# Patient Record
Sex: Male | Born: 1945
Health system: Southern US, Community
[De-identification: ages and names within clinical notes are randomized; demographics above are authoritative.]

## PROBLEM LIST (undated history)

## (undated) DIAGNOSIS — T8859XA Other complications of anesthesia, initial encounter: Secondary | ICD-10-CM

## (undated) DIAGNOSIS — C8590 Non-Hodgkin lymphoma, unspecified, unspecified site: Secondary | ICD-10-CM

## (undated) DIAGNOSIS — W3400XA Accidental discharge from unspecified firearms or gun, initial encounter: Secondary | ICD-10-CM

## (undated) DIAGNOSIS — I10 Essential (primary) hypertension: Secondary | ICD-10-CM

## (undated) DIAGNOSIS — T4145XA Adverse effect of unspecified anesthetic, initial encounter: Secondary | ICD-10-CM

## (undated) HISTORY — PX: OTHER SURGICAL HISTORY: SHX169

## (undated) HISTORY — PX: THORACENTESIS: SHX235

## (undated) HISTORY — DX: Essential (primary) hypertension: I10

---

## 2004-12-04 ENCOUNTER — Encounter: Admission: RE | Admit: 2004-12-04 | Discharge: 2004-12-04 | Payer: Self-pay | Admitting: Emergency Medicine

## 2005-11-23 ENCOUNTER — Encounter: Admission: RE | Admit: 2005-11-23 | Discharge: 2005-11-23 | Payer: Self-pay | Admitting: Family Medicine

## 2005-11-24 ENCOUNTER — Encounter (INDEPENDENT_AMBULATORY_CARE_PROVIDER_SITE_OTHER): Payer: Self-pay | Admitting: Specialist

## 2005-11-24 ENCOUNTER — Ambulatory Visit (HOSPITAL_COMMUNITY): Admission: RE | Admit: 2005-11-24 | Discharge: 2005-11-24 | Payer: Self-pay | Admitting: Obstetrics and Gynecology

## 2005-11-26 ENCOUNTER — Encounter: Admission: RE | Admit: 2005-11-26 | Discharge: 2005-11-26 | Payer: Self-pay | Admitting: Family Medicine

## 2005-12-04 ENCOUNTER — Ambulatory Visit: Payer: Self-pay | Admitting: Internal Medicine

## 2005-12-11 ENCOUNTER — Encounter: Payer: Self-pay | Admitting: Internal Medicine

## 2005-12-11 ENCOUNTER — Other Ambulatory Visit: Admission: RE | Admit: 2005-12-11 | Discharge: 2005-12-11 | Payer: Self-pay | Admitting: Internal Medicine

## 2005-12-11 ENCOUNTER — Ambulatory Visit: Payer: Self-pay | Admitting: Internal Medicine

## 2005-12-11 ENCOUNTER — Encounter (INDEPENDENT_AMBULATORY_CARE_PROVIDER_SITE_OTHER): Payer: Self-pay | Admitting: Specialist

## 2005-12-31 ENCOUNTER — Inpatient Hospital Stay (HOSPITAL_COMMUNITY)
Admission: RE | Admit: 2005-12-31 | Discharge: 2006-01-04 | Payer: Self-pay | Admitting: Thoracic Surgery (Cardiothoracic Vascular Surgery)

## 2005-12-31 ENCOUNTER — Encounter (INDEPENDENT_AMBULATORY_CARE_PROVIDER_SITE_OTHER): Payer: Self-pay | Admitting: *Deleted

## 2006-01-15 ENCOUNTER — Ambulatory Visit: Payer: Self-pay | Admitting: Oncology

## 2006-01-15 LAB — CBC WITH DIFFERENTIAL/PLATELET
BASO%: 0.5 % (ref 0.0–2.0)
Basophils Absolute: 0 10*3/uL (ref 0.0–0.1)
EOS%: 2.6 % (ref 0.0–7.0)
Eosinophils Absolute: 0.2 10*3/uL (ref 0.0–0.5)
HCT: 43 % (ref 38.7–49.9)
HGB: 14.9 g/dL (ref 13.0–17.1)
LYMPH%: 13.7 % — ABNORMAL LOW (ref 14.0–48.0)
MCH: 30.4 pg (ref 28.0–33.4)
MCHC: 34.6 g/dL (ref 32.0–35.9)
MCV: 87.9 fL (ref 81.6–98.0)
MONO#: 0.3 10*3/uL (ref 0.1–0.9)
MONO%: 5.3 % (ref 0.0–13.0)
NEUT#: 5 10*3/uL (ref 1.5–6.5)
NEUT%: 77.9 % — ABNORMAL HIGH (ref 40.0–75.0)
Platelets: 372 10*3/uL (ref 145–400)
RBC: 4.9 10*6/uL (ref 4.20–5.71)
RDW: 13.1 % (ref 11.2–14.6)
WBC: 6.5 10*3/uL (ref 4.0–10.0)
lymph#: 0.9 10*3/uL (ref 0.9–3.3)

## 2006-01-15 LAB — COMPREHENSIVE METABOLIC PANEL
ALT: 24 U/L (ref 0–40)
AST: 16 U/L (ref 0–37)
Albumin: 3.8 g/dL (ref 3.5–5.2)
Alkaline Phosphatase: 68 U/L (ref 39–117)
BUN: 13 mg/dL (ref 6–23)
CO2: 26 mEq/L (ref 19–32)
Calcium: 9.5 mg/dL (ref 8.4–10.5)
Chloride: 102 mEq/L (ref 96–112)
Creatinine, Ser: 0.92 mg/dL (ref 0.40–1.50)
Glucose, Bld: 91 mg/dL (ref 70–99)
Potassium: 4.6 mEq/L (ref 3.5–5.3)
Sodium: 142 mEq/L (ref 135–145)
Total Bilirubin: 0.5 mg/dL (ref 0.3–1.2)
Total Protein: 6.2 g/dL (ref 6.0–8.3)

## 2006-01-18 ENCOUNTER — Encounter
Admission: RE | Admit: 2006-01-18 | Discharge: 2006-01-18 | Payer: Self-pay | Admitting: Thoracic Surgery (Cardiothoracic Vascular Surgery)

## 2006-01-29 ENCOUNTER — Ambulatory Visit (HOSPITAL_COMMUNITY): Admission: RE | Admit: 2006-01-29 | Discharge: 2006-01-29 | Payer: Self-pay | Admitting: Oncology

## 2006-02-03 ENCOUNTER — Encounter: Payer: Self-pay | Admitting: Oncology

## 2006-02-03 ENCOUNTER — Ambulatory Visit: Payer: Self-pay | Admitting: Oncology

## 2006-02-03 ENCOUNTER — Encounter (INDEPENDENT_AMBULATORY_CARE_PROVIDER_SITE_OTHER): Payer: Self-pay | Admitting: Specialist

## 2006-02-03 ENCOUNTER — Ambulatory Visit (HOSPITAL_COMMUNITY): Admission: RE | Admit: 2006-02-03 | Discharge: 2006-02-03 | Payer: Self-pay | Admitting: Oncology

## 2006-02-09 LAB — CBC WITH DIFFERENTIAL/PLATELET
BASO%: 0.6 % (ref 0.0–2.0)
Basophils Absolute: 0 10*3/uL (ref 0.0–0.1)
EOS%: 2.4 % (ref 0.0–7.0)
Eosinophils Absolute: 0.2 10*3/uL (ref 0.0–0.5)
HCT: 42.5 % (ref 38.7–49.9)
HGB: 14.9 g/dL (ref 13.0–17.1)
LYMPH%: 13.6 % — ABNORMAL LOW (ref 14.0–48.0)
MCH: 30.5 pg (ref 28.0–33.4)
MCHC: 35 g/dL (ref 32.0–35.9)
MCV: 87.1 fL (ref 81.6–98.0)
MONO#: 0.5 10*3/uL (ref 0.1–0.9)
MONO%: 7.1 % (ref 0.0–13.0)
NEUT#: 5 10*3/uL (ref 1.5–6.5)
NEUT%: 76.3 % — ABNORMAL HIGH (ref 40.0–75.0)
Platelets: 236 10*3/uL (ref 145–400)
RBC: 4.87 10*6/uL (ref 4.20–5.71)
RDW: 13.5 % (ref 11.2–14.6)
WBC: 6.6 10*3/uL (ref 4.0–10.0)
lymph#: 0.9 10*3/uL (ref 0.9–3.3)

## 2006-02-09 LAB — COMPREHENSIVE METABOLIC PANEL
ALT: 15 U/L (ref 0–40)
AST: 14 U/L (ref 0–37)
Albumin: 4 g/dL (ref 3.5–5.2)
Alkaline Phosphatase: 56 U/L (ref 39–117)
BUN: 16 mg/dL (ref 6–23)
CO2: 30 mEq/L (ref 19–32)
Calcium: 9.4 mg/dL (ref 8.4–10.5)
Chloride: 103 mEq/L (ref 96–112)
Creatinine, Ser: 0.9 mg/dL (ref 0.40–1.50)
Glucose, Bld: 90 mg/dL (ref 70–99)
Potassium: 4.1 mEq/L (ref 3.5–5.3)
Sodium: 142 mEq/L (ref 135–145)
Total Bilirubin: 0.5 mg/dL (ref 0.3–1.2)
Total Protein: 6.4 g/dL (ref 6.0–8.3)

## 2006-02-19 LAB — CBC WITH DIFFERENTIAL/PLATELET
BASO%: 0.9 % (ref 0.0–2.0)
Basophils Absolute: 0 10*3/uL (ref 0.0–0.1)
EOS%: 2.4 % (ref 0.0–7.0)
Eosinophils Absolute: 0.1 10*3/uL (ref 0.0–0.5)
HCT: 41.9 % (ref 38.7–49.9)
HGB: 15.1 g/dL (ref 13.0–17.1)
LYMPH%: 14.7 % (ref 14.0–48.0)
MCH: 30.3 pg (ref 28.0–33.4)
MCHC: 36 g/dL — ABNORMAL HIGH (ref 32.0–35.9)
MCV: 84.1 fL (ref 81.6–98.0)
MONO#: 0.3 10*3/uL (ref 0.1–0.9)
MONO%: 6.3 % (ref 0.0–13.0)
NEUT#: 3.7 10*3/uL (ref 1.5–6.5)
NEUT%: 75.6 % — ABNORMAL HIGH (ref 40.0–75.0)
Platelets: 228 10*3/uL (ref 145–400)
RBC: 4.97 10*6/uL (ref 4.20–5.71)
RDW: 11.9 % (ref 11.2–14.6)
WBC: 4.9 10*3/uL (ref 4.0–10.0)
lymph#: 0.7 10*3/uL — ABNORMAL LOW (ref 0.9–3.3)

## 2006-02-19 LAB — COMPREHENSIVE METABOLIC PANEL
ALT: 19 U/L (ref 0–40)
AST: 16 U/L (ref 0–37)
Albumin: 4.1 g/dL (ref 3.5–5.2)
Alkaline Phosphatase: 54 U/L (ref 39–117)
BUN: 19 mg/dL (ref 6–23)
CO2: 26 mEq/L (ref 19–32)
Calcium: 9.3 mg/dL (ref 8.4–10.5)
Chloride: 105 mEq/L (ref 96–112)
Creatinine, Ser: 0.95 mg/dL (ref 0.40–1.50)
Glucose, Bld: 84 mg/dL (ref 70–99)
Potassium: 4.1 mEq/L (ref 3.5–5.3)
Sodium: 141 mEq/L (ref 135–145)
Total Bilirubin: 0.6 mg/dL (ref 0.3–1.2)
Total Protein: 6.1 g/dL (ref 6.0–8.3)

## 2006-03-08 ENCOUNTER — Ambulatory Visit: Payer: Self-pay | Admitting: Oncology

## 2006-04-01 LAB — COMPREHENSIVE METABOLIC PANEL
ALT: 22 U/L (ref 0–40)
AST: 14 U/L (ref 0–37)
Albumin: 4.1 g/dL (ref 3.5–5.2)
Alkaline Phosphatase: 43 U/L (ref 39–117)
BUN: 16 mg/dL (ref 6–23)
CO2: 28 mEq/L (ref 19–32)
Calcium: 9.3 mg/dL (ref 8.4–10.5)
Chloride: 102 mEq/L (ref 96–112)
Creatinine, Ser: 0.93 mg/dL (ref 0.40–1.50)
Glucose, Bld: 106 mg/dL — ABNORMAL HIGH (ref 70–99)
Potassium: 3.5 mEq/L (ref 3.5–5.3)
Sodium: 139 mEq/L (ref 135–145)
Total Bilirubin: 0.5 mg/dL (ref 0.3–1.2)
Total Protein: 6 g/dL (ref 6.0–8.3)

## 2006-04-01 LAB — CBC WITH DIFFERENTIAL/PLATELET
BASO%: 0.6 % (ref 0.0–2.0)
Basophils Absolute: 0 10*3/uL (ref 0.0–0.1)
EOS%: 2.8 % (ref 0.0–7.0)
Eosinophils Absolute: 0.1 10*3/uL (ref 0.0–0.5)
HCT: 40.2 % (ref 38.7–49.9)
HGB: 14.2 g/dL (ref 13.0–17.1)
LYMPH%: 17.5 % (ref 14.0–48.0)
MCH: 30.6 pg (ref 28.0–33.4)
MCHC: 35.4 g/dL (ref 32.0–35.9)
MCV: 86.4 fL (ref 81.6–98.0)
MONO#: 0.3 10*3/uL (ref 0.1–0.9)
MONO%: 8.6 % (ref 0.0–13.0)
NEUT#: 2.8 10*3/uL (ref 1.5–6.5)
NEUT%: 70.5 % (ref 40.0–75.0)
Platelets: 294 10*3/uL (ref 145–400)
RBC: 4.65 10*6/uL (ref 4.20–5.71)
RDW: 15.3 % — ABNORMAL HIGH (ref 11.2–14.6)
WBC: 4 10*3/uL (ref 4.0–10.0)
lymph#: 0.7 10*3/uL — ABNORMAL LOW (ref 0.9–3.3)

## 2006-04-01 LAB — LACTATE DEHYDROGENASE: LDH: 164 U/L (ref 94–250)

## 2006-04-20 ENCOUNTER — Ambulatory Visit: Payer: Self-pay | Admitting: Oncology

## 2006-04-22 LAB — COMPREHENSIVE METABOLIC PANEL WITH GFR
ALT: 21 U/L (ref 0–53)
AST: 14 U/L (ref 0–37)
Albumin: 4.1 g/dL (ref 3.5–5.2)
Alkaline Phosphatase: 43 U/L (ref 39–117)
BUN: 16 mg/dL (ref 6–23)
CO2: 29 meq/L (ref 19–32)
Calcium: 9.2 mg/dL (ref 8.4–10.5)
Chloride: 101 meq/L (ref 96–112)
Creatinine, Ser: 0.98 mg/dL (ref 0.40–1.50)
Glucose, Bld: 88 mg/dL (ref 70–99)
Potassium: 3.3 meq/L — ABNORMAL LOW (ref 3.5–5.3)
Sodium: 137 meq/L (ref 135–145)
Total Bilirubin: 0.7 mg/dL (ref 0.3–1.2)
Total Protein: 6.2 g/dL (ref 6.0–8.3)

## 2006-04-22 LAB — CBC WITH DIFFERENTIAL/PLATELET
BASO%: 0.4 % (ref 0.0–2.0)
Basophils Absolute: 0 10e3/uL (ref 0.0–0.1)
EOS%: 2.8 % (ref 0.0–7.0)
Eosinophils Absolute: 0.1 10e3/uL (ref 0.0–0.5)
HCT: 39.8 % (ref 38.7–49.9)
HGB: 14.3 g/dL (ref 13.0–17.1)
LYMPH%: 18.9 % (ref 14.0–48.0)
MCH: 31.1 pg (ref 28.0–33.4)
MCHC: 35.8 g/dL (ref 32.0–35.9)
MCV: 86.8 fL (ref 81.6–98.0)
MONO#: 0.5 10e3/uL (ref 0.1–0.9)
MONO%: 11.6 % (ref 0.0–13.0)
NEUT#: 2.6 10e3/uL (ref 1.5–6.5)
NEUT%: 66.3 % (ref 40.0–75.0)
Platelets: 264 10e3/uL (ref 145–400)
RBC: 4.59 10e6/uL (ref 4.20–5.71)
RDW: 15.4 % — ABNORMAL HIGH (ref 11.2–14.6)
WBC: 4 10e3/uL (ref 4.0–10.0)
lymph#: 0.8 10e3/uL — ABNORMAL LOW (ref 0.9–3.3)

## 2006-05-12 ENCOUNTER — Ambulatory Visit (HOSPITAL_COMMUNITY): Admission: RE | Admit: 2006-05-12 | Discharge: 2006-05-12 | Payer: Self-pay | Admitting: Oncology

## 2006-05-13 LAB — COMPREHENSIVE METABOLIC PANEL
ALT: 26 U/L (ref 0–53)
AST: 14 U/L (ref 0–37)
Albumin: 4.2 g/dL (ref 3.5–5.2)
Alkaline Phosphatase: 46 U/L (ref 39–117)
BUN: 16 mg/dL (ref 6–23)
CO2: 29 mEq/L (ref 19–32)
Calcium: 9.5 mg/dL (ref 8.4–10.5)
Chloride: 103 mEq/L (ref 96–112)
Creatinine, Ser: 0.96 mg/dL (ref 0.40–1.50)
Glucose, Bld: 117 mg/dL — ABNORMAL HIGH (ref 70–99)
Potassium: 3.9 mEq/L (ref 3.5–5.3)
Sodium: 141 mEq/L (ref 135–145)
Total Bilirubin: 0.7 mg/dL (ref 0.3–1.2)
Total Protein: 6.1 g/dL (ref 6.0–8.3)

## 2006-05-13 LAB — CBC WITH DIFFERENTIAL/PLATELET
BASO%: 0.2 % (ref 0.0–2.0)
Basophils Absolute: 0 10*3/uL (ref 0.0–0.1)
EOS%: 3 % (ref 0.0–7.0)
Eosinophils Absolute: 0.1 10*3/uL (ref 0.0–0.5)
HCT: 42.9 % (ref 38.7–49.9)
HGB: 15.1 g/dL (ref 13.0–17.1)
LYMPH%: 19.2 % (ref 14.0–48.0)
MCH: 31.3 pg (ref 28.0–33.4)
MCHC: 35.3 g/dL (ref 32.0–35.9)
MCV: 88.6 fL (ref 81.6–98.0)
MONO#: 0.4 10*3/uL (ref 0.1–0.9)
MONO%: 11.1 % (ref 0.0–13.0)
NEUT#: 2.3 10*3/uL (ref 1.5–6.5)
NEUT%: 66.5 % (ref 40.0–75.0)
Platelets: 269 10*3/uL (ref 145–400)
RBC: 4.84 10*6/uL (ref 4.20–5.71)
RDW: 14.9 % — ABNORMAL HIGH (ref 11.2–14.6)
WBC: 3.4 10*3/uL — ABNORMAL LOW (ref 4.0–10.0)
lymph#: 0.7 10*3/uL — ABNORMAL LOW (ref 0.9–3.3)

## 2006-05-13 LAB — LACTATE DEHYDROGENASE: LDH: 177 U/L (ref 94–250)

## 2006-05-30 ENCOUNTER — Ambulatory Visit: Payer: Self-pay | Admitting: Oncology

## 2006-06-03 LAB — COMPREHENSIVE METABOLIC PANEL
ALT: 23 U/L (ref 0–53)
AST: 15 U/L (ref 0–37)
Albumin: 4.3 g/dL (ref 3.5–5.2)
Alkaline Phosphatase: 45 U/L (ref 39–117)
BUN: 15 mg/dL (ref 6–23)
CO2: 28 mEq/L (ref 19–32)
Calcium: 9.1 mg/dL (ref 8.4–10.5)
Chloride: 101 mEq/L (ref 96–112)
Creatinine, Ser: 1.05 mg/dL (ref 0.40–1.50)
Glucose, Bld: 110 mg/dL — ABNORMAL HIGH (ref 70–99)
Potassium: 4.1 mEq/L (ref 3.5–5.3)
Sodium: 140 mEq/L (ref 135–145)
Total Bilirubin: 0.8 mg/dL (ref 0.3–1.2)
Total Protein: 6.4 g/dL (ref 6.0–8.3)

## 2006-06-03 LAB — CBC WITH DIFFERENTIAL/PLATELET
BASO%: 0.3 % (ref 0.0–2.0)
Basophils Absolute: 0 10*3/uL (ref 0.0–0.1)
EOS%: 1.8 % (ref 0.0–7.0)
Eosinophils Absolute: 0.1 10*3/uL (ref 0.0–0.5)
HCT: 41.6 % (ref 38.7–49.9)
HGB: 14.6 g/dL (ref 13.0–17.1)
LYMPH%: 10.3 % — ABNORMAL LOW (ref 14.0–48.0)
MCH: 31.6 pg (ref 28.0–33.4)
MCHC: 35.2 g/dL (ref 32.0–35.9)
MCV: 89.9 fL (ref 81.6–98.0)
MONO#: 0.8 10*3/uL (ref 0.1–0.9)
MONO%: 10.2 % (ref 0.0–13.0)
NEUT#: 5.9 10*3/uL (ref 1.5–6.5)
NEUT%: 77.4 % — ABNORMAL HIGH (ref 40.0–75.0)
Platelets: 266 10*3/uL (ref 145–400)
RBC: 4.63 10*6/uL (ref 4.20–5.71)
RDW: 14.2 % (ref 11.2–14.6)
WBC: 7.6 10*3/uL (ref 4.0–10.0)
lymph#: 0.8 10*3/uL — ABNORMAL LOW (ref 0.9–3.3)

## 2006-06-21 ENCOUNTER — Ambulatory Visit (HOSPITAL_COMMUNITY): Admission: RE | Admit: 2006-06-21 | Discharge: 2006-06-21 | Payer: Self-pay | Admitting: Oncology

## 2006-06-25 LAB — CBC WITH DIFFERENTIAL/PLATELET
BASO%: 0.3 % (ref 0.0–2.0)
Basophils Absolute: 0 10*3/uL (ref 0.0–0.1)
EOS%: 1.7 % (ref 0.0–7.0)
Eosinophils Absolute: 0.1 10*3/uL (ref 0.0–0.5)
HCT: 41.1 % (ref 38.7–49.9)
HGB: 14.2 g/dL (ref 13.0–17.1)
LYMPH%: 14.3 % (ref 14.0–48.0)
MCH: 31.3 pg (ref 28.0–33.4)
MCHC: 34.5 g/dL (ref 32.0–35.9)
MCV: 90.4 fL (ref 81.6–98.0)
MONO#: 0.3 10*3/uL (ref 0.1–0.9)
MONO%: 5.8 % (ref 0.0–13.0)
NEUT#: 3.7 10*3/uL (ref 1.5–6.5)
NEUT%: 77.9 % — ABNORMAL HIGH (ref 40.0–75.0)
Platelets: 296 10*3/uL (ref 145–400)
RBC: 4.54 10*6/uL (ref 4.20–5.71)
RDW: 14.1 % (ref 11.2–14.6)
WBC: 4.7 10*3/uL (ref 4.0–10.0)
lymph#: 0.7 10*3/uL — ABNORMAL LOW (ref 0.9–3.3)

## 2006-06-25 LAB — COMPREHENSIVE METABOLIC PANEL
ALT: 24 U/L (ref 0–53)
AST: 13 U/L (ref 0–37)
Albumin: 4.2 g/dL (ref 3.5–5.2)
Alkaline Phosphatase: 47 U/L (ref 39–117)
BUN: 15 mg/dL (ref 6–23)
CO2: 27 mEq/L (ref 19–32)
Calcium: 9.2 mg/dL (ref 8.4–10.5)
Chloride: 102 mEq/L (ref 96–112)
Creatinine, Ser: 1.02 mg/dL (ref 0.40–1.50)
Glucose, Bld: 158 mg/dL — ABNORMAL HIGH (ref 70–99)
Potassium: 3.9 mEq/L (ref 3.5–5.3)
Sodium: 138 mEq/L (ref 135–145)
Total Bilirubin: 0.7 mg/dL (ref 0.3–1.2)
Total Protein: 6.2 g/dL (ref 6.0–8.3)

## 2006-06-25 LAB — LACTATE DEHYDROGENASE: LDH: 207 U/L (ref 94–250)

## 2006-09-28 ENCOUNTER — Ambulatory Visit: Payer: Self-pay | Admitting: Oncology

## 2006-10-01 LAB — COMPREHENSIVE METABOLIC PANEL
ALT: 17 U/L (ref 0–53)
AST: 12 U/L (ref 0–37)
Albumin: 4.2 g/dL (ref 3.5–5.2)
Alkaline Phosphatase: 44 U/L (ref 39–117)
BUN: 15 mg/dL (ref 6–23)
CO2: 28 mEq/L (ref 19–32)
Calcium: 8.6 mg/dL (ref 8.4–10.5)
Chloride: 104 mEq/L (ref 96–112)
Creatinine, Ser: 0.93 mg/dL (ref 0.40–1.50)
Glucose, Bld: 94 mg/dL (ref 70–99)
Potassium: 3.6 mEq/L (ref 3.5–5.3)
Sodium: 141 mEq/L (ref 135–145)
Total Bilirubin: 0.8 mg/dL (ref 0.3–1.2)
Total Protein: 6.1 g/dL (ref 6.0–8.3)

## 2006-10-01 LAB — CBC WITH DIFFERENTIAL/PLATELET
BASO%: 1.1 % (ref 0.0–2.0)
Basophils Absolute: 0 10*3/uL (ref 0.0–0.1)
EOS%: 1.9 % (ref 0.0–7.0)
Eosinophils Absolute: 0.1 10*3/uL (ref 0.0–0.5)
HCT: 42.3 % (ref 38.7–49.9)
HGB: 15.4 g/dL (ref 13.0–17.1)
LYMPH%: 23.9 % (ref 14.0–48.0)
MCH: 31.6 pg (ref 28.0–33.4)
MCHC: 36.4 g/dL — ABNORMAL HIGH (ref 32.0–35.9)
MCV: 86.7 fL (ref 81.6–98.0)
MONO#: 0.4 10*3/uL (ref 0.1–0.9)
MONO%: 9.7 % (ref 0.0–13.0)
NEUT#: 2.5 10*3/uL (ref 1.5–6.5)
NEUT%: 63.4 % (ref 40.0–75.0)
Platelets: 213 10*3/uL (ref 145–400)
RBC: 4.88 10*6/uL (ref 4.20–5.71)
RDW: 13.4 % (ref 11.2–14.6)
WBC: 3.9 10*3/uL — ABNORMAL LOW (ref 4.0–10.0)
lymph#: 0.9 10*3/uL (ref 0.9–3.3)

## 2006-10-01 LAB — LACTATE DEHYDROGENASE: LDH: 142 U/L (ref 94–250)

## 2006-12-27 ENCOUNTER — Ambulatory Visit (HOSPITAL_COMMUNITY): Admission: RE | Admit: 2006-12-27 | Discharge: 2006-12-27 | Payer: Self-pay | Admitting: Oncology

## 2006-12-28 ENCOUNTER — Ambulatory Visit: Payer: Self-pay | Admitting: Oncology

## 2006-12-31 LAB — CBC WITH DIFFERENTIAL/PLATELET
BASO%: 0.6 % (ref 0.0–2.0)
Basophils Absolute: 0 10*3/uL (ref 0.0–0.1)
EOS%: 2.4 % (ref 0.0–7.0)
Eosinophils Absolute: 0.1 10*3/uL (ref 0.0–0.5)
HCT: 43.7 % (ref 38.7–49.9)
HGB: 15.7 g/dL (ref 13.0–17.1)
LYMPH%: 21.3 % (ref 14.0–48.0)
MCH: 31.6 pg (ref 28.0–33.4)
MCHC: 35.8 g/dL (ref 32.0–35.9)
MCV: 88.1 fL (ref 81.6–98.0)
MONO#: 0.3 10*3/uL (ref 0.1–0.9)
MONO%: 8 % (ref 0.0–13.0)
NEUT#: 2.7 10*3/uL (ref 1.5–6.5)
NEUT%: 67.7 % (ref 40.0–75.0)
Platelets: 211 10*3/uL (ref 145–400)
RBC: 4.96 10*6/uL (ref 4.20–5.71)
RDW: 13.8 % (ref 11.2–14.6)
WBC: 4 10*3/uL (ref 4.0–10.0)
lymph#: 0.8 10*3/uL — ABNORMAL LOW (ref 0.9–3.3)

## 2006-12-31 LAB — COMPREHENSIVE METABOLIC PANEL
ALT: 16 U/L (ref 0–53)
AST: 15 U/L (ref 0–37)
Albumin: 4.3 g/dL (ref 3.5–5.2)
Alkaline Phosphatase: 45 U/L (ref 39–117)
BUN: 14 mg/dL (ref 6–23)
CO2: 28 mEq/L (ref 19–32)
Calcium: 9.6 mg/dL (ref 8.4–10.5)
Chloride: 104 mEq/L (ref 96–112)
Creatinine, Ser: 0.93 mg/dL (ref 0.40–1.50)
Glucose, Bld: 95 mg/dL (ref 70–99)
Potassium: 4.3 mEq/L (ref 3.5–5.3)
Sodium: 142 mEq/L (ref 135–145)
Total Bilirubin: 0.8 mg/dL (ref 0.3–1.2)
Total Protein: 6.4 g/dL (ref 6.0–8.3)

## 2006-12-31 LAB — LACTATE DEHYDROGENASE: LDH: 141 U/L (ref 94–250)

## 2007-04-05 ENCOUNTER — Ambulatory Visit: Payer: Self-pay | Admitting: Oncology

## 2007-04-07 LAB — CBC WITH DIFFERENTIAL/PLATELET
BASO%: 0.2 % (ref 0.0–2.0)
Basophils Absolute: 0 10*3/uL (ref 0.0–0.1)
EOS%: 1.9 % (ref 0.0–7.0)
Eosinophils Absolute: 0.1 10*3/uL (ref 0.0–0.5)
HCT: 44.3 % (ref 38.7–49.9)
HGB: 15.7 g/dL (ref 13.0–17.1)
LYMPH%: 17 % (ref 14.0–48.0)
MCH: 31.4 pg (ref 28.0–33.4)
MCHC: 35.5 g/dL (ref 32.0–35.9)
MCV: 88.5 fL (ref 81.6–98.0)
MONO#: 0.6 10*3/uL (ref 0.1–0.9)
MONO%: 9.6 % (ref 0.0–13.0)
NEUT#: 4.1 10*3/uL (ref 1.5–6.5)
NEUT%: 71.3 % (ref 40.0–75.0)
Platelets: 201 10*3/uL (ref 145–400)
RBC: 5 10*6/uL (ref 4.20–5.71)
RDW: 13.4 % (ref 11.2–14.6)
WBC: 5.8 10*3/uL (ref 4.0–10.0)
lymph#: 1 10*3/uL (ref 0.9–3.3)

## 2007-04-07 LAB — COMPREHENSIVE METABOLIC PANEL
ALT: 17 U/L (ref 0–53)
AST: 15 U/L (ref 0–37)
Albumin: 4.2 g/dL (ref 3.5–5.2)
Alkaline Phosphatase: 46 U/L (ref 39–117)
BUN: 15 mg/dL (ref 6–23)
CO2: 27 mEq/L (ref 19–32)
Calcium: 9.4 mg/dL (ref 8.4–10.5)
Chloride: 101 mEq/L (ref 96–112)
Creatinine, Ser: 0.94 mg/dL (ref 0.40–1.50)
Glucose, Bld: 97 mg/dL (ref 70–99)
Potassium: 4.2 mEq/L (ref 3.5–5.3)
Sodium: 139 mEq/L (ref 135–145)
Total Bilirubin: 0.7 mg/dL (ref 0.3–1.2)
Total Protein: 6.3 g/dL (ref 6.0–8.3)

## 2007-04-07 LAB — LACTATE DEHYDROGENASE: LDH: 149 U/L (ref 94–250)

## 2007-07-08 ENCOUNTER — Ambulatory Visit (HOSPITAL_COMMUNITY): Admission: RE | Admit: 2007-07-08 | Discharge: 2007-07-08 | Payer: Self-pay | Admitting: Oncology

## 2007-07-12 ENCOUNTER — Ambulatory Visit: Payer: Self-pay | Admitting: Oncology

## 2007-07-14 LAB — CBC WITH DIFFERENTIAL/PLATELET
BASO%: 1.1 % (ref 0.0–2.0)
Basophils Absolute: 0.1 10*3/uL (ref 0.0–0.1)
EOS%: 2.1 % (ref 0.0–7.0)
Eosinophils Absolute: 0.1 10*3/uL (ref 0.0–0.5)
HCT: 44.9 % (ref 38.7–49.9)
HGB: 16 g/dL (ref 13.0–17.1)
LYMPH%: 21.4 % (ref 14.0–48.0)
MCH: 31.4 pg (ref 28.0–33.4)
MCHC: 35.7 g/dL (ref 32.0–35.9)
MCV: 88.1 fL (ref 81.6–98.0)
MONO#: 0.3 10*3/uL (ref 0.1–0.9)
MONO%: 6 % (ref 0.0–13.0)
NEUT#: 3.3 10*3/uL (ref 1.5–6.5)
NEUT%: 69.4 % (ref 40.0–75.0)
Platelets: 235 10*3/uL (ref 145–400)
RBC: 5.1 10*6/uL (ref 4.20–5.71)
RDW: 13.1 % (ref 11.2–14.6)
WBC: 4.7 10*3/uL (ref 4.0–10.0)
lymph#: 1 10*3/uL (ref 0.9–3.3)

## 2007-07-14 LAB — COMPREHENSIVE METABOLIC PANEL
ALT: 17 U/L (ref 0–53)
AST: 15 U/L (ref 0–37)
Albumin: 4.7 g/dL (ref 3.5–5.2)
Alkaline Phosphatase: 46 U/L (ref 39–117)
BUN: 15 mg/dL (ref 6–23)
CO2: 28 mEq/L (ref 19–32)
Calcium: 9.4 mg/dL (ref 8.4–10.5)
Chloride: 102 mEq/L (ref 96–112)
Creatinine, Ser: 1.01 mg/dL (ref 0.40–1.50)
Glucose, Bld: 108 mg/dL — ABNORMAL HIGH (ref 70–99)
Potassium: 4 mEq/L (ref 3.5–5.3)
Sodium: 140 mEq/L (ref 135–145)
Total Bilirubin: 1 mg/dL (ref 0.3–1.2)
Total Protein: 7 g/dL (ref 6.0–8.3)

## 2007-07-14 LAB — LACTATE DEHYDROGENASE: LDH: 149 U/L (ref 94–250)

## 2007-11-09 ENCOUNTER — Ambulatory Visit: Payer: Self-pay | Admitting: Oncology

## 2007-11-11 LAB — CBC WITH DIFFERENTIAL/PLATELET
BASO%: 0.6 % (ref 0.0–2.0)
Basophils Absolute: 0 10*3/uL (ref 0.0–0.1)
EOS%: 2.5 % (ref 0.0–7.0)
Eosinophils Absolute: 0.1 10*3/uL (ref 0.0–0.5)
HCT: 43.6 % (ref 38.7–49.9)
HGB: 15.5 g/dL (ref 13.0–17.1)
LYMPH%: 21.6 % (ref 14.0–48.0)
MCH: 31.3 pg (ref 28.0–33.4)
MCHC: 35.5 g/dL (ref 32.0–35.9)
MCV: 88 fL (ref 81.6–98.0)
MONO#: 0.4 10*3/uL (ref 0.1–0.9)
MONO%: 6.9 % (ref 0.0–13.0)
NEUT#: 3.6 10*3/uL (ref 1.5–6.5)
NEUT%: 68.4 % (ref 40.0–75.0)
Platelets: 210 10*3/uL (ref 145–400)
RBC: 4.95 10*6/uL (ref 4.20–5.71)
RDW: 13.2 % (ref 11.2–14.6)
WBC: 5.2 10*3/uL (ref 4.0–10.0)
lymph#: 1.1 10*3/uL (ref 0.9–3.3)

## 2007-11-11 LAB — COMPREHENSIVE METABOLIC PANEL
ALT: 14 U/L (ref 0–53)
AST: 11 U/L (ref 0–37)
Albumin: 4.2 g/dL (ref 3.5–5.2)
Alkaline Phosphatase: 45 U/L (ref 39–117)
BUN: 15 mg/dL (ref 6–23)
CO2: 27 mEq/L (ref 19–32)
Calcium: 9.5 mg/dL (ref 8.4–10.5)
Chloride: 106 mEq/L (ref 96–112)
Creatinine, Ser: 1 mg/dL (ref 0.40–1.50)
Glucose, Bld: 101 mg/dL — ABNORMAL HIGH (ref 70–99)
Potassium: 4.4 mEq/L (ref 3.5–5.3)
Sodium: 141 mEq/L (ref 135–145)
Total Bilirubin: 0.8 mg/dL (ref 0.3–1.2)
Total Protein: 6.4 g/dL (ref 6.0–8.3)

## 2007-11-11 LAB — LACTATE DEHYDROGENASE: LDH: 148 U/L (ref 94–250)

## 2008-03-07 ENCOUNTER — Ambulatory Visit: Payer: Self-pay | Admitting: Oncology

## 2008-03-09 LAB — CBC WITH DIFFERENTIAL/PLATELET
BASO%: 0.5 % (ref 0.0–2.0)
Basophils Absolute: 0 10*3/uL (ref 0.0–0.1)
EOS%: 2.5 % (ref 0.0–7.0)
Eosinophils Absolute: 0.1 10*3/uL (ref 0.0–0.5)
HCT: 44.4 % (ref 38.7–49.9)
HGB: 15.6 g/dL (ref 13.0–17.1)
LYMPH%: 20.1 % (ref 14.0–48.0)
MCH: 31.9 pg (ref 28.0–33.4)
MCHC: 35.2 g/dL (ref 32.0–35.9)
MCV: 90.6 fL (ref 81.6–98.0)
MONO#: 0.3 10*3/uL (ref 0.1–0.9)
MONO%: 6.3 % (ref 0.0–13.0)
NEUT#: 3.3 10*3/uL (ref 1.5–6.5)
NEUT%: 70.6 % (ref 40.0–75.0)
Platelets: 202 10*3/uL (ref 145–400)
RBC: 4.9 10*6/uL (ref 4.20–5.71)
RDW: 13.6 % (ref 11.2–14.6)
WBC: 4.7 10*3/uL (ref 4.0–10.0)
lymph#: 1 10*3/uL (ref 0.9–3.3)

## 2008-03-09 LAB — COMPREHENSIVE METABOLIC PANEL
ALT: 23 U/L (ref 0–53)
AST: 16 U/L (ref 0–37)
Albumin: 4.3 g/dL (ref 3.5–5.2)
Alkaline Phosphatase: 51 U/L (ref 39–117)
BUN: 16 mg/dL (ref 6–23)
CO2: 29 mEq/L (ref 19–32)
Calcium: 9.3 mg/dL (ref 8.4–10.5)
Chloride: 106 mEq/L (ref 96–112)
Creatinine, Ser: 1.04 mg/dL (ref 0.40–1.50)
Glucose, Bld: 121 mg/dL — ABNORMAL HIGH (ref 70–99)
Potassium: 4.4 mEq/L (ref 3.5–5.3)
Sodium: 144 mEq/L (ref 135–145)
Total Bilirubin: 0.8 mg/dL (ref 0.3–1.2)
Total Protein: 6.5 g/dL (ref 6.0–8.3)

## 2008-03-09 LAB — LACTATE DEHYDROGENASE: LDH: 158 U/L (ref 94–250)

## 2008-03-12 ENCOUNTER — Ambulatory Visit (HOSPITAL_COMMUNITY): Admission: RE | Admit: 2008-03-12 | Discharge: 2008-03-12 | Payer: Self-pay | Admitting: Oncology

## 2008-06-13 ENCOUNTER — Ambulatory Visit: Payer: Self-pay | Admitting: Oncology

## 2008-06-15 LAB — COMPREHENSIVE METABOLIC PANEL
ALT: 27 U/L (ref 0–53)
AST: 20 U/L (ref 0–37)
Albumin: 4.7 g/dL (ref 3.5–5.2)
Alkaline Phosphatase: 54 U/L (ref 39–117)
BUN: 20 mg/dL (ref 6–23)
CO2: 28 mEq/L (ref 19–32)
Calcium: 9.4 mg/dL (ref 8.4–10.5)
Chloride: 97 mEq/L (ref 96–112)
Creatinine, Ser: 1.01 mg/dL (ref 0.40–1.50)
Glucose, Bld: 84 mg/dL (ref 70–99)
Potassium: 3.9 mEq/L (ref 3.5–5.3)
Sodium: 140 mEq/L (ref 135–145)
Total Bilirubin: 1.1 mg/dL (ref 0.3–1.2)
Total Protein: 6.9 g/dL (ref 6.0–8.3)

## 2008-06-15 LAB — CBC WITH DIFFERENTIAL/PLATELET
BASO%: 0.4 % (ref 0.0–2.0)
Basophils Absolute: 0 10*3/uL (ref 0.0–0.1)
EOS%: 1.7 % (ref 0.0–7.0)
Eosinophils Absolute: 0.1 10*3/uL (ref 0.0–0.5)
HCT: 45.8 % (ref 38.7–49.9)
HGB: 16.4 g/dL (ref 13.0–17.1)
LYMPH%: 19 % (ref 14.0–48.0)
MCH: 32 pg (ref 28.0–33.4)
MCHC: 35.7 g/dL (ref 32.0–35.9)
MCV: 89.6 fL (ref 81.6–98.0)
MONO#: 0.4 10*3/uL (ref 0.1–0.9)
MONO%: 6.5 % (ref 0.0–13.0)
NEUT#: 4.2 10*3/uL (ref 1.5–6.5)
NEUT%: 72.4 % (ref 40.0–75.0)
Platelets: 226 10*3/uL (ref 145–400)
RBC: 5.11 10*6/uL (ref 4.20–5.71)
RDW: 13.2 % (ref 11.2–14.6)
WBC: 5.8 10*3/uL (ref 4.0–10.0)
lymph#: 1.1 10*3/uL (ref 0.9–3.3)

## 2008-06-15 LAB — LACTATE DEHYDROGENASE: LDH: 177 U/L (ref 94–250)

## 2008-11-07 ENCOUNTER — Ambulatory Visit: Payer: Self-pay | Admitting: Oncology

## 2008-11-09 LAB — CBC WITH DIFFERENTIAL/PLATELET
BASO%: 0.6 % (ref 0.0–2.0)
Basophils Absolute: 0 10*3/uL (ref 0.0–0.1)
EOS%: 1.7 % (ref 0.0–7.0)
Eosinophils Absolute: 0.1 10*3/uL (ref 0.0–0.5)
HCT: 43.2 % (ref 38.4–49.9)
HGB: 15 g/dL (ref 13.0–17.1)
LYMPH%: 20.7 % (ref 14.0–49.0)
MCH: 31.3 pg (ref 27.2–33.4)
MCHC: 34.7 g/dL (ref 32.0–36.0)
MCV: 90.1 fL (ref 79.3–98.0)
MONO#: 0.3 10*3/uL (ref 0.1–0.9)
MONO%: 7.8 % (ref 0.0–14.0)
NEUT#: 2.7 10*3/uL (ref 1.5–6.5)
NEUT%: 69.2 % (ref 39.0–75.0)
Platelets: 181 10*3/uL (ref 140–400)
RBC: 4.8 10*6/uL (ref 4.20–5.82)
RDW: 13.5 % (ref 11.0–14.6)
WBC: 3.9 10*3/uL — ABNORMAL LOW (ref 4.0–10.3)
lymph#: 0.8 10*3/uL — ABNORMAL LOW (ref 0.9–3.3)

## 2008-11-09 LAB — COMPREHENSIVE METABOLIC PANEL
ALT: 15 U/L (ref 0–53)
AST: 11 U/L (ref 0–37)
Albumin: 4.1 g/dL (ref 3.5–5.2)
Alkaline Phosphatase: 41 U/L (ref 39–117)
BUN: 17 mg/dL (ref 6–23)
CO2: 29 mEq/L (ref 19–32)
Calcium: 9.3 mg/dL (ref 8.4–10.5)
Chloride: 108 mEq/L (ref 96–112)
Creatinine, Ser: 1.07 mg/dL (ref 0.40–1.50)
Glucose, Bld: 105 mg/dL — ABNORMAL HIGH (ref 70–99)
Potassium: 4.8 mEq/L (ref 3.5–5.3)
Sodium: 143 mEq/L (ref 135–145)
Total Bilirubin: 0.7 mg/dL (ref 0.3–1.2)
Total Protein: 6.3 g/dL (ref 6.0–8.3)

## 2008-11-09 LAB — LACTATE DEHYDROGENASE: LDH: 126 U/L (ref 94–250)

## 2008-11-13 ENCOUNTER — Ambulatory Visit (HOSPITAL_COMMUNITY): Admission: RE | Admit: 2008-11-13 | Discharge: 2008-11-13 | Payer: Self-pay | Admitting: Oncology

## 2009-04-16 ENCOUNTER — Ambulatory Visit: Payer: Self-pay | Admitting: Oncology

## 2009-04-18 LAB — CBC WITH DIFFERENTIAL/PLATELET
BASO%: 0.6 % (ref 0.0–2.0)
Basophils Absolute: 0 10*3/uL (ref 0.0–0.1)
EOS%: 2.4 % (ref 0.0–7.0)
Eosinophils Absolute: 0.1 10*3/uL (ref 0.0–0.5)
HCT: 44.9 % (ref 38.4–49.9)
HGB: 15.4 g/dL (ref 13.0–17.1)
LYMPH%: 22.4 % (ref 14.0–49.0)
MCH: 31.7 pg (ref 27.2–33.4)
MCHC: 34.2 g/dL (ref 32.0–36.0)
MCV: 92.5 fL (ref 79.3–98.0)
MONO#: 0.3 10*3/uL (ref 0.1–0.9)
MONO%: 6.5 % (ref 0.0–14.0)
NEUT#: 2.8 10*3/uL (ref 1.5–6.5)
NEUT%: 68.1 % (ref 39.0–75.0)
Platelets: 192 10*3/uL (ref 140–400)
RBC: 4.85 10*6/uL (ref 4.20–5.82)
RDW: 13.2 % (ref 11.0–14.6)
WBC: 4.2 10*3/uL (ref 4.0–10.3)
lymph#: 0.9 10*3/uL (ref 0.9–3.3)

## 2009-04-18 LAB — COMPREHENSIVE METABOLIC PANEL
ALT: 16 U/L (ref 0–53)
AST: 12 U/L (ref 0–37)
Albumin: 4.3 g/dL (ref 3.5–5.2)
Alkaline Phosphatase: 42 U/L (ref 39–117)
BUN: 15 mg/dL (ref 6–23)
CO2: 28 mEq/L (ref 19–32)
Calcium: 9.1 mg/dL (ref 8.4–10.5)
Chloride: 107 mEq/L (ref 96–112)
Creatinine, Ser: 0.94 mg/dL (ref 0.40–1.50)
Glucose, Bld: 103 mg/dL — ABNORMAL HIGH (ref 70–99)
Potassium: 4.8 mEq/L (ref 3.5–5.3)
Sodium: 144 mEq/L (ref 135–145)
Total Bilirubin: 0.8 mg/dL (ref 0.3–1.2)
Total Protein: 6.2 g/dL (ref 6.0–8.3)

## 2009-04-18 LAB — LACTATE DEHYDROGENASE: LDH: 137 U/L (ref 94–250)

## 2009-10-04 ENCOUNTER — Ambulatory Visit: Payer: Self-pay | Admitting: Oncology

## 2009-10-07 ENCOUNTER — Ambulatory Visit (HOSPITAL_COMMUNITY): Admission: RE | Admit: 2009-10-07 | Discharge: 2009-10-07 | Payer: Self-pay | Admitting: Oncology

## 2009-10-07 LAB — CBC WITH DIFFERENTIAL/PLATELET
BASO%: 0.7 % (ref 0.0–2.0)
Basophils Absolute: 0 10*3/uL (ref 0.0–0.1)
EOS%: 2.5 % (ref 0.0–7.0)
Eosinophils Absolute: 0.1 10*3/uL (ref 0.0–0.5)
HCT: 46.4 % (ref 38.4–49.9)
HGB: 15.6 g/dL (ref 13.0–17.1)
LYMPH%: 22 % (ref 14.0–49.0)
MCH: 31.5 pg (ref 27.2–33.4)
MCHC: 33.7 g/dL (ref 32.0–36.0)
MCV: 93.5 fL (ref 79.3–98.0)
MONO#: 0.3 10*3/uL (ref 0.1–0.9)
MONO%: 7.1 % (ref 0.0–14.0)
NEUT#: 2.7 10*3/uL (ref 1.5–6.5)
NEUT%: 67.7 % (ref 39.0–75.0)
Platelets: 208 10*3/uL (ref 140–400)
RBC: 4.97 10*6/uL (ref 4.20–5.82)
RDW: 13.2 % (ref 11.0–14.6)
WBC: 4 10*3/uL (ref 4.0–10.3)
lymph#: 0.9 10*3/uL (ref 0.9–3.3)

## 2009-10-07 LAB — COMPREHENSIVE METABOLIC PANEL
ALT: 22 U/L (ref 0–53)
AST: 19 U/L (ref 0–37)
Albumin: 3.9 g/dL (ref 3.5–5.2)
Alkaline Phosphatase: 38 U/L — ABNORMAL LOW (ref 39–117)
BUN: 12 mg/dL (ref 6–23)
CO2: 32 mEq/L (ref 19–32)
Calcium: 9.2 mg/dL (ref 8.4–10.5)
Chloride: 106 mEq/L (ref 96–112)
Creatinine, Ser: 0.9 mg/dL (ref 0.40–1.50)
Glucose, Bld: 99 mg/dL (ref 70–99)
Potassium: 4.9 mEq/L (ref 3.5–5.3)
Sodium: 141 mEq/L (ref 135–145)
Total Bilirubin: 0.9 mg/dL (ref 0.3–1.2)
Total Protein: 6.3 g/dL (ref 6.0–8.3)

## 2009-10-07 LAB — LACTATE DEHYDROGENASE: LDH: 124 U/L (ref 94–250)

## 2010-04-11 ENCOUNTER — Ambulatory Visit: Payer: Self-pay | Admitting: Oncology

## 2010-04-15 ENCOUNTER — Encounter: Payer: Self-pay | Admitting: Internal Medicine

## 2010-04-15 LAB — CBC WITH DIFFERENTIAL/PLATELET
BASO%: 0.4 % (ref 0.0–2.0)
Basophils Absolute: 0 10*3/uL (ref 0.0–0.1)
EOS%: 2.9 % (ref 0.0–7.0)
Eosinophils Absolute: 0.1 10*3/uL (ref 0.0–0.5)
HCT: 44.1 % (ref 38.4–49.9)
HGB: 15.6 g/dL (ref 13.0–17.1)
LYMPH%: 20 % (ref 14.0–49.0)
MCH: 32.7 pg (ref 27.2–33.4)
MCHC: 35.4 g/dL (ref 32.0–36.0)
MCV: 92.3 fL (ref 79.3–98.0)
MONO#: 0.3 10*3/uL (ref 0.1–0.9)
MONO%: 7 % (ref 0.0–14.0)
NEUT#: 3.2 10*3/uL (ref 1.5–6.5)
NEUT%: 69.7 % (ref 39.0–75.0)
Platelets: 215 10*3/uL (ref 140–400)
RBC: 4.78 10*6/uL (ref 4.20–5.82)
RDW: 13.1 % (ref 11.0–14.6)
WBC: 4.6 10*3/uL (ref 4.0–10.3)
lymph#: 0.9 10*3/uL (ref 0.9–3.3)

## 2010-04-15 LAB — COMPREHENSIVE METABOLIC PANEL
ALT: 18 U/L (ref 0–53)
AST: 16 U/L (ref 0–37)
Albumin: 4.7 g/dL (ref 3.5–5.2)
Alkaline Phosphatase: 45 U/L (ref 39–117)
BUN: 15 mg/dL (ref 6–23)
CO2: 26 mEq/L (ref 19–32)
Calcium: 9.4 mg/dL (ref 8.4–10.5)
Chloride: 107 mEq/L (ref 96–112)
Creatinine, Ser: 0.94 mg/dL (ref 0.40–1.50)
Glucose, Bld: 94 mg/dL (ref 70–99)
Potassium: 4.5 mEq/L (ref 3.5–5.3)
Sodium: 137 mEq/L (ref 135–145)
Total Bilirubin: 0.7 mg/dL (ref 0.3–1.2)
Total Protein: 6.4 g/dL (ref 6.0–8.3)

## 2010-04-15 LAB — LACTATE DEHYDROGENASE: LDH: 153 U/L (ref 94–250)

## 2010-06-27 ENCOUNTER — Other Ambulatory Visit: Payer: Self-pay | Admitting: Oncology

## 2010-06-27 DIAGNOSIS — C8590 Non-Hodgkin lymphoma, unspecified, unspecified site: Secondary | ICD-10-CM

## 2010-06-29 ENCOUNTER — Encounter: Payer: Self-pay | Admitting: Oncology

## 2010-07-08 NOTE — Letter (Signed)
Summary: [facility]  [facility]   Imported By: Lennie Odor 04/21/2010 14:31:41  _____________________________________________________________________  External Attachment:    Type:   Image     Comment:   External Document

## 2010-10-07 ENCOUNTER — Other Ambulatory Visit (HOSPITAL_COMMUNITY): Payer: Self-pay

## 2010-10-15 ENCOUNTER — Other Ambulatory Visit: Payer: Self-pay | Admitting: Oncology

## 2010-10-15 ENCOUNTER — Encounter (HOSPITAL_BASED_OUTPATIENT_CLINIC_OR_DEPARTMENT_OTHER): Payer: BC Managed Care – PPO | Admitting: Oncology

## 2010-10-15 ENCOUNTER — Other Ambulatory Visit (HOSPITAL_COMMUNITY): Payer: Self-pay

## 2010-10-15 ENCOUNTER — Ambulatory Visit (HOSPITAL_COMMUNITY)
Admission: RE | Admit: 2010-10-15 | Discharge: 2010-10-15 | Disposition: A | Discharge status: Home / Self Care | Payer: BC Managed Care – PPO | Source: Ambulatory Visit | Attending: Oncology | Admitting: Oncology

## 2010-10-15 ENCOUNTER — Encounter (HOSPITAL_COMMUNITY): Payer: Self-pay

## 2010-10-15 DIAGNOSIS — C8590 Non-Hodgkin lymphoma, unspecified, unspecified site: Secondary | ICD-10-CM

## 2010-10-15 DIAGNOSIS — M899 Disorder of bone, unspecified: Secondary | ICD-10-CM | POA: Insufficient documentation

## 2010-10-15 DIAGNOSIS — M949 Disorder of cartilage, unspecified: Secondary | ICD-10-CM | POA: Insufficient documentation

## 2010-10-15 DIAGNOSIS — C8589 Other specified types of non-Hodgkin lymphoma, extranodal and solid organ sites: Secondary | ICD-10-CM

## 2010-10-15 DIAGNOSIS — K5730 Diverticulosis of large intestine without perforation or abscess without bleeding: Secondary | ICD-10-CM | POA: Insufficient documentation

## 2010-10-15 HISTORY — DX: Non-Hodgkin lymphoma, unspecified, unspecified site: C85.90

## 2010-10-15 LAB — CBC WITH DIFFERENTIAL/PLATELET
BASO%: 0.5 % (ref 0.0–2.0)
Basophils Absolute: 0 10*3/uL (ref 0.0–0.1)
EOS%: 2.4 % (ref 0.0–7.0)
Eosinophils Absolute: 0.1 10*3/uL (ref 0.0–0.5)
HCT: 44.9 % (ref 38.4–49.9)
HGB: 15.6 g/dL (ref 13.0–17.1)
LYMPH%: 22.1 % (ref 14.0–49.0)
MCH: 32 pg (ref 27.2–33.4)
MCHC: 34.8 g/dL (ref 32.0–36.0)
MCV: 92.1 fL (ref 79.3–98.0)
MONO#: 0.3 10*3/uL (ref 0.1–0.9)
MONO%: 7 % (ref 0.0–14.0)
NEUT#: 2.6 10*3/uL (ref 1.5–6.5)
NEUT%: 68 % (ref 39.0–75.0)
Platelets: 186 10*3/uL (ref 140–400)
RBC: 4.88 10*6/uL (ref 4.20–5.82)
RDW: 13.1 % (ref 11.0–14.6)
WBC: 3.9 10*3/uL — ABNORMAL LOW (ref 4.0–10.3)
lymph#: 0.9 10*3/uL (ref 0.9–3.3)

## 2010-10-15 LAB — CMP (CANCER CENTER ONLY)
ALT(SGPT): 33 U/L (ref 10–47)
AST: 22 U/L (ref 11–38)
Albumin: 3.5 g/dL (ref 3.3–5.5)
Alkaline Phosphatase: 36 U/L (ref 26–84)
BUN, Bld: 15 mg/dL (ref 7–22)
CO2: 30 mEq/L (ref 18–33)
Calcium: 9.1 mg/dL (ref 8.0–10.3)
Chloride: 100 mEq/L (ref 98–108)
Creat: 0.7 mg/dl (ref 0.6–1.2)
Glucose, Bld: 107 mg/dL (ref 73–118)
Potassium: 5.3 mEq/L — ABNORMAL HIGH (ref 3.3–4.7)
Sodium: 146 mEq/L — ABNORMAL HIGH (ref 128–145)
Total Bilirubin: 1 mg/dl (ref 0.20–1.60)
Total Protein: 6.3 g/dL — ABNORMAL LOW (ref 6.4–8.1)

## 2010-10-15 MED ORDER — IOHEXOL 300 MG/ML  SOLN
125.0000 mL | Freq: Once | INTRAMUSCULAR | Status: AC | PRN
  Administered 2010-10-15: 125 mL via INTRAVENOUS

## 2010-10-17 ENCOUNTER — Encounter (HOSPITAL_BASED_OUTPATIENT_CLINIC_OR_DEPARTMENT_OTHER): Payer: BC Managed Care – PPO | Admitting: Oncology

## 2010-10-17 DIAGNOSIS — C8589 Other specified types of non-Hodgkin lymphoma, extranodal and solid organ sites: Secondary | ICD-10-CM

## 2010-10-17 DIAGNOSIS — I10 Essential (primary) hypertension: Secondary | ICD-10-CM

## 2010-10-24 NOTE — Op Note (Signed)
Clifford [name], Clifford [name]                ACCOUNT NO.:  [id number]   MEDICAL RECORD NO.:  [id number]          PATIENT TYPE:  OUT   LOCATION:  [facility]                         FACILITY:  [facility]   PHYSICIAN:  [name]. [name]        DATE OF BIRTH:  May 02, 1946   DATE OF PROCEDURE:  02/03/2006  DATE OF DISCHARGE:                                 OPERATIVE REPORT   PROCEDURE:  Bone marrow biopsy.   INDICATION:  64 year old gentleman with new diagnosis of MALT/marginal zone  lymphoma involving the pleura.  The bone marrow biopsy is for staging.   DESCRIPTION OF THE PROCEDURE:  The patient was brought into the same day  surgery after informed consent was obtained.  The patient was placed in the  lateral decubitus position exposing his left iliac crest.  The skin was  prepped and draped in a sterile fashion using Betadine.  The skin,  subcutaneous tissue, and the periosteum was anesthetized using 2% lidocaine.  Conscious sedation was obtained utilizing 4 mg of Versed and 50 mg of  Demerol.  The aspirate was obtained without any difficulty and was sent for  flow and cytogenetics.  Using the Jamshidi needle, the biopsy was obtained  also without difficulty.  The patient tolerated the procedure well.  No  complications.  Minimal bleeding.  The patient was given instruction to lie  flat for the next 45 minutes, take Tylenol and Motrin for pain.  The patient  to see me in clinic next week to discuss the results.           ______________________________  Blenda Nicely [facility]  Electronically Signed     FNS/MEDQ  D:  02/03/2006  T:  02/03/2006  Job:  [id number]

## 2010-10-24 NOTE — Discharge Summary (Signed)
Clifford [name], [name]                ACCOUNT NO.:  [id number]   MEDICAL RECORD NO.:  [id number]          PATIENT TYPE:  OUT   LOCATION:  [facility]                         FACILITY:  [facility]   PHYSICIAN:  Salvatore Decent. Dorris Fetch, M.D.DATE OF BIRTH:  1946-03-10   DATE OF ADMISSION:  02/03/2006  DATE OF DISCHARGE:  02/03/2006                                 DISCHARGE SUMMARY   ADDENDUM:  As previously dictated, the patient's chest tube was discontinued without  difficulty.  On postop day 4, the patient was without complaint.  He was  afebrile with stable vital signs.  His chest x-ray revealed no pneumothorax  after chest tube discontinuation.   PHYSICAL EXAM:  CARDIAC:  Regular rate and rhythm.  LUNGS:  Reveal crackles in the left base.  ABDOMEN:  Benign.  The incision was clean, dry and intact.  The patient was  status post left __________  with drainage of effusion and __________  lordosis.  He was in stable condition and felt to be ready for discharge.   BMP on February 04, 2006:  Sodium 138, potassium 3.4, BUN 4, creatinine 0.8,  glucose 102.      Pecola Leisure, PA    ______________________________  Salvatore Decent Dorris Fetch, M.D.    AY/MEDQ  D:  03/08/2006  T:  03/08/2006  Job:  [id number]

## 2011-02-13 ENCOUNTER — Other Ambulatory Visit: Payer: Self-pay | Admitting: Family Medicine

## 2011-02-13 DIAGNOSIS — R52 Pain, unspecified: Secondary | ICD-10-CM

## 2011-02-16 ENCOUNTER — Ambulatory Visit
Admission: RE | Admit: 2011-02-16 | Discharge: 2011-02-16 | Disposition: A | Discharge status: Home / Self Care | Payer: BC Managed Care – PPO | Source: Ambulatory Visit | Attending: Family Medicine | Admitting: Family Medicine

## 2011-02-16 DIAGNOSIS — R52 Pain, unspecified: Secondary | ICD-10-CM

## 2011-03-09 LAB — GLUCOSE, CAPILLARY: Glucose-Capillary: 109 — ABNORMAL HIGH

## 2011-03-18 ENCOUNTER — Encounter: Payer: Self-pay | Admitting: *Deleted

## 2011-04-15 ENCOUNTER — Encounter: Payer: Self-pay | Admitting: *Deleted

## 2011-04-21 ENCOUNTER — Telehealth: Payer: Self-pay | Admitting: Oncology

## 2011-04-21 ENCOUNTER — Other Ambulatory Visit: Payer: Self-pay | Admitting: Oncology

## 2011-04-21 ENCOUNTER — Other Ambulatory Visit (HOSPITAL_BASED_OUTPATIENT_CLINIC_OR_DEPARTMENT_OTHER): Payer: BC Managed Care – PPO

## 2011-04-21 ENCOUNTER — Other Ambulatory Visit: Payer: Self-pay | Admitting: *Deleted

## 2011-04-21 ENCOUNTER — Ambulatory Visit (HOSPITAL_BASED_OUTPATIENT_CLINIC_OR_DEPARTMENT_OTHER): Payer: BC Managed Care – PPO | Admitting: Oncology

## 2011-04-21 VITALS — BP 182/99 | HR 89 | Temp 97.1°F | Ht 72.5 in | Wt 220.5 lb

## 2011-04-21 DIAGNOSIS — R109 Unspecified abdominal pain: Secondary | ICD-10-CM

## 2011-04-21 DIAGNOSIS — C8589 Other specified types of non-Hodgkin lymphoma, extranodal and solid organ sites: Secondary | ICD-10-CM

## 2011-04-21 DIAGNOSIS — I10 Essential (primary) hypertension: Secondary | ICD-10-CM

## 2011-04-21 DIAGNOSIS — C8588 Other specified types of non-Hodgkin lymphoma, lymph nodes of multiple sites: Secondary | ICD-10-CM

## 2011-04-21 DIAGNOSIS — C8590 Non-Hodgkin lymphoma, unspecified, unspecified site: Secondary | ICD-10-CM

## 2011-04-21 DIAGNOSIS — R319 Hematuria, unspecified: Secondary | ICD-10-CM

## 2011-04-21 DIAGNOSIS — R52 Pain, unspecified: Secondary | ICD-10-CM

## 2011-04-21 LAB — CBC WITH DIFFERENTIAL/PLATELET
BASO%: 0.3 % (ref 0.0–2.0)
Basophils Absolute: 0 10*3/uL (ref 0.0–0.1)
EOS%: 2.3 % (ref 0.0–7.0)
Eosinophils Absolute: 0.1 10*3/uL (ref 0.0–0.5)
HCT: 45.3 % (ref 38.4–49.9)
HGB: 15.8 g/dL (ref 13.0–17.1)
LYMPH%: 21.9 % (ref 14.0–49.0)
MCH: 31.8 pg (ref 27.2–33.4)
MCHC: 34.8 g/dL (ref 32.0–36.0)
MCV: 91.4 fL (ref 79.3–98.0)
MONO#: 0.2 10*3/uL (ref 0.1–0.9)
MONO%: 5.2 % (ref 0.0–14.0)
NEUT#: 3 10*3/uL (ref 1.5–6.5)
NEUT%: 70.3 % (ref 39.0–75.0)
Platelets: 207 10*3/uL (ref 140–400)
RBC: 4.96 10*6/uL (ref 4.20–5.82)
RDW: 13 % (ref 11.0–14.6)
WBC: 4.3 10*3/uL (ref 4.0–10.3)
lymph#: 0.9 10*3/uL (ref 0.9–3.3)

## 2011-04-21 LAB — COMPREHENSIVE METABOLIC PANEL
ALT: 22 U/L (ref 0–53)
AST: 17 U/L (ref 0–37)
Albumin: 4.6 g/dL (ref 3.5–5.2)
Alkaline Phosphatase: 45 U/L (ref 39–117)
BUN: 18 mg/dL (ref 6–23)
CO2: 29 mEq/L (ref 19–32)
Calcium: 9.8 mg/dL (ref 8.4–10.5)
Chloride: 106 mEq/L (ref 96–112)
Creatinine, Ser: 1.21 mg/dL (ref 0.50–1.35)
Glucose, Bld: 106 mg/dL — ABNORMAL HIGH (ref 70–99)
Potassium: 4.5 mEq/L (ref 3.5–5.3)
Sodium: 144 mEq/L (ref 135–145)
Total Bilirubin: 0.8 mg/dL (ref 0.3–1.2)
Total Protein: 6.5 g/dL (ref 6.0–8.3)

## 2011-04-21 MED ORDER — HYDROCODONE-ACETAMINOPHEN 7.5-750 MG PO TABS: 1.0000 | ORAL_TABLET | Freq: Four times a day (QID) | ORAL | Status: AC | PRN

## 2011-04-21 NOTE — Telephone Encounter (Signed)
gve the pt his may 2013 appt calendar along with the ct scan appt °

## 2011-04-21 NOTE — Progress Notes (Signed)
Hematology and Oncology Follow Up Visit  Clifford [name] [id number] 1945-07-18 65 y.o. 04/21/2011 9:28 AM CC: Clifford [name], M.D.  Clifford Dalton. Sherene Sires, MD, FCCP  Clifford [name], M.D.     Principle Diagnosis:This is a pleasant 65 year old [name] with a diagnosis of non-Hodgkin's lymphoma, a mole type, presented with pleural effusion, pleural-based nodules.  Initial diagnosis was in 2007.   Prior Therapy: 1. Status post pleurodesis and biopsy of the pleural-based nodule. 2. Received 6 cycles of CVP with rituximab.  Therapy concluded in December 2007.  Continued to be in remission since that time.  Current therapy:Observation, surveillance.  Interim History:  Clifford [name] presents today for a followup visit.  He has continued to do very well without any evidence to suggest recurrent disease.  He has continued to be very active without really any decline in his energy or performance status.  Did not report any chest pain.  Did not report any shortness of breath.  Had not reported any difficulty breathing.  Had not reported any major changes in his performance status.  Had not reported any cough, fevers or hemoptysis or any constitutional symptoms. He is reporting some blood in his urine with flank pain radiating to his testicle.   Medications: I have reviewed the patient's current medications. Current outpatient prescriptions:aspirin 81 MG tablet, Take 81 mg by mouth daily.  , Disp: , Rfl: ;  Multiple Vitamin (MULTIVITAMIN) tablet, Take 1 tablet by mouth daily.  , Disp: , Rfl:   Allergies: No Known Allergies  Past Medical History, Surgical history, Social history, and Family History were reviewed and updated.  Review of Systems: Constitutional:  Negative for fever, chills, night sweats, anorexia, weight loss, pain. Cardiovascular: no chest pain or dyspnea on exertion Respiratory: no cough, shortness of breath, or wheezing Neurological: no TIA or stroke symptoms Dermatological:  negative ENT: negative Skin Gastrointestinal: positive for - abdominal pain Genito-Urinary: positive for - hematuria Hematological and Lymphatic: negative Breast: negative Musculoskeletal: negative Remaining ROS negative. Physical Exam: Blood pressure 182/99, pulse 89, temperature 97.1 F (36.2 C), height 6' 0.5" (1.842 m), weight 220 lb 8 oz (100.018 kg). ECOG:  General appearance: alert Head: Normocephalic, without obvious abnormality, atraumatic Neck: no adenopathy, no carotid bruit, no JVD, supple, symmetrical, trachea midline and thyroid not enlarged, symmetric, no tenderness/mass/nodules Lymph nodes: Cervical, supraclavicular, and axillary nodes normal. Heart:regular rate and rhythm, S1, S2 normal, no murmur, click, rub or gallop Lung:chest clear, no wheezing, rales, normal symmetric air entry, Heart exam - S1, S2 normal, no murmur, no gallop, rate regular Abdomin: soft, non-tender, without masses or organomegaly EXT:no erythema, induration, or nodules   Lab Results: Lab Results  Component Value Date   HGB 15.8 04/21/2011   HCT Clifford.3 04/21/2011   MCV 91.4 04/21/2011   PLT 207 04/21/2011     Chemistry      Component Value Date/Time   NA 146* 10/15/2010 0812   NA 137 04/15/2010 0822   K 5.3* 10/15/2010 0812   K 4.5 04/15/2010 0822   CL 100 10/15/2010 0812   CL 107 04/15/2010 0822   CO2 30 10/15/2010 0812   CO2 26 04/15/2010 0822   BUN 15 10/15/2010 0812   BUN 15 04/15/2010 0822   CREATININE 0.7 10/15/2010 0812   CREATININE 0.94 04/15/2010 0822      Component Value Date/Time   CALCIUM 9.1 10/15/2010 0812   CALCIUM 9.4 04/15/2010 0822   ALKPHOS 36 10/15/2010 0812   ALKPHOS Clifford 04/15/2010 4098  AST 22 10/15/2010 0812   AST 16 04/15/2010 0822   ALT 18 04/15/2010 0822   BILITOT 1.00 10/15/2010 0812   BILITOT 0.7 04/15/2010 0822       Impression and Plan: 65 year old [name] with the following issues. 1. Non-Hodgkin's lymphoma.  He is status post systemic chemotherapy completed in  December 2007 without any evidence of recurrent disease.  CT scan of neck, chest, abdomen and pelvis was reviewed from 10/2010 with him today and showed really no evidence of any recurrent or relapsed disease.  At the time being, we will continue to follow him clinically every 6 months and imaging studies on an annual basis. Next scan will be on 10/2011. 2. Flank pain and hematuria: Likely kidney stone. Referral to Urology already done. Rx for Vicodin given today.      [facility], MD 11/13/20129:28 AM

## 2011-10-20 ENCOUNTER — Other Ambulatory Visit (HOSPITAL_BASED_OUTPATIENT_CLINIC_OR_DEPARTMENT_OTHER): Payer: BC Managed Care – PPO | Admitting: Lab

## 2011-10-20 DIAGNOSIS — C8589 Other specified types of non-Hodgkin lymphoma, extranodal and solid organ sites: Secondary | ICD-10-CM

## 2011-10-20 DIAGNOSIS — C8590 Non-Hodgkin lymphoma, unspecified, unspecified site: Secondary | ICD-10-CM

## 2011-10-20 LAB — COMPREHENSIVE METABOLIC PANEL
ALT: 21 U/L (ref 0–53)
AST: 16 U/L (ref 0–37)
Albumin: 4.1 g/dL (ref 3.5–5.2)
Alkaline Phosphatase: 45 U/L (ref 39–117)
BUN: 16 mg/dL (ref 6–23)
CO2: 27 mEq/L (ref 19–32)
Calcium: 9.4 mg/dL (ref 8.4–10.5)
Chloride: 107 mEq/L (ref 96–112)
Creatinine, Ser: 0.98 mg/dL (ref 0.50–1.35)
Glucose, Bld: 97 mg/dL (ref 70–99)
Potassium: 4.4 mEq/L (ref 3.5–5.3)
Sodium: 142 mEq/L (ref 135–145)
Total Bilirubin: 0.8 mg/dL (ref 0.3–1.2)
Total Protein: 6.1 g/dL (ref 6.0–8.3)

## 2011-10-20 LAB — CBC WITH DIFFERENTIAL/PLATELET
BASO%: 0.8 % (ref 0.0–2.0)
Basophils Absolute: 0 10*3/uL (ref 0.0–0.1)
EOS%: 2 % (ref 0.0–7.0)
Eosinophils Absolute: 0.1 10*3/uL (ref 0.0–0.5)
HCT: 46.5 % (ref 38.4–49.9)
HGB: 16.2 g/dL (ref 13.0–17.1)
LYMPH%: 23.1 % (ref 14.0–49.0)
MCH: 32 pg (ref 27.2–33.4)
MCHC: 34.9 g/dL (ref 32.0–36.0)
MCV: 91.6 fL (ref 79.3–98.0)
MONO#: 0.2 10*3/uL (ref 0.1–0.9)
MONO%: 5.4 % (ref 0.0–14.0)
NEUT#: 2.9 10*3/uL (ref 1.5–6.5)
NEUT%: 68.7 % (ref 39.0–75.0)
Platelets: 172 10*3/uL (ref 140–400)
RBC: 5.07 10*6/uL (ref 4.20–5.82)
RDW: 13.3 % (ref 11.0–14.6)
WBC: 4.2 10*3/uL (ref 4.0–10.3)
lymph#: 1 10*3/uL (ref 0.9–3.3)
nRBC: 0 % (ref 0–0)

## 2011-10-21 ENCOUNTER — Encounter (HOSPITAL_COMMUNITY): Payer: Self-pay

## 2011-10-21 ENCOUNTER — Ambulatory Visit (HOSPITAL_COMMUNITY)
Admission: RE | Admit: 2011-10-21 | Discharge: 2011-10-21 | Disposition: A | Discharge status: Home / Self Care | Payer: BC Managed Care – PPO | Source: Ambulatory Visit | Attending: Oncology | Admitting: Oncology

## 2011-10-21 DIAGNOSIS — C8589 Other specified types of non-Hodgkin lymphoma, extranodal and solid organ sites: Secondary | ICD-10-CM | POA: Insufficient documentation

## 2011-10-21 DIAGNOSIS — M948X9 Other specified disorders of cartilage, unspecified sites: Secondary | ICD-10-CM | POA: Insufficient documentation

## 2011-10-21 DIAGNOSIS — C8590 Non-Hodgkin lymphoma, unspecified, unspecified site: Secondary | ICD-10-CM

## 2011-10-21 MED ORDER — IOHEXOL 300 MG/ML  SOLN
100.0000 mL | Freq: Once | INTRAMUSCULAR | Status: AC | PRN
  Administered 2011-10-21: 100 mL via INTRAVENOUS

## 2011-10-23 ENCOUNTER — Telehealth: Payer: Self-pay | Admitting: Oncology

## 2011-10-23 ENCOUNTER — Ambulatory Visit (HOSPITAL_BASED_OUTPATIENT_CLINIC_OR_DEPARTMENT_OTHER): Payer: BC Managed Care – PPO | Admitting: Oncology

## 2011-10-23 VITALS — BP 146/92 | HR 90 | Temp 97.6°F | Ht 72.5 in | Wt 222.9 lb

## 2011-10-23 DIAGNOSIS — C8589 Other specified types of non-Hodgkin lymphoma, extranodal and solid organ sites: Secondary | ICD-10-CM

## 2011-10-23 DIAGNOSIS — C8590 Non-Hodgkin lymphoma, unspecified, unspecified site: Secondary | ICD-10-CM

## 2011-10-23 NOTE — Progress Notes (Signed)
Hematology and Oncology Follow Up Visit  Clifford [name] [id number] 23-Dec-1945 66 y.o. 10/23/2011 9:42 AM CC: Clifford [name]. Clifford [name], M.D.  Clifford [name]. Clifford Sires, MD, FCCP  Clifford [name]. Dorris Fetch, M.D.     Principle Diagnosis:This is a pleasant 66 year old gentleman with a diagnosis of non-Hodgkin's lymphoma, a mole type, presented with pleural effusion, pleural-based nodules.  Initial diagnosis was in 2007.   Prior Therapy: 1. Status post pleurodesis and biopsy of the pleural-based nodule. 2. Received 6 cycles of CVP with rituximab. Therapy concluded in December 2007.  Continued to be in remission since that time.  Current therapy:Observation, surveillance.  Interim History:  Clifford [name] presents today for a followup visit.  He has continued to do very well without any evidence to suggest recurrent disease.  He has continued to be very active without really any decline in his energy or performance status.  Did not report any chest pain.  Did not report any shortness of breath.  Had not reported any difficulty breathing.  Had not reported any major changes in his performance status.  Had not reported any cough, fevers or hemoptysis or any constitutional symptoms. He is no longer reporting blood in his urine or flank pain at this time. He was seen by Urology and his cystoscopy was clear. He continued to work full time and golf regularly.   Medications: I have reviewed the patient's current medications. Current outpatient prescriptions:aspirin 81 MG tablet, Take 81 mg by mouth daily.  , Disp: , Rfl: ;  Multiple Vitamin (MULTIVITAMIN) tablet, Take 1 tablet by mouth daily.  , Disp: , Rfl:   Allergies: No Known Allergies  Past Medical History, Surgical history, Social history, and Family History were reviewed and updated.  Review of Systems: Constitutional:  Negative for fever, chills, night sweats, anorexia, weight loss, pain. Cardiovascular: no chest pain or dyspnea on exertion Respiratory: no cough,  shortness of breath, or wheezing Neurological: no TIA or stroke symptoms Dermatological: negative ENT: negative Skin Gastrointestinal: positive for - abdominal pain Genito-Urinary: positive for - hematuria Hematological and Lymphatic: negative Breast: negative Musculoskeletal: negative Remaining ROS negative. Physical Exam: Blood pressure 146/92, pulse 90, temperature 97.6 F (36.4 C), temperature source Oral, height 6' 0.5" (1.842 m), weight 222 lb 14.4 oz (101.107 kg). ECOG: 0 General appearance: alert Head: Normocephalic, without obvious abnormality, atraumatic Neck: no adenopathy, no carotid bruit, no JVD, supple, symmetrical, trachea midline and thyroid not enlarged, symmetric, no tenderness/mass/nodules Lymph nodes: Cervical, supraclavicular, and axillary nodes normal. Heart:regular rate and rhythm, S1, S2 normal, no murmur, click, rub or gallop Lung:chest clear, no wheezing, rales, normal symmetric air entry, Heart exam - S1, S2 normal, no murmur, no gallop, rate regular Abdomin: soft, non-tender, without masses or organomegaly EXT:no erythema, induration, or nodules   Lab Results: Lab Results  Component Value Date   WBC 4.2 10/20/2011   HGB 16.2 10/20/2011   HCT 46.5 10/20/2011   MCV 91.6 10/20/2011   PLT 172 10/20/2011     Chemistry      Component Value Date/Time   NA 142 10/20/2011 0725   NA 146* 10/15/2010 0812   K 4.4 10/20/2011 0725   K 5.3* 10/15/2010 0812   CL 107 10/20/2011 0725   CL 100 10/15/2010 0812   CO2 27 10/20/2011 0725   CO2 30 10/15/2010 0812   BUN 16 10/20/2011 0725   BUN 15 10/15/2010 0812   CREATININE 0.98 10/20/2011 0725   CREATININE 0.7 10/15/2010 0812      Component Value Date/Time  CALCIUM 9.4 10/20/2011 0725   CALCIUM 9.1 10/15/2010 0812   ALKPHOS 45 10/20/2011 0725   ALKPHOS 36 10/15/2010 0812   AST 16 10/20/2011 0725   AST 22 10/15/2010 0812   ALT 21 10/20/2011 0725   BILITOT 0.8 10/20/2011 0725   BILITOT 1.00 10/15/2010 0812     CT CHEST, ABDOMEN AND  PELVIS WITH CONTRAST  Technique: Multidetector CT imaging of the chest, abdomen and  pelvis was performed following the standard protocol during bolus  administration of intravenous contrast.  Contrast: OMNIPAQUE IOHEXOL 300 MG/ML SOLN  Comparison: 10/15/2010 and 10/07/2009.  CT CHEST  Findings: There are no enlarged mediastinal, hilar or axillary  lymph nodes. There is no pleural or pericardial effusion. Pleural  thickening on the left is stable. Coronary artery calcifications  are again noted.  The lungs appear stable with scattered scarring and minimal  subpleural nodularity. There are no suspicious osseous findings.  IMPRESSION:  Stable chest CT. No evidence of recurrent lymphoma.  CT ABDOMEN AND PELVIS  Findings: The spleen remains normal in size and demonstrates no  focal abnormality. Scattered small low density hepatic lesions are  stable. The gallbladder, biliary system and pancreas appear  normal. The adrenal glands and kidneys appear normal.  No enlarged abdominal pelvic lymph nodes are identified. The bowel  gas pattern is normal. The appendix appears normal. There is  stable mild aorto iliac atherosclerosis.  Sclerotic lesions in the iliac bones bilaterally are unchanged.  There is no lytic lesion or pathologic fracture.  IMPRESSION:  1. Stable abdominal pelvic CT. No adenopathy or splenomegaly.  2. Stable sclerotic lesions in the iliac bones bilaterally, most  consistent with bone islands.   Impression and Plan: 66 year old gentleman with the following issues. 1. Non-Hodgkin's lymphoma.  He is status post systemic chemotherapy completed in December 2007 without any evidence of recurrent disease.  CT scan of neck, chest, abdomen and pelvis was reviewed from 10/2011 with him today and showed really no evidence of any recurrent or relapsed disease.  At the time being, we will continue to follow him clinically every 12 months and imaging studies on as needed  bases. 2. Flank pain and hematuria: likely due to a stone that has passes. This have subsided.       [facility], MD 5/17/20139:42 AM

## 2011-10-23 NOTE — Telephone Encounter (Signed)
appts made and printed for pt om °

## 2012-07-31 ENCOUNTER — Emergency Department (HOSPITAL_COMMUNITY)
Admission: EM | Admit: 2012-07-31 | Discharge: 2012-07-31 | Disposition: A | Discharge status: Home / Self Care | Payer: Managed Care, Other (non HMO) | Attending: Emergency Medicine | Admitting: Emergency Medicine

## 2012-07-31 ENCOUNTER — Encounter (HOSPITAL_COMMUNITY): Payer: Self-pay | Admitting: Emergency Medicine

## 2012-07-31 ENCOUNTER — Emergency Department (HOSPITAL_COMMUNITY): Payer: Managed Care, Other (non HMO)

## 2012-07-31 DIAGNOSIS — Z7982 Long term (current) use of aspirin: Secondary | ICD-10-CM | POA: Insufficient documentation

## 2012-07-31 DIAGNOSIS — Z87898 Personal history of other specified conditions: Secondary | ICD-10-CM | POA: Insufficient documentation

## 2012-07-31 DIAGNOSIS — I10 Essential (primary) hypertension: Secondary | ICD-10-CM | POA: Insufficient documentation

## 2012-07-31 DIAGNOSIS — N23 Unspecified renal colic: Secondary | ICD-10-CM | POA: Insufficient documentation

## 2012-07-31 DIAGNOSIS — Z87442 Personal history of urinary calculi: Secondary | ICD-10-CM | POA: Insufficient documentation

## 2012-07-31 LAB — URINE MICROSCOPIC-ADD ON

## 2012-07-31 LAB — URINALYSIS, ROUTINE W REFLEX MICROSCOPIC
Bilirubin Urine: NEGATIVE
Glucose, UA: 100 mg/dL — AB
Ketones, ur: NEGATIVE mg/dL
Nitrite: NEGATIVE
Protein, ur: NEGATIVE mg/dL
Specific Gravity, Urine: 1.028 (ref 1.005–1.030)
Urobilinogen, UA: 1 mg/dL (ref 0.0–1.0)
pH: 5.5 (ref 5.0–8.0)

## 2012-07-31 MED ORDER — SODIUM CHLORIDE 0.9 % IV SOLN
INTRAVENOUS | Status: DC
  Administered 2012-07-31: 18:00:00 via INTRAVENOUS

## 2012-07-31 MED ORDER — ONDANSETRON HCL 4 MG/2ML IJ SOLN
4.0000 mg | Freq: Once | INTRAMUSCULAR | Status: AC
  Administered 2012-07-31: 4 mg via INTRAVENOUS
  Filled 2012-07-31: qty 2

## 2012-07-31 MED ORDER — MORPHINE SULFATE 4 MG/ML IJ SOLN
4.0000 mg | Freq: Once | INTRAMUSCULAR | Status: AC
  Administered 2012-07-31: 4 mg via INTRAVENOUS
  Filled 2012-07-31: qty 1

## 2012-07-31 MED ORDER — OXYCODONE-ACETAMINOPHEN 5-325 MG PO TABS: 2.0000 | ORAL_TABLET | ORAL | Status: DC | PRN

## 2012-07-31 NOTE — ED Notes (Signed)
Bladder scan completed 15ml

## 2012-07-31 NOTE — ED Provider Notes (Signed)
History     CSN: [id number]  Arrival date & time 07/31/12  1651   First MD Initiated Contact with Patient 07/31/12 1720      Chief Complaint  Patient presents with  . Flank Pain    (Consider location/radiation/quality/duration/timing/severity/associated sxs/prior treatment) Patient is a 67 y.o. male presenting with flank pain. The history is provided by the patient.  Flank Pain   patient here complaining of left-sided flank pain since this morning similar to his prior renal colic. Went to urgent care and was given prescription for Flomax as well as Vicodin. He took the Vicodin but the pain persists. Was able to urinate a small amount which did not have any blood or pus. Last kidney stone was a year ago. Denies any fever chills or vomiting. Symptoms have been persistent.  Past Medical History  Diagnosis Date  . Hypertension   . NHL (non-Hodgkin's lymphoma)     nhl ca dx 2007    No past surgical history on file.  No family history on file.  History  Substance Use Topics  . Smoking status: Not on file  . Smokeless tobacco: Not on file  . Alcohol Use: Not on file      Review of Systems  Genitourinary: Positive for flank pain.  All other systems reviewed and are negative.    Allergies  Review of patient's allergies indicates no known allergies.  Home Medications   Current Outpatient Rx  Name  Route  Sig  Dispense  Refill  . aspirin 81 MG tablet   Oral   Take 81 mg by mouth daily.           . Multiple Vitamin (MULTIVITAMIN) tablet   Oral   Take 1 tablet by mouth daily.             BP 163/92  Pulse 83  Temp(Src) 98.7 F (37.1 C) (Oral)  Resp 20  SpO2 100%  Physical Exam  Nursing note and vitals reviewed. Constitutional: He is oriented to person, place, and time. He appears well-developed and well-nourished.  Non-toxic appearance. No distress.  HENT:  Head: Normocephalic and atraumatic.  Eyes: Conjunctivae, EOM and lids are normal. Pupils are  equal, round, and reactive to light.  Neck: Normal range of motion. Neck supple. No tracheal deviation present. No mass present.  Cardiovascular: Normal rate, regular rhythm and normal heart sounds.  Exam reveals no gallop.   No murmur heard. Pulmonary/Chest: Effort normal and breath sounds normal. No stridor. No respiratory distress. He has no decreased breath sounds. He has no wheezes. He has no rhonchi. He has no rales.  Abdominal: Soft. Normal appearance and bowel sounds are normal. He exhibits no distension. There is no tenderness. There is CVA tenderness. There is no rigidity, no rebound and no guarding.  Musculoskeletal: Normal range of motion. He exhibits no edema and no tenderness.  Neurological: He is alert and oriented to person, place, and time. He has normal strength. No cranial nerve deficit or sensory deficit. GCS eye subscore is 4. GCS verbal subscore is 5. GCS motor subscore is 6.  Skin: Skin is warm and dry. No abrasion and no rash noted.  Psychiatric: He has a normal mood and affect. His speech is normal and behavior is normal.    ED Course  Procedures (including critical care time)  Labs Reviewed  URINALYSIS, ROUTINE W REFLEX MICROSCOPIC   No results found.   No diagnosis found.    MDM  Patient given morphine x2 for  his renal colic and does flow better at this time. Will followup with his urologist        Toy Baker, MD 07/31/12 2018

## 2012-07-31 NOTE — ED Notes (Signed)
Pt w/ hx of kidney stones.  States he is "trying to pass a stone today".  States that he was having lt sided flank pain but states it is now coming around to the front.  Pain 5/10.  Has taken a vicodin.

## 2012-10-25 ENCOUNTER — Other Ambulatory Visit: Payer: Self-pay | Admitting: Oncology

## 2012-10-25 DIAGNOSIS — C8589 Other specified types of non-Hodgkin lymphoma, extranodal and solid organ sites: Secondary | ICD-10-CM

## 2012-10-26 ENCOUNTER — Other Ambulatory Visit (HOSPITAL_BASED_OUTPATIENT_CLINIC_OR_DEPARTMENT_OTHER): Payer: Managed Care, Other (non HMO) | Admitting: Lab

## 2012-10-26 ENCOUNTER — Telehealth: Payer: Self-pay | Admitting: Oncology

## 2012-10-26 ENCOUNTER — Ambulatory Visit (HOSPITAL_BASED_OUTPATIENT_CLINIC_OR_DEPARTMENT_OTHER): Payer: Managed Care, Other (non HMO) | Admitting: Oncology

## 2012-10-26 VITALS — BP 192/104 | HR 86 | Temp 98.0°F | Resp 18 | Ht 72.0 in | Wt 224.2 lb

## 2012-10-26 DIAGNOSIS — R109 Unspecified abdominal pain: Secondary | ICD-10-CM

## 2012-10-26 DIAGNOSIS — C8589 Other specified types of non-Hodgkin lymphoma, extranodal and solid organ sites: Secondary | ICD-10-CM

## 2012-10-26 DIAGNOSIS — R319 Hematuria, unspecified: Secondary | ICD-10-CM

## 2012-10-26 DIAGNOSIS — I10 Essential (primary) hypertension: Secondary | ICD-10-CM

## 2012-10-26 DIAGNOSIS — C8299 Follicular lymphoma, unspecified, extranodal and solid organ sites: Secondary | ICD-10-CM

## 2012-10-26 LAB — CBC WITH DIFFERENTIAL/PLATELET
BASO%: 1.1 % (ref 0.0–2.0)
Basophils Absolute: 0.1 10*3/uL (ref 0.0–0.1)
EOS%: 1.9 % (ref 0.0–7.0)
Eosinophils Absolute: 0.1 10*3/uL (ref 0.0–0.5)
HCT: 44.8 % (ref 38.4–49.9)
HGB: 15.4 g/dL (ref 13.0–17.1)
LYMPH%: 19.3 % (ref 14.0–49.0)
MCH: 31 pg (ref 27.2–33.4)
MCHC: 34.5 g/dL (ref 32.0–36.0)
MCV: 89.8 fL (ref 79.3–98.0)
MONO#: 0.4 10*3/uL (ref 0.1–0.9)
MONO%: 6.1 % (ref 0.0–14.0)
NEUT#: 4.1 10*3/uL (ref 1.5–6.5)
NEUT%: 71.6 % (ref 39.0–75.0)
Platelets: 196 10*3/uL (ref 140–400)
RBC: 4.99 10*6/uL (ref 4.20–5.82)
RDW: 14.2 % (ref 11.0–14.6)
WBC: 5.8 10*3/uL (ref 4.0–10.3)
lymph#: 1.1 10*3/uL (ref 0.9–3.3)

## 2012-10-26 LAB — COMPREHENSIVE METABOLIC PANEL (CC13)
ALT: 18 U/L (ref 0–55)
AST: 15 U/L (ref 5–34)
Albumin: 3.6 g/dL (ref 3.5–5.0)
Alkaline Phosphatase: 53 U/L (ref 40–150)
BUN: 16.3 mg/dL (ref 7.0–26.0)
CO2: 27 mEq/L (ref 22–29)
Calcium: 9.1 mg/dL (ref 8.4–10.4)
Chloride: 109 mEq/L — ABNORMAL HIGH (ref 98–107)
Creatinine: 1.2 mg/dL (ref 0.7–1.3)
Glucose: 107 mg/dl — ABNORMAL HIGH (ref 70–99)
Potassium: 4.3 mEq/L (ref 3.5–5.1)
Sodium: 145 mEq/L (ref 136–145)
Total Bilirubin: 0.6 mg/dL (ref 0.20–1.20)
Total Protein: 6.7 g/dL (ref 6.4–8.3)

## 2012-10-26 NOTE — Progress Notes (Signed)
Hematology and Oncology Follow Up Visit  Clifford [name] [id number] Jun 22, 1945 67 y.o. 10/26/2012 9:20 AM CC: Clifford [name], M.D.  Clifford Dalton. Sherene Sires, MD, FCCP  Clifford [name], M.D.     Principle Diagnosis:This is a pleasant 67 year old gentleman with a diagnosis of non-Hodgkin's lymphoma, presented with pleural effusion, pleural-based nodules.  Initial diagnosis was in 2007.   Prior Therapy: 1. Status post pleurodesis and biopsy of the pleural-based nodule. 2. Received 6 cycles of CVP with rituximab. Therapy concluded in December 2007.  Continued to be in remission since that time.  Current therapy: Observation, surveillance.  Interim History:  Clifford [name] presents today for a followup visit.  He has continued to do very well without any evidence to suggest recurrent disease.  He has continued to be very active without really any decline in his energy or performance status.  Did not report any chest pain.  Did not report any shortness of breath.  Had not reported any difficulty breathing.  Had not reported any major changes in his performance status.  Had not reported any cough, fevers or hemoptysis or any constitutional symptoms. He continues to have problems with kidney stones and follows up with Dr. Manon [name]. Last episode in 07/2012. He had a CT scan and showed no enlarged lymh nodes at that times.He continued to work full time and golf regularly.   Medications: I have reviewed the patient's current medications.  Current Outpatient Prescriptions  Medication Sig Dispense Refill  . aspirin 81 MG tablet Take 81 mg by mouth daily.        Marland Kitchen HYDROcodone-acetaminophen (NORCO/VICODIN) 5-325 MG per tablet Take 1 tablet by mouth every 6 (six) hours as needed for pain.      Marland Kitchen oxyCODONE-acetaminophen (PERCOCET/ROXICET) 5-325 MG per tablet Take 2 tablets by mouth every 4 (four) hours as needed for pain.  15 tablet  0   No current facility-administered medications for this visit.    Allergies: No  Known Allergies  Past Medical History, Surgical history, Social history, and Family History were reviewed and updated.  Review of Systems: Constitutional:  Negative for fever, chills, night sweats, anorexia, weight loss, pain. Cardiovascular: no chest pain or dyspnea on exertion Respiratory: no cough, shortness of breath, or wheezing Neurological: no TIA or stroke symptoms Dermatological: negative ENT: negative Skin Gastrointestinal: positive for - abdominal pain Genito-Urinary: positive for - hematuria Hematological and Lymphatic: negative Breast: negative Musculoskeletal: negative Remaining ROS negative. Physical Exam: Blood pressure 192/104, pulse 86, temperature 98 F (36.7 C), temperature source Oral, resp. rate 18, height 6' (1.829 m), weight 224 lb 3.2 oz (101.696 kg). ECOG: 0 General appearance: alert Head: Normocephalic, without obvious abnormality, atraumatic Neck: no adenopathy, no carotid bruit, no JVD, supple, symmetrical, trachea midline and thyroid not enlarged, symmetric, no tenderness/mass/nodules Lymph nodes: Cervical, supraclavicular, and axillary nodes normal. Heart:regular rate and rhythm, S1, S2 normal, no murmur, click, rub or gallop Lung:chest clear, no wheezing, rales, normal symmetric air entry, Heart exam - S1, S2 normal, no murmur, no gallop, rate regular Abdomin: soft, non-tender, without masses or organomegaly EXT:no erythema, induration, or nodules   Lab Results: Lab Results  Component Value Date   WBC 5.8 10/26/2012   HGB 15.4 10/26/2012   HCT 44.8 10/26/2012   MCV 89.8 10/26/2012   PLT 196 10/26/2012     Chemistry      Component Value Date/Time   NA 142 10/20/2011 0725   NA 146* 10/15/2010 0812   K 4.4 10/20/2011 0725  K 5.3* 10/15/2010 0812   CL 107 10/20/2011 0725   CL 100 10/15/2010 0812   CO2 27 10/20/2011 0725   CO2 30 10/15/2010 0812   BUN 16 10/20/2011 0725   BUN 15 10/15/2010 0812   CREATININE 0.98 10/20/2011 0725   CREATININE 0.7 10/15/2010  0812      Component Value Date/Time   CALCIUM 9.4 10/20/2011 0725   CALCIUM 9.1 10/15/2010 0812   ALKPHOS 45 10/20/2011 0725   ALKPHOS 36 10/15/2010 0812   AST 16 10/20/2011 0725   AST 22 10/15/2010 0812   ALT 21 10/20/2011 0725   BILITOT 0.8 10/20/2011 0725   BILITOT 1.00 10/15/2010 0812      Impression and Plan: 67 year old gentleman with the following issues. 1. Non-Hodgkin's lymphoma.  He is status post systemic chemotherapy completed in December 2007 without any evidence of recurrent disease.  CT scan of neck, chest, abdomen and pelvis from 10/2011 showed really no evidence of any recurrent or relapsed disease. CT scan from 07/2012 also did not suggest any relapse.  At the time being, we will continue to follow him clinically every 12 months and imaging studies on as needed bases. 2. Flank pain and hematuria: likely due to a stone that has passes. This have subsided.  3. Hypertension: He is following with his PCP for that.       [facility], MD 5/21/20149:20 AM

## 2013-02-24 ENCOUNTER — Other Ambulatory Visit: Payer: Self-pay | Admitting: Urology

## 2013-03-01 ENCOUNTER — Encounter (HOSPITAL_COMMUNITY): Payer: Self-pay | Admitting: Pharmacy Technician

## 2013-03-03 ENCOUNTER — Encounter (HOSPITAL_COMMUNITY): Payer: Self-pay

## 2013-03-03 ENCOUNTER — Encounter (HOSPITAL_COMMUNITY)
Admission: RE | Admit: 2013-03-03 | Discharge: 2013-03-03 | Disposition: A | Discharge status: Home / Self Care | Payer: Managed Care, Other (non HMO) | Source: Ambulatory Visit | Attending: Urology | Admitting: Urology

## 2013-03-03 HISTORY — DX: Adverse effect of unspecified anesthetic, initial encounter: T41.45XA

## 2013-03-03 HISTORY — DX: Other complications of anesthesia, initial encounter: T88.59XA

## 2013-03-03 LAB — BASIC METABOLIC PANEL
BUN: 15 mg/dL (ref 6–23)
CO2: 29 mEq/L (ref 19–32)
Calcium: 9.8 mg/dL (ref 8.4–10.5)
Chloride: 101 mEq/L (ref 96–112)
Creatinine, Ser: 1.44 mg/dL — ABNORMAL HIGH (ref 0.50–1.35)
GFR calc Af Amer: 57 mL/min — ABNORMAL LOW (ref >90)
GFR calc non Af Amer: 50 mL/min — ABNORMAL LOW (ref >90)
Glucose, Bld: 160 mg/dL — ABNORMAL HIGH (ref 70–99)
Potassium: 4 mEq/L (ref 3.5–5.1)
Sodium: 140 mEq/L (ref 135–145)

## 2013-03-03 LAB — CBC
HCT: 45.9 % (ref 39.0–52.0)
Hemoglobin: 16.5 g/dL (ref 13.0–17.0)
MCH: 31.7 pg (ref 26.0–34.0)
MCHC: 35.9 g/dL (ref 30.0–36.0)
MCV: 88.3 fL (ref 78.0–100.0)
Platelets: 205 10*3/uL (ref 150–400)
RBC: 5.2 MIL/uL (ref 4.22–5.81)
RDW: 13.3 % (ref 11.5–15.5)
WBC: 8.5 10*3/uL (ref 4.0–10.5)

## 2013-03-03 NOTE — Progress Notes (Signed)
CT abdomen 07/2012 on EPIC

## 2013-03-03 NOTE — Patient Instructions (Addendum)
[address] IT[address]  03/03/2013   Your procedure is scheduled on: 03/06/13  Report to [facility] at 5:15 AM.  Call this number if you have problems the morning of surgery [phone]: [phone]   Remember:   Do not eat food or drink liquids After Midnight.     Do not wear jewelry, make-up or nail polish.  Do not wear lotions, powders, or perfumes. You may wear deodorant.  Do not shave 48 hours prior to surgery. Men may shave face and neck.  Do not bring valuables to the hospital.  Contacts, dentures or bridgework may not be worn into surgery.    Patients discharged the day of surgery will not be allowed to drive home.  Name and phone number of your driver: Lucendia Herrlich (wife) [phone]   Birdie Sons, RN  pre op nurse call if needed [phone]    FAILURE TO FOLLOW THESE INSTRUCTIONS MAY RESULT IN CANCELLATION OF YOUR SURGERY   Patient Signature: ___________________________________________

## 2013-03-03 NOTE — H&P (Signed)
History of Present Illness     Mr. Onder presents for her routine six-month followup. He is currently 67 years of age. The patient has a prior history of nephrolithiasis and gross hematuria. He had a ureteral calculus diagnosed earlier this year.  I subsequent saw him in March of 2014. At that time he was asymptomatic and on KUB I could not definitively see the stone. He tells me that intermittently he continued to have occasional twinges of pain and also a few episodes of hematuria. Several weeks ago he noticed what he thought was the passage of a stone in the toilet but he was unable to get the questionable stone. He has been asymptomatic the last several weeks. Urinalysis today is crystal clear. He did have hydronephrosis noted on his original CT scan. I will like him to have additional KUB and limited left-sided renal ultrasonography today.   Past Medical History Problems  1. History of  Anxiety (Symptom) 300.00 2. History of  Hyperlipidemia 272.4 3. History of  Hypertension 401.9 4. History of  Non-Hodgkin's Lymphoma 202.80 5. History of  Overweight 278.02  Surgical History Problems  1. History of  Lung Surgery  Current Meds 1. Adult Aspirin Low Strength 81 MG Oral Tablet Dispersible; Therapy: (Recorded:03Dec2012) to 2. Oxycodone-Acetaminophen 5-325 MG Oral Tablet; TAKE 1 TABLET EVERY 6 HOURS AS  NEEDED FOR PAIN; Therapy: 28Feb2014 to (Evaluate:07Mar2014); Last Rx:28Feb2014 3. Tamsulosin HCl 0.4 MG Oral Capsule; TAKE ONE CAPSULE BY MOUTH EVERY DAY; Therapy:  23Feb2014 to (Evaluate:05Jul2014)  Requested for: 07May2014; Last Rx:06May2014  Allergies Medication  1. No Known Drug Allergies  Family History Problems  1. Paternal history of  Arteriosclerotic Cardiovascular Disease (ASCVD) 2. Sororal history of  Brain Cancer V16.8 3. Paternal history of  Death In The Family Father CVA 4. Maternal history of  Family Health Status - Mother's Age 68yrs 5. Family history of  Family Health  Status Number Of Children 2 children  Social History Problems  1. Alcohol Use beer qd, ETOH 2 or more a wk 2. Caffeine Use 2 qd 3. Marital History - Currently Married 4. Occupation: Airline pilot 5. Tobacco Use 305.1 cigars for 3yrs, nonsmoker for the past 9 1/2 yrs  Review of Systems Genitourinary, constitutional, skin, eye, otolaryngeal, hematologic/lymphatic, cardiovascular, pulmonary, endocrine, musculoskeletal, gastrointestinal, neurological and psychiatric system(s) were reviewed and pertinent findings if present are noted.  Genitourinary: erectile dysfunction, but no hematuria.  Gastrointestinal: no nausea, no flank pain and no abdominal pain.  Integumentary: skin rash/lesion, but no pruritus.  ENT: sore throat.  Respiratory: cough.    Vitals Vital Signs [Data Includes: Last 1 Day]  18Sep2014 01:30PM  Blood Pressure: 178 / 103 Temperature: 98.4 F Heart Rate: 112  Physical Exam Constitutional: Well nourished and well developed . No acute distress.  Neck: The appearance of the neck is normal and no neck mass is present.  Pulmonary: No respiratory distress and normal respiratory rhythm and effort.  Cardiovascular: Heart rate and rhythm are normal . No peripheral edema.  Abdomen: The abdomen is soft and nontender. No masses are palpated. No CVA tenderness. No hernias are palpable. No hepatosplenomegaly noted.  Skin: Normal skin turgor, no visible rash and no visible skin lesions.  Neuro/Psych:. Mood and affect are appropriate.    Results/Data  23 Feb 2013 1:07 PM   UA With REFLEX       COLOR AMBER       APPEARANCE CLEAR       SPECIFIC GRAVITY 1.025  pH 5.5       GLUCOSE NEG       BILIRUBIN NEG       KETONE NEG       BLOOD LARGE       PROTEIN TRACE       UROBILINOGEN 1       NITRITE NEG       LEUKOCYTE ESTERASE NEG       SQUAMOUS EPITHELIAL/HPF RARE       WBC 0-2       CRYSTALS NONE SEEN       CASTS NONE SEEN       RBC 7-10       BACTERIA RARE       Assessment Assessed  1. Nephrolithiasis 592.0 2. Ureteral Stone 592.1 3. Gross Hematuria 599.71  Plan  Nephrolithiasis (592.0)  1. RENAL U/S LEFT  Done: 18Sep2014 12:00AM 2. Follow-up Schedule Surgery Office  Follow-up  Requested for: 18Sep2014  KUB  Status: Resulted - Requires Verification  Done: 01Jan0001 12:00AM Ordered Today; For: Nephrolithiasis (592.0); Ordered By: Barron Alvine  Due: 20Sep2014 Marked Important; Last Updated By: Evette Cristal   Discussion/Summary   Imaging studies: KUB does not demonstrate a definitive calcification. Left renal ultrasonography shows hydronephrosis of the left kidney that is moderate in nature with dilation of the proximal ureter.  Because of the ongoing hydronephrosis, I went ahead with a stone protocol CT. The official report remains pending. I see clear evidence of a 7 mm partially obstructing stone in the distal left ureter. This is almost certainly the stone that was diagnosed in February that was in the proximal ureter. He has had a stone now for 6-8 months. He is having moderate hydronephrosis and at this time I think it is quite critical that we go ahead and definitively intervene with this stone. I would recommend outpatient ureteroscopy with Holmium laser lithotripsy and possible double-J stent placement. That surgery was discussed with him in detail. We will try to set this up for sometime in the next few weeks with routine followup set up as well. We should have at least a 95% chance of success given the location of the stone at this time. I have encouraged him to strain the urine and if the stone does pass spontaneously, be delighted to cancel the procedure.    Signatures Electronically signed by : Barron Alvine, M.D.; Feb 24 2013  7:44AM

## 2013-03-05 NOTE — Anesthesia Preprocedure Evaluation (Addendum)
Anesthesia Evaluation  Patient identified by MRN, date of birth, ID band Patient awake    Reviewed: Allergy & Precautions, H&P , NPO status , Patient's Chart, lab work & pertinent test results  History of Anesthesia Complications (+) DIFFICULT AIRWAY  Airway Mallampati: III TM Distance: <3 FB Neck ROM: Full    Dental  (+) Poor Dentition, Dental Advisory Given and Missing,    Pulmonary neg pulmonary ROS,  Thoracentesis for prior "cancer surgery". breath sounds clear to auscultation  Pulmonary exam normal       Cardiovascular hypertension, negative cardio ROS  Rhythm:Regular Rate:Normal     Neuro/Psych negative neurological ROS  negative psych ROS   GI/Hepatic negative GI ROS, Neg liver ROS,   Endo/Other  negative endocrine ROS  Renal/GU negative Renal ROS  negative genitourinary   Musculoskeletal negative musculoskeletal ROS (+)   Abdominal   Peds  Hematology negative hematology ROS (+) NonHodgkins Lymphoma   Anesthesia Other Findings   Reproductive/Obstetrics                          Anesthesia Physical Anesthesia Plan  ASA: II  Anesthesia Plan: General   Post-op Pain Management:    Induction: Intravenous  Airway Management Planned: LMA  Additional Equipment:   Intra-op Plan:   Post-operative Plan: Extubation in OR  Informed Consent: I have reviewed the patients History and Physical, chart, labs and discussed the procedure including the risks, benefits and alternatives for the proposed anesthesia with the patient or authorized representative who has indicated his/her understanding and acceptance.   Dental advisory given  Plan Discussed with: CRNA  Anesthesia Plan Comments:         Anesthesia Quick Evaluation

## 2013-03-06 ENCOUNTER — Encounter (HOSPITAL_COMMUNITY): Payer: Self-pay | Admitting: Anesthesiology

## 2013-03-06 ENCOUNTER — Encounter (HOSPITAL_COMMUNITY)
Admission: RE | Disposition: A | Discharge status: Home / Self Care | Payer: Self-pay | Source: Ambulatory Visit | Attending: Urology

## 2013-03-06 ENCOUNTER — Encounter (HOSPITAL_COMMUNITY): Payer: Self-pay | Admitting: *Deleted

## 2013-03-06 ENCOUNTER — Ambulatory Visit (HOSPITAL_COMMUNITY)
Admission: RE | Admit: 2013-03-06 | Discharge: 2013-03-06 | Disposition: A | Discharge status: Home / Self Care | Payer: Managed Care, Other (non HMO) | Source: Ambulatory Visit | Attending: Urology | Admitting: Urology

## 2013-03-06 ENCOUNTER — Ambulatory Visit (HOSPITAL_COMMUNITY): Payer: Managed Care, Other (non HMO) | Admitting: Anesthesiology

## 2013-03-06 DIAGNOSIS — Z7982 Long term (current) use of aspirin: Secondary | ICD-10-CM | POA: Insufficient documentation

## 2013-03-06 DIAGNOSIS — R310 Gross hematuria: Secondary | ICD-10-CM | POA: Insufficient documentation

## 2013-03-06 DIAGNOSIS — Z79899 Other long term (current) drug therapy: Secondary | ICD-10-CM | POA: Insufficient documentation

## 2013-03-06 DIAGNOSIS — Z01810 Encounter for preprocedural cardiovascular examination: Secondary | ICD-10-CM | POA: Insufficient documentation

## 2013-03-06 DIAGNOSIS — Z87898 Personal history of other specified conditions: Secondary | ICD-10-CM | POA: Insufficient documentation

## 2013-03-06 DIAGNOSIS — N200 Calculus of kidney: Secondary | ICD-10-CM | POA: Insufficient documentation

## 2013-03-06 DIAGNOSIS — N201 Calculus of ureter: Secondary | ICD-10-CM | POA: Insufficient documentation

## 2013-03-06 DIAGNOSIS — I10 Essential (primary) hypertension: Secondary | ICD-10-CM | POA: Insufficient documentation

## 2013-03-06 DIAGNOSIS — F17200 Nicotine dependence, unspecified, uncomplicated: Secondary | ICD-10-CM | POA: Insufficient documentation

## 2013-03-06 DIAGNOSIS — E785 Hyperlipidemia, unspecified: Secondary | ICD-10-CM | POA: Insufficient documentation

## 2013-03-06 DIAGNOSIS — Z01812 Encounter for preprocedural laboratory examination: Secondary | ICD-10-CM | POA: Insufficient documentation

## 2013-03-06 HISTORY — PX: CYSTOSCOPY WITH STENT PLACEMENT: SHX5790

## 2013-03-06 HISTORY — PX: CYSTOSCOPY/RETROGRADE/URETEROSCOPY: SHX5316

## 2013-03-06 SURGERY — CYSTOSCOPY/RETROGRADE/URETEROSCOPY
Anesthesia: General | Laterality: Left

## 2013-03-06 MED ORDER — HYDROCODONE-ACETAMINOPHEN 5-325 MG PO TABS: 1.0000 | ORAL_TABLET | Freq: Four times a day (QID) | ORAL | Status: DC | PRN

## 2013-03-06 MED ORDER — ONDANSETRON HCL 4 MG/2ML IJ SOLN
INTRAMUSCULAR | Status: DC | PRN
  Administered 2013-03-06: 4 mg via INTRAVENOUS

## 2013-03-06 MED ORDER — LACTATED RINGERS IV SOLN: INTRAVENOUS | Status: DC

## 2013-03-06 MED ORDER — CIPROFLOXACIN IN D5W 400 MG/200ML IV SOLN
INTRAVENOUS | Status: AC
  Filled 2013-03-06: qty 200

## 2013-03-06 MED ORDER — SODIUM CHLORIDE 0.9 % IR SOLN
Status: DC | PRN
  Administered 2013-03-06: 3000 mL via INTRAVESICAL

## 2013-03-06 MED ORDER — PROMETHAZINE HCL 25 MG/ML IJ SOLN: 6.2500 mg | INTRAMUSCULAR | Status: DC | PRN

## 2013-03-06 MED ORDER — LACTATED RINGERS IV SOLN
INTRAVENOUS | Status: DC | PRN
  Administered 2013-03-06: 08:00:00 via INTRAVENOUS

## 2013-03-06 MED ORDER — CIPROFLOXACIN IN D5W 400 MG/200ML IV SOLN
400.0000 mg | INTRAVENOUS | Status: AC
  Administered 2013-03-06: 400 mg via INTRAVENOUS

## 2013-03-06 MED ORDER — LIDOCAINE HCL 2 % EX GEL
CUTANEOUS | Status: DC | PRN
  Administered 2013-03-06: 1 via URETHRAL

## 2013-03-06 MED ORDER — LABETALOL HCL 5 MG/ML IV SOLN
INTRAVENOUS | Status: DC | PRN
  Administered 2013-03-06 (×2): 5 mg via INTRAVENOUS

## 2013-03-06 MED ORDER — LIDOCAINE HCL 2 % EX GEL
CUTANEOUS | Status: AC
  Filled 2013-03-06: qty 10

## 2013-03-06 MED ORDER — HYDROMORPHONE HCL PF 1 MG/ML IJ SOLN: 0.2500 mg | INTRAMUSCULAR | Status: DC | PRN

## 2013-03-06 MED ORDER — URIBEL 118 MG PO CAPS: 1.0000 | ORAL_CAPSULE | Freq: Three times a day (TID) | ORAL | Status: DC | PRN

## 2013-03-06 MED ORDER — LIDOCAINE HCL (CARDIAC) 20 MG/ML IV SOLN
INTRAVENOUS | Status: DC | PRN
  Administered 2013-03-06: 60 mg via INTRAVENOUS

## 2013-03-06 MED ORDER — BELLADONNA ALKALOIDS-OPIUM 16.2-60 MG RE SUPP
RECTAL | Status: AC
  Filled 2013-03-06: qty 1

## 2013-03-06 MED ORDER — MIDAZOLAM HCL 5 MG/5ML IJ SOLN
INTRAMUSCULAR | Status: DC | PRN
  Administered 2013-03-06: 2 mg via INTRAVENOUS

## 2013-03-06 MED ORDER — PROPOFOL 10 MG/ML IV BOLUS
INTRAVENOUS | Status: DC | PRN
  Administered 2013-03-06: 30 mg via INTRAVENOUS
  Administered 2013-03-06: 200 mg via INTRAVENOUS
  Administered 2013-03-06: 30 mg via INTRAVENOUS

## 2013-03-06 MED ORDER — FENTANYL CITRATE 0.05 MG/ML IJ SOLN
INTRAMUSCULAR | Status: DC | PRN
  Administered 2013-03-06: 25 ug via INTRAVENOUS
  Administered 2013-03-06: 50 ug via INTRAVENOUS

## 2013-03-06 SURGICAL SUPPLY — 21 items
BAG URO CATCHER STRL LF (DRAPE) ×2 IMPLANT
BASKET ZERO TIP NITINOL 2.4FR (BASKET) ×1 IMPLANT
BSKT STON RTRVL ZERO TP 2.4FR (BASKET) ×1
CATH INTERMIT  6FR 70CM (CATHETERS) ×2 IMPLANT
CATH URET 5FR 28IN CONE TIP (BALLOONS)
CATH URET 5FR 28IN OPEN ENDED (CATHETERS) ×2 IMPLANT
CATH URET 5FR 70CM CONE TIP (BALLOONS) IMPLANT
CLOTH BEACON ORANGE TIMEOUT ST (SAFETY) ×2 IMPLANT
DRAPE CAMERA CLOSED 9X96 (DRAPES) ×2 IMPLANT
GLOVE BIOGEL M STRL SZ7.5 (GLOVE) ×2 IMPLANT
GLOVE SURG SS PI 8.0 STRL IVOR (GLOVE) ×2 IMPLANT
GOWN PREVENTION PLUS LG XLONG (DISPOSABLE) ×2 IMPLANT
GOWN PREVENTION PLUS XLARGE (GOWN DISPOSABLE) ×2 IMPLANT
GOWN STRL REIN XL XLG (GOWN DISPOSABLE) ×2 IMPLANT
MANIFOLD NEPTUNE II (INSTRUMENTS) ×2 IMPLANT
MARKER SKIN DUAL TIP RULER LAB (MISCELLANEOUS) ×2 IMPLANT
PACK CYSTO (CUSTOM PROCEDURE TRAY) ×2 IMPLANT
SHEATH URET ACCESS 12FR/35CM (UROLOGICAL SUPPLIES) ×1 IMPLANT
STENT CONTOUR 6FRX24X.038 (STENTS) ×1 IMPLANT
TUBING CONNECTING 10 (TUBING) ×2 IMPLANT
WIRE COONS/BENSON .038X145CM (WIRE) ×2 IMPLANT

## 2013-03-06 NOTE — Progress Notes (Signed)
Phase 2. Up to BR and voided moderate am cherry red urine and tolerated this well

## 2013-03-06 NOTE — Interval H&P Note (Signed)
History and Physical Interval Note:  03/06/2013 7:33 AM  Clifford [name]  has presented today for surgery, with the diagnosis of Left Distal Ureteral Calculus  The various methods of treatment have been discussed with the patient and family. After consideration of risks, benefits and other options for treatment, the patient has consented to  Procedure([name]): CYSTOSCOPY/RETROGRADE/URETEROSCOPY (Left) HOLMIUM LASER APPLICATION (Left) CYSTOSCOPY WITH STENT PLACEMENT POSSIBLE STENT PLACEMENT (Left) as a surgical intervention .  The patient'[name] history has been reviewed, patient examined, no change in status, stable for surgery.  I have reviewed the patient'[name] chart and labs.  Questions were answered to the patient'[name] satisfaction.     Clifford [name]

## 2013-03-06 NOTE — Op Note (Signed)
Preoperative diagnosis: Left distal ureteral calculus Postoperative diagnosis: Same  Procedure: Cystoscopy, left ureteroscopy, stone basketing, double-J stent placement   Surgeon: Valetta Fuller M.D.  Anesthesia: Gen.  Indications: 67 year old male who was originally diagnosed with a left ureteral calculus in March of 2014. His pain resolved and we thought that potentially the stone had indeed passed. We obtained a followup ultrasound which showed some ongoing hydronephrosis. For that reason a repeat CT scan was performed which demonstrated continued presence of ureteral calculus now in the distal left ureter. Stone was 7 mm in size. We felt that definitive intervention was doubly indicated. We recommended ureteroscopy. Full informed consent has been obtained.     Technique and findings: Patient was brought the operating room. He had successful induction general anesthesia was placed in lithotomy position prepped and draped usual manner. He received perioperative antibiotics and placement of PAS compression boots. Appropriate surgical timeout was performed. Cystoscopy revealed unremarkable anterior urethra. Prostatic urethra showed mild trilobar hyperplasia without significant visual obstruction. The left ureteral orifice was quite tight.  I initially attempted to insert an open-ended catheter and for retrograde. Intubation of the orifice was difficult and therefore a wire was placed. Immediately we had the return of significant cloudy fluid along with diffuse stone particles. This suggested ongoing obstruction. The guidewire was placed the left renal pelvis. One step dilation the distal ureter was performed with the inside section of an access sheath. The ureter was engaged with a 6.4 [place] ureteroscope. A 7 x 5 mm stone was encountered in the distal left ureter. There was moderate inflammatory changes. Stone was able to be basket extracted without difficulty. We decided to place a double-J stent and a 6  French 24 cm double-J stent with a dangle string was placed over the guidewire with visual as well as fluoroscopic guidance. A dangle string was secured to the patient's penis. Lidocaine jelly was instilled. The patient was brought to recovery room stable condition having no obvious complications. The stone will be taken to our office and sent for analysis.

## 2013-03-06 NOTE — Progress Notes (Signed)
Arrival to Phase 2 from PACU pt had stent in place with sting attached to penis with pink tape small amt of bleeding at tip of penis

## 2013-03-06 NOTE — Anesthesia Postprocedure Evaluation (Signed)
Anesthesia Post Note  Patient: Clifford [name]  Procedure(s) Performed: Procedure(s) (LRB): CYSTOSCOPY/RETROGRADE/URETEROSCOPY (Left) CYSTOSCOPY WITH STENT PLACEMENT POSSIBLE STENT PLACEMENT (Left)  Anesthesia type: General  Patient location: PACU  Post pain: Pain level controlled  Post assessment: Post-op Vital signs reviewed  Last Vitals:  Filed Vitals:   03/06/13 0952  BP: 142/87  Pulse: 76  Temp: 36.1 C  Resp: 16    Post vital signs: Reviewed  Level of consciousness: sedated  Complications: No apparent anesthesia complications

## 2013-03-06 NOTE — Transfer of Care (Signed)
Immediate Anesthesia Transfer of Care Note  Patient: Clifford [name]  Procedure(s) Performed: Procedure(s) (LRB): CYSTOSCOPY/RETROGRADE/URETEROSCOPY (Left) CYSTOSCOPY WITH STENT PLACEMENT POSSIBLE STENT PLACEMENT (Left)  Patient Location: PACU  Anesthesia Type: General  Level of Consciousness: sedated, patient cooperative and responds to stimulation  Airway & Oxygen Therapy: Patient Spontanous Breathing and Patient connected to face mask oxgen  Post-op Assessment: Report given to PACU RN and Post -op Vital signs reviewed and stable  Post vital signs: Reviewed and stable  Complications: No apparent anesthesia complications

## 2013-03-07 ENCOUNTER — Encounter (HOSPITAL_COMMUNITY): Payer: Self-pay | Admitting: Urology

## 2013-04-13 ENCOUNTER — Other Ambulatory Visit: Payer: Self-pay

## 2013-05-25 ENCOUNTER — Other Ambulatory Visit: Payer: Self-pay | Admitting: Gastroenterology

## 2013-10-26 ENCOUNTER — Encounter: Payer: Self-pay | Admitting: Oncology

## 2013-10-26 ENCOUNTER — Other Ambulatory Visit (HOSPITAL_BASED_OUTPATIENT_CLINIC_OR_DEPARTMENT_OTHER): Payer: Managed Care, Other (non HMO)

## 2013-10-26 ENCOUNTER — Ambulatory Visit (HOSPITAL_BASED_OUTPATIENT_CLINIC_OR_DEPARTMENT_OTHER): Payer: Managed Care, Other (non HMO) | Admitting: Oncology

## 2013-10-26 ENCOUNTER — Telehealth: Payer: Self-pay | Admitting: Oncology

## 2013-10-26 VITALS — BP 142/78 | HR 82 | Temp 97.7°F | Resp 20 | Ht 73.0 in | Wt 213.8 lb

## 2013-10-26 DIAGNOSIS — C8299 Follicular lymphoma, unspecified, extranodal and solid organ sites: Secondary | ICD-10-CM

## 2013-10-26 DIAGNOSIS — I10 Essential (primary) hypertension: Secondary | ICD-10-CM

## 2013-10-26 DIAGNOSIS — C8589 Other specified types of non-Hodgkin lymphoma, extranodal and solid organ sites: Secondary | ICD-10-CM

## 2013-10-26 DIAGNOSIS — R319 Hematuria, unspecified: Secondary | ICD-10-CM

## 2013-10-26 LAB — CBC WITH DIFFERENTIAL/PLATELET
BASO%: 1.1 % (ref 0.0–2.0)
Basophils Absolute: 0.1 10*3/uL (ref 0.0–0.1)
EOS%: 2.1 % (ref 0.0–7.0)
Eosinophils Absolute: 0.1 10*3/uL (ref 0.0–0.5)
HCT: 44.9 % (ref 38.4–49.9)
HGB: 15.4 g/dL (ref 13.0–17.1)
LYMPH%: 24.3 % (ref 14.0–49.0)
MCH: 31.3 pg (ref 27.2–33.4)
MCHC: 34.4 g/dL (ref 32.0–36.0)
MCV: 91 fL (ref 79.3–98.0)
MONO#: 0.4 10*3/uL (ref 0.1–0.9)
MONO%: 7.2 % (ref 0.0–14.0)
NEUT#: 3.3 10*3/uL (ref 1.5–6.5)
NEUT%: 65.3 % (ref 39.0–75.0)
Platelets: 189 10*3/uL (ref 140–400)
RBC: 4.93 10*6/uL (ref 4.20–5.82)
RDW: 14.2 % (ref 11.0–14.6)
WBC: 5.1 10*3/uL (ref 4.0–10.3)
lymph#: 1.2 10*3/uL (ref 0.9–3.3)

## 2013-10-26 LAB — COMPREHENSIVE METABOLIC PANEL (CC13)
ALT: 27 U/L (ref 0–55)
AST: 19 U/L (ref 5–34)
Albumin: 3.7 g/dL (ref 3.5–5.0)
Alkaline Phosphatase: 51 U/L (ref 40–150)
Anion Gap: 11 mEq/L (ref 3–11)
BUN: 15.9 mg/dL (ref 7.0–26.0)
CO2: 27 mEq/L (ref 22–29)
Calcium: 9.3 mg/dL (ref 8.4–10.4)
Chloride: 105 mEq/L (ref 98–109)
Creatinine: 1.1 mg/dL (ref 0.7–1.3)
Glucose: 95 mg/dl (ref 70–140)
Potassium: 3.9 mEq/L (ref 3.5–5.1)
Sodium: 143 mEq/L (ref 136–145)
Total Bilirubin: 1.02 mg/dL (ref 0.20–1.20)
Total Protein: 6.6 g/dL (ref 6.4–8.3)

## 2013-10-26 NOTE — Telephone Encounter (Signed)
gv and printed appt sched and avs for pt for May 2016

## 2013-10-26 NOTE — Progress Notes (Signed)
Hematology and Oncology Follow Up Visit  Clifford [name] [id number] [birth year] 68 y.o. 10/26/2013 9:15 AM CC: Clifford [name]. Clifford [name], M.D.  Clifford [name]. Clifford Novas, MD, FCCP  Clifford [name]. Clifford [name], M.D.     Principle Diagnosis:This is a pleasant 68 year old gentleman with a diagnosis of non-Hodgkin's lymphoma, presented with pleural effusion, pleural-based nodules.  Initial diagnosis was in 2007.   Prior Therapy: 1. Status post pleurodesis and biopsy of the pleural-based nodule. 2. Received 6 cycles of CVP with rituximab. Therapy concluded in December 2007.  Continued to be in remission since that time.  Current therapy: Observation, surveillance.  Interim History:  Clifford [name] presents today for a followup visit.  He has continued to do very well without any evidence to suggest recurrent disease.  Since last visit, he developed another kidney stone and required surgical intervention and the removal of a left distal ureteral stone on 03/06/2013 by Dr. Risa Grill. He is no longer reporting any flank pain or hematuria. He is urinating freely. He has continued to be very active without really any decline in his energy or performance status.  Did not report any chest pain.  Did not report any shortness of breath.  Had not reported any difficulty breathing.  Had not reported any major changes in his performance status.  Had not reported any cough, fevers or hemoptysis or any constitutional symptoms. He still plays golf on a regular basis and physically able to do so.  Medications: I have reviewed the patient's current medications.  Current Outpatient Prescriptions  Medication Sig Dispense Refill  . amLODipine (NORVASC) 5 MG tablet Take 25 mg by mouth daily.       . hydrochlorothiazide (HYDRODIURIL) 25 MG tablet Take 25 mg by mouth daily.      Marland Kitchen aspirin 81 MG tablet Take 81 mg by mouth daily.         No current facility-administered medications for this visit.    Allergies: No Known Allergies  Past Medical  History, Surgical history, Social history, and Family History were reviewed and updated.  Review of Systems: Constitutional:  Negative for fever, chills, night sweats, anorexia, weight loss, pain. Cardiovascular: no chest pain or dyspnea on exertion Respiratory: no cough, shortness of breath, or wheezing Neurological: no TIA or stroke symptoms Gastrointestinal: positive for - abdominal pain Remaining ROS negative.  Physical Exam: Blood pressure 142/78, pulse 82, temperature 97.7 F (36.5 C), temperature source Oral, resp. rate 20, height 6\' 1"  (1.854 m), weight 213 lb 12.8 oz (96.979 kg). ECOG: 0 General appearance: alert awake not in any distress. Head: Normocephalic, without obvious abnormality, atraumatic Neck: no adenopathy, no thyroid masses. Heart:regular rate and rhythm, S1, S2 normal, no murmur, click, rub or gallop Lung:chest clear, no wheezing, rales, no dullness to percussion. Abdomin: soft, non-tender, without masses or organomegaly EXT:no erythema, induration, or nodules Lymphatic examination: Showed no lymphadenopathy.  Lab Results: Lab Results  Component Value Date   WBC 5.1 10/26/2013   HGB 15.4 10/26/2013   HCT 44.9 10/26/2013   MCV 91.0 10/26/2013   PLT 189 10/26/2013     Chemistry      Component Value Date/Time   NA 140 03/03/2013 1425   NA 145 10/26/2012 0841   NA 146* 10/15/2010 0812   K 4.0 03/03/2013 1425   K 4.3 10/26/2012 0841   K 5.3* 10/15/2010 0812   CL 101 03/03/2013 1425   CL 109* 10/26/2012 0841   CL 100 10/15/2010 0812   CO2 29 03/03/2013 1425   CO2  27 10/26/2012 0841   CO2 30 10/15/2010 0812   BUN 15 03/03/2013 1425   BUN 16.3 10/26/2012 0841   BUN 15 10/15/2010 0812   CREATININE 1.44* 03/03/2013 1425   CREATININE 1.2 10/26/2012 0841   CREATININE 0.7 10/15/2010 0812      Component Value Date/Time   CALCIUM 9.8 03/03/2013 1425   CALCIUM 9.1 10/26/2012 0841   CALCIUM 9.1 10/15/2010 0812   ALKPHOS 53 10/26/2012 0841   ALKPHOS 45 10/20/2011 0725   ALKPHOS 36  10/15/2010 0812   AST 15 10/26/2012 0841   AST 16 10/20/2011 0725   AST 22 10/15/2010 0812   ALT 18 10/26/2012 0841   ALT 21 10/20/2011 0725   ALT 33 10/15/2010 0812   BILITOT 0.60 10/26/2012 0841   BILITOT 0.8 10/20/2011 0725   BILITOT 1.00 10/15/2010 0812      Impression and Plan: 68 year old gentleman with the following issues. 1. Non-Hodgkin's lymphoma.  He is status post systemic chemotherapy completed in December 2007 without any evidence of recurrent disease.  CT scan of neck, chest, abdomen and pelvis from 10/2011 showed really no evidence of any recurrent or relapsed disease.  At the time being, we will continue to follow him clinically every 12 months and imaging studies on as needed bases. I see no need to repeat any imaging studies at this time. 2. Flank pain and hematuria: This is related to her current kidney stones and he continues to follow with Dr. Risa Grill.  3. Hypertension: He is following with his PCP for that.  4. Followup will be in 12 months.      Wyatt Portela, MD 5/21/20159:15 AM

## 2014-10-25 ENCOUNTER — Telehealth: Payer: Self-pay | Admitting: Oncology

## 2014-10-25 ENCOUNTER — Other Ambulatory Visit (HOSPITAL_BASED_OUTPATIENT_CLINIC_OR_DEPARTMENT_OTHER): Payer: Managed Care, Other (non HMO)

## 2014-10-25 ENCOUNTER — Ambulatory Visit (HOSPITAL_BASED_OUTPATIENT_CLINIC_OR_DEPARTMENT_OTHER): Payer: Managed Care, Other (non HMO) | Admitting: Oncology

## 2014-10-25 VITALS — BP 139/81 | HR 88 | Temp 97.9°F | Resp 20 | Ht 73.0 in | Wt 228.5 lb

## 2014-10-25 DIAGNOSIS — Z8572 Personal history of non-Hodgkin lymphomas: Secondary | ICD-10-CM

## 2014-10-25 DIAGNOSIS — C8599 Non-Hodgkin lymphoma, unspecified, extranodal and solid organ sites: Secondary | ICD-10-CM

## 2014-10-25 DIAGNOSIS — C8590 Non-Hodgkin lymphoma, unspecified, unspecified site: Secondary | ICD-10-CM

## 2014-10-25 DIAGNOSIS — R319 Hematuria, unspecified: Secondary | ICD-10-CM

## 2014-10-25 DIAGNOSIS — R109 Unspecified abdominal pain: Secondary | ICD-10-CM

## 2014-10-25 DIAGNOSIS — I10 Essential (primary) hypertension: Secondary | ICD-10-CM

## 2014-10-25 LAB — COMPREHENSIVE METABOLIC PANEL (CC13)
ALT: 37 U/L (ref 0–55)
AST: 26 U/L (ref 5–34)
Albumin: 3.6 g/dL (ref 3.5–5.0)
Alkaline Phosphatase: 60 U/L (ref 40–150)
Anion Gap: 12 mEq/L — ABNORMAL HIGH (ref 3–11)
BUN: 13.2 mg/dL (ref 7.0–26.0)
CO2: 28 mEq/L (ref 22–29)
Calcium: 8.9 mg/dL (ref 8.4–10.4)
Chloride: 100 mEq/L (ref 98–109)
Creatinine: 1.1 mg/dL (ref 0.7–1.3)
EGFR: 67 mL/min/{1.73_m2} — ABNORMAL LOW (ref >90)
Glucose: 117 mg/dl (ref 70–140)
Potassium: 3.5 mEq/L (ref 3.5–5.1)
Sodium: 141 mEq/L (ref 136–145)
Total Bilirubin: 1.21 mg/dL — ABNORMAL HIGH (ref 0.20–1.20)
Total Protein: 6.5 g/dL (ref 6.4–8.3)

## 2014-10-25 LAB — CBC WITH DIFFERENTIAL/PLATELET
BASO%: 1.2 % (ref 0.0–2.0)
Basophils Absolute: 0.1 10*3/uL (ref 0.0–0.1)
EOS%: 2.4 % (ref 0.0–7.0)
Eosinophils Absolute: 0.1 10*3/uL (ref 0.0–0.5)
HCT: 45.1 % (ref 38.4–49.9)
HGB: 16 g/dL (ref 13.0–17.1)
LYMPH%: 22.3 % (ref 14.0–49.0)
MCH: 31.5 pg (ref 27.2–33.4)
MCHC: 35.4 g/dL (ref 32.0–36.0)
MCV: 89 fL (ref 79.3–98.0)
MONO#: 0.3 10*3/uL (ref 0.1–0.9)
MONO%: 5.9 % (ref 0.0–14.0)
NEUT#: 3.7 10*3/uL (ref 1.5–6.5)
NEUT%: 68.2 % (ref 39.0–75.0)
Platelets: 214 10*3/uL (ref 140–400)
RBC: 5.07 10*6/uL (ref 4.20–5.82)
RDW: 13.6 % (ref 11.0–14.6)
WBC: 5.5 10*3/uL (ref 4.0–10.3)
lymph#: 1.2 10*3/uL (ref 0.9–3.3)

## 2014-10-25 NOTE — Telephone Encounter (Signed)
per pof to sch pt appt-gave pt copy of sch °

## 2014-10-25 NOTE — Progress Notes (Signed)
Hematology and Oncology Follow Up Visit  Clifford [name] [id number] [birth year] 69 y.o. 10/25/2014 9:04 AM   Principle Diagnosis:This is a pleasant 69 year old gentleman with a diagnosis of non-Hodgkin's lymphoma; marginal zone subtype. He presented with pleural effusion, pleural-based nodules. Initial diagnosis was in 2007.   Prior Therapy: 1. Status post pleurodesis and biopsy of the pleural-based nodule. 2. Received 6 cycles of CVP with rituximab. Therapy concluded in December 2007.  Continued to be in remission since that time.  Current therapy: Observation, surveillance.  Interim History:  Clifford [name] presents today for a followup visit.  Since the last visit, he continues to do very well. He works full time without any decline in his energy her performance status. He continues to golf intermittently. He is no longer reporting any flank pain or hematuria. He is urinating freely. Did not report any chest pain.  Did not report any shortness of breath.  Had not reported any difficulty breathing. Had not reported any cough, fevers or hemoptysis or any constitutional symptoms. He does not report any headaches or blurry vision or syncope. He does not report any leg edema or palpitation. He does not report any wheezing or dyspnea on exertion. He does not report any nausea, vomiting, abdominal pain as well as satiety. His appetite is excellent and have gained weight. He does not report any frequency urgency or hematuria. He does not report any skeletal complaints. Remaining review of systems unremarkable.  Medications: I have reviewed the patient's current medications.  Current Outpatient Prescriptions  Medication Sig Dispense Refill  . amLODipine (NORVASC) 5 MG tablet Take 5 mg by mouth daily.     Marland Kitchen aspirin 81 MG tablet Take 81 mg by mouth daily.      . cetirizine (ZYRTEC) 10 MG tablet Take 10 mg by mouth daily as needed for allergies.    . hydrochlorothiazide (HYDRODIURIL) 25 MG tablet Take 25 mg by  mouth daily.     No current facility-administered medications for this visit.    Allergies: No Known Allergies  Past Medical History, Surgical history, Social history, and Family History were reviewed and updated.   Physical Exam: Blood pressure 139/81, pulse 88, temperature 97.9 F (36.6 C), temperature source Oral, resp. rate 20, height '6\' 1"'$  (1.854 m), weight 228 lb 8 oz (103.647 kg), SpO2 97 %. ECOG: 0 General appearance: alert awake well-appearing gentleman. Head: Normocephalic, without obvious abnormality, atraumatic Neck: no adenopathy, no thyroid masses. Heart:regular rate and rhythm, S1, S2 normal, no murmur, click, rub or gallop Lung:chest clear, no wheezing, rales, no dullness to percussion. Abdomin: soft, non-tender, without masses or organomegaly EXT:no erythema, induration, or nodules Lymphatic examination: Showed no lymphadenopathy.  Lab Results: Lab Results  Component Value Date   WBC 5.5 10/25/2014   HGB 16.0 10/25/2014   HCT 45.1 10/25/2014   MCV 89.0 10/25/2014   PLT 214 10/25/2014     Chemistry      Component Value Date/Time   NA 143 10/26/2013 0849   NA 140 03/03/2013 1425   NA 146* 10/15/2010 0812   K 3.9 10/26/2013 0849   K 4.0 03/03/2013 1425   K 5.3* 10/15/2010 0812   CL 101 03/03/2013 1425   CL 109* 10/26/2012 0841   CL 100 10/15/2010 0812   CO2 27 10/26/2013 0849   CO2 29 03/03/2013 1425   CO2 30 10/15/2010 0812   BUN 15.9 10/26/2013 0849   BUN 15 03/03/2013 1425   BUN 15 10/15/2010 0812   CREATININE 1.1 10/26/2013  0849   CREATININE 1.44* 03/03/2013 1425   CREATININE 0.7 10/15/2010 0812      Component Value Date/Time   CALCIUM 9.3 10/26/2013 0849   CALCIUM 9.8 03/03/2013 1425   CALCIUM 9.1 10/15/2010 0812   ALKPHOS 51 10/26/2013 0849   ALKPHOS 45 10/20/2011 0725   ALKPHOS 36 10/15/2010 0812   AST 19 10/26/2013 0849   AST 16 10/20/2011 0725   AST 22 10/15/2010 0812   ALT 27 10/26/2013 0849   ALT 21 10/20/2011 0725   ALT 33  10/15/2010 0812   BILITOT 1.02 10/26/2013 0849   BILITOT 0.8 10/20/2011 0725   BILITOT 1.00 10/15/2010 0812      Impression and Plan: 69 year old gentleman with the following issues. 1. Non-Hodgkin's lymphoma. He had marginal zone subtype after presenting with pleural effusion and pleural-based involvement. He is status post systemic chemotherapy completed in December 2007 without any evidence of recurrent disease.  CT scan of neck, chest, abdomen and pelvis from 10/2011 showed really no evidence of any recurrent or relapsed disease.  At the time being, we will continue to follow him clinically every 12 months and imaging studies on as needed bases. He was educated about signs or symptoms of disease recurrence and he will let me know if any symptoms develop between visits. 2. Flank pain and hematuria: This is related to her current kidney stones and he continues to follow with Dr. Risa Grill.  3. Hypertension: He is following with his PCP for that.  4. Followup will be in 12 months.      [facility], MD 5/19/20169:04 AM

## 2014-12-03 ENCOUNTER — Other Ambulatory Visit: Payer: Self-pay

## 2015-09-02 ENCOUNTER — Emergency Department (HOSPITAL_COMMUNITY)
Admission: EM | Admit: 2015-09-02 | Discharge: 2015-09-02 | Disposition: A | Discharge status: Home / Self Care | Payer: Medicare HMO | Attending: Emergency Medicine | Admitting: Emergency Medicine

## 2015-09-02 ENCOUNTER — Emergency Department (HOSPITAL_COMMUNITY): Payer: Medicare HMO

## 2015-09-02 ENCOUNTER — Encounter (HOSPITAL_COMMUNITY): Payer: Self-pay | Admitting: *Deleted

## 2015-09-02 DIAGNOSIS — Y9289 Other specified places as the place of occurrence of the external cause: Secondary | ICD-10-CM | POA: Insufficient documentation

## 2015-09-02 DIAGNOSIS — W3400XA Accidental discharge from unspecified firearms or gun, initial encounter: Secondary | ICD-10-CM

## 2015-09-02 DIAGNOSIS — S61402A Unspecified open wound of left hand, initial encounter: Secondary | ICD-10-CM | POA: Diagnosis not present

## 2015-09-02 DIAGNOSIS — Y9389 Activity, other specified: Secondary | ICD-10-CM | POA: Diagnosis not present

## 2015-09-02 DIAGNOSIS — S62313B Displaced fracture of base of third metacarpal bone, left hand, initial encounter for open fracture: Secondary | ICD-10-CM | POA: Diagnosis not present

## 2015-09-02 DIAGNOSIS — S62323B Displaced fracture of shaft of third metacarpal bone, left hand, initial encounter for open fracture: Secondary | ICD-10-CM | POA: Insufficient documentation

## 2015-09-02 DIAGNOSIS — Z7982 Long term (current) use of aspirin: Secondary | ICD-10-CM | POA: Diagnosis not present

## 2015-09-02 DIAGNOSIS — Z79899 Other long term (current) drug therapy: Secondary | ICD-10-CM | POA: Diagnosis not present

## 2015-09-02 DIAGNOSIS — Z87891 Personal history of nicotine dependence: Secondary | ICD-10-CM | POA: Diagnosis not present

## 2015-09-02 DIAGNOSIS — Y998 Other external cause status: Secondary | ICD-10-CM | POA: Diagnosis not present

## 2015-09-02 DIAGNOSIS — W231XXA Caught, crushed, jammed, or pinched between stationary objects, initial encounter: Secondary | ICD-10-CM | POA: Diagnosis not present

## 2015-09-02 DIAGNOSIS — Z8572 Personal history of non-Hodgkin lymphomas: Secondary | ICD-10-CM | POA: Diagnosis not present

## 2015-09-02 DIAGNOSIS — I10 Essential (primary) hypertension: Secondary | ICD-10-CM | POA: Diagnosis not present

## 2015-09-02 DIAGNOSIS — S6992XA Unspecified injury of left wrist, hand and finger(s), initial encounter: Secondary | ICD-10-CM | POA: Diagnosis present

## 2015-09-02 DIAGNOSIS — S62309B Unspecified fracture of unspecified metacarpal bone, initial encounter for open fracture: Secondary | ICD-10-CM

## 2015-09-02 DIAGNOSIS — S62393A Other fracture of third metacarpal bone, left hand, initial encounter for closed fracture: Secondary | ICD-10-CM | POA: Diagnosis not present

## 2015-09-02 MED ORDER — CEPHALEXIN 500 MG PO CAPS: 500.0000 mg | ORAL_CAPSULE | Freq: Four times a day (QID) | ORAL | Status: DC

## 2015-09-02 MED ORDER — ONDANSETRON HCL 4 MG/2ML IJ SOLN
4.0000 mg | Freq: Once | INTRAMUSCULAR | Status: AC
  Administered 2015-09-02: 4 mg via INTRAVENOUS
  Filled 2015-09-02: qty 2

## 2015-09-02 MED ORDER — OXYCODONE-ACETAMINOPHEN 5-325 MG PO TABS
ORAL_TABLET | ORAL | Status: AC
  Filled 2015-09-02: qty 1

## 2015-09-02 MED ORDER — CEFAZOLIN SODIUM 1-5 GM-% IV SOLN
1.0000 g | Freq: Once | INTRAVENOUS | Status: AC
  Administered 2015-09-02: 1 g via INTRAVENOUS
  Filled 2015-09-02: qty 50

## 2015-09-02 MED ORDER — OXYCODONE-ACETAMINOPHEN 5-325 MG PO TABS
1.0000 | ORAL_TABLET | ORAL | Status: DC | PRN
  Administered 2015-09-02: 1 via ORAL

## 2015-09-02 MED ORDER — OXYCODONE-ACETAMINOPHEN 5-325 MG PO TABS: 1.0000 | ORAL_TABLET | ORAL | Status: DC | PRN

## 2015-09-02 MED ORDER — HYDROMORPHONE HCL 1 MG/ML IJ SOLN
1.0000 mg | Freq: Once | INTRAMUSCULAR | Status: AC
Start: 2015-09-02 — End: 2015-09-02
  Administered 2015-09-02: 1 mg via INTRAVENOUS
  Filled 2015-09-02: qty 1

## 2015-09-02 MED ORDER — MORPHINE SULFATE (PF) 4 MG/ML IV SOLN
4.0000 mg | Freq: Once | INTRAVENOUS | Status: AC
  Administered 2015-09-02: 4 mg via INTRAVENOUS
  Filled 2015-09-02: qty 1
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
## 2015-09-02 NOTE — Discharge Instructions (Signed)
Dr. Biagio Borg office will call you tomorrow morning to schedule a follow up appointment in clinic. In the meantime take antibiotics as prescribed. You may take Percocet as needed for pain. If you do not need such strong pain medicine you may take tylenol and/or ibuprofen. Return to the ER for new or worsening symptoms.

## 2015-09-04 ENCOUNTER — Encounter (HOSPITAL_BASED_OUTPATIENT_CLINIC_OR_DEPARTMENT_OTHER): Payer: Self-pay | Admitting: *Deleted

## 2015-09-04 ENCOUNTER — Other Ambulatory Visit: Payer: Self-pay | Admitting: Orthopedic Surgery

## 2015-09-04 DIAGNOSIS — S62393B Other fracture of third metacarpal bone, left hand, initial encounter for open fracture: Secondary | ICD-10-CM | POA: Diagnosis not present

## 2015-09-04 NOTE — Progress Notes (Signed)
Chart reviewed by Dr Thomes Cake concerning patient history of difficult intubation. OK for [facility].

## 2015-09-05 ENCOUNTER — Other Ambulatory Visit: Payer: Self-pay

## 2015-09-05 ENCOUNTER — Encounter (HOSPITAL_BASED_OUTPATIENT_CLINIC_OR_DEPARTMENT_OTHER)
Admission: RE | Admit: 2015-09-05 | Discharge: 2015-09-05 | Disposition: A | Discharge status: Home / Self Care | Payer: Medicare HMO | Source: Ambulatory Visit | Attending: Orthopedic Surgery | Admitting: Orthopedic Surgery

## 2015-09-05 DIAGNOSIS — Z7982 Long term (current) use of aspirin: Secondary | ICD-10-CM | POA: Diagnosis not present

## 2015-09-05 DIAGNOSIS — I10 Essential (primary) hypertension: Secondary | ICD-10-CM | POA: Insufficient documentation

## 2015-09-05 DIAGNOSIS — Z01810 Encounter for preprocedural cardiovascular examination: Secondary | ICD-10-CM | POA: Diagnosis not present

## 2015-09-05 DIAGNOSIS — S62303B Unspecified fracture of third metacarpal bone, left hand, initial encounter for open fracture: Secondary | ICD-10-CM | POA: Diagnosis not present

## 2015-09-05 DIAGNOSIS — Z79899 Other long term (current) drug therapy: Secondary | ICD-10-CM | POA: Diagnosis not present

## 2015-09-05 DIAGNOSIS — Z8572 Personal history of non-Hodgkin lymphomas: Secondary | ICD-10-CM | POA: Diagnosis not present

## 2015-09-05 DIAGNOSIS — Z87891 Personal history of nicotine dependence: Secondary | ICD-10-CM | POA: Diagnosis not present

## 2015-09-05 DIAGNOSIS — W320XXA Accidental handgun discharge, initial encounter: Secondary | ICD-10-CM | POA: Diagnosis not present

## 2015-09-05 LAB — BASIC METABOLIC PANEL
Anion gap: 10 (ref 5–15)
BUN: 9 mg/dL (ref 6–20)
CO2: 30 mmol/L (ref 22–32)
Calcium: 9 mg/dL (ref 8.9–10.3)
Chloride: 98 mmol/L — ABNORMAL LOW (ref 101–111)
Creatinine, Ser: 0.96 mg/dL (ref 0.61–1.24)
GFR calc Af Amer: >60 mL/min (ref >60)
GFR calc non Af Amer: >60 mL/min (ref >60)
Glucose, Bld: 86 mg/dL (ref 65–99)
Potassium: 3.6 mmol/L (ref 3.5–5.1)
Sodium: 138 mmol/L (ref 135–145)

## 2015-09-05 NOTE — Anesthesia Preprocedure Evaluation (Addendum)
Anesthesia Evaluation  Patient identified by MRN, date of birth, ID band Patient awake    Reviewed: Allergy & Precautions, H&P , NPO status , Patient's Chart, lab work & pertinent test results  History of Anesthesia Complications (+) DIFFICULT AIRWAY  Airway Mallampati: II  TM Distance: >3 FB Neck ROM: full    Dental  (+) Dental Advisory Given, Missing, Partial Upper Left upper front missing:   Pulmonary neg pulmonary ROS, former smoker,    Pulmonary exam normal breath sounds clear to auscultation       Cardiovascular Exercise Tolerance: Good hypertension, Pt. on medications Normal cardiovascular exam Rhythm:regular Rate:Normal     Neuro/Psych negative neurological ROS  negative psych ROS   GI/Hepatic negative GI ROS, Neg liver ROS,   Endo/Other  negative endocrine ROS  Renal/GU negative Renal ROS  negative genitourinary   Musculoskeletal   Abdominal   Peds  Hematology negative hematology ROS (+) Non-Hodgkins lymphoma   Anesthesia Other Findings   Reproductive/Obstetrics negative OB ROS                            Anesthesia Physical Anesthesia Plan  ASA: III  Anesthesia Plan: General   Post-op Pain Management: GA combined w/ Regional for post-op pain   Induction: Intravenous  Airway Management Planned: LMA  Additional Equipment:   Intra-op Plan:   Post-operative Plan: Extubation in OR  Informed Consent: I have reviewed the patients History and Physical, chart, labs and discussed the procedure including the risks, benefits and alternatives for the proposed anesthesia with the patient or authorized representative who has indicated his/her understanding and acceptance.   Dental advisory given  Plan Discussed with: CRNA  Anesthesia Plan Comments:        Anesthesia Quick Evaluation
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
## 2015-09-06 ENCOUNTER — Encounter (HOSPITAL_BASED_OUTPATIENT_CLINIC_OR_DEPARTMENT_OTHER)
Admission: RE | Disposition: A | Discharge status: Home / Self Care | Payer: Self-pay | Source: Ambulatory Visit | Attending: Orthopedic Surgery

## 2015-09-06 ENCOUNTER — Encounter (HOSPITAL_BASED_OUTPATIENT_CLINIC_OR_DEPARTMENT_OTHER): Payer: Self-pay

## 2015-09-06 ENCOUNTER — Ambulatory Visit (HOSPITAL_BASED_OUTPATIENT_CLINIC_OR_DEPARTMENT_OTHER): Payer: Medicare HMO | Admitting: Anesthesiology

## 2015-09-06 ENCOUNTER — Ambulatory Visit (HOSPITAL_BASED_OUTPATIENT_CLINIC_OR_DEPARTMENT_OTHER)
Admission: RE | Admit: 2015-09-06 | Discharge: 2015-09-06 | Disposition: A | Discharge status: Home / Self Care | Payer: Medicare HMO | Source: Ambulatory Visit | Attending: Orthopedic Surgery | Admitting: Orthopedic Surgery

## 2015-09-06 ENCOUNTER — Ambulatory Visit (HOSPITAL_COMMUNITY): Payer: Medicare HMO

## 2015-09-06 DIAGNOSIS — Z87891 Personal history of nicotine dependence: Secondary | ICD-10-CM | POA: Insufficient documentation

## 2015-09-06 DIAGNOSIS — Z8572 Personal history of non-Hodgkin lymphomas: Secondary | ICD-10-CM | POA: Diagnosis not present

## 2015-09-06 DIAGNOSIS — Z7982 Long term (current) use of aspirin: Secondary | ICD-10-CM | POA: Insufficient documentation

## 2015-09-06 DIAGNOSIS — S62303B Unspecified fracture of third metacarpal bone, left hand, initial encounter for open fracture: Secondary | ICD-10-CM | POA: Insufficient documentation

## 2015-09-06 DIAGNOSIS — S62393B Other fracture of third metacarpal bone, left hand, initial encounter for open fracture: Secondary | ICD-10-CM | POA: Diagnosis not present

## 2015-09-06 DIAGNOSIS — Z419 Encounter for procedure for purposes other than remedying health state, unspecified: Secondary | ICD-10-CM

## 2015-09-06 DIAGNOSIS — S62023A Displaced fracture of middle third of navicular [scaphoid] bone of unspecified wrist, initial encounter for closed fracture: Secondary | ICD-10-CM | POA: Diagnosis not present

## 2015-09-06 DIAGNOSIS — M79642 Pain in left hand: Secondary | ICD-10-CM | POA: Diagnosis not present

## 2015-09-06 DIAGNOSIS — Z79899 Other long term (current) drug therapy: Secondary | ICD-10-CM | POA: Insufficient documentation

## 2015-09-06 DIAGNOSIS — W320XXA Accidental handgun discharge, initial encounter: Secondary | ICD-10-CM | POA: Insufficient documentation

## 2015-09-06 DIAGNOSIS — I10 Essential (primary) hypertension: Secondary | ICD-10-CM | POA: Insufficient documentation

## 2015-09-06 DIAGNOSIS — G8918 Other acute postprocedural pain: Secondary | ICD-10-CM | POA: Diagnosis not present

## 2015-09-06 HISTORY — DX: Accidental discharge from unspecified firearms or gun, initial encounter: W34.00XA

## 2015-09-06 HISTORY — PX: OPEN REDUCTION INTERNAL FIXATION (ORIF) METACARPAL: SHX6234

## 2015-09-06 SURGERY — OPEN REDUCTION INTERNAL FIXATION (ORIF) METACARPAL
Anesthesia: General | Site: Hand | Laterality: Left

## 2015-09-06 MED ORDER — ONDANSETRON HCL 4 MG/2ML IJ SOLN
INTRAMUSCULAR | Status: DC | PRN
  Administered 2015-09-06: 4 mg via INTRAVENOUS

## 2015-09-06 MED ORDER — MIDAZOLAM HCL 2 MG/2ML IJ SOLN
INTRAMUSCULAR | Status: AC
  Filled 2015-09-06: qty 2

## 2015-09-06 MED ORDER — CEFAZOLIN SODIUM-DEXTROSE 2-4 GM/100ML-% IV SOLN
2.0000 g | INTRAVENOUS | Status: AC
  Administered 2015-09-06: 2 g via INTRAVENOUS

## 2015-09-06 MED ORDER — DEXAMETHASONE SODIUM PHOSPHATE 10 MG/ML IJ SOLN
INTRAMUSCULAR | Status: AC
  Filled 2015-09-06: qty 1

## 2015-09-06 MED ORDER — DEXAMETHASONE SODIUM PHOSPHATE 4 MG/ML IJ SOLN
INTRAMUSCULAR | Status: DC | PRN
  Administered 2015-09-06: 10 mg via INTRAVENOUS

## 2015-09-06 MED ORDER — ONDANSETRON HCL 4 MG/2ML IJ SOLN
INTRAMUSCULAR | Status: AC
  Filled 2015-09-06: qty 2

## 2015-09-06 MED ORDER — LIDOCAINE HCL (PF) 1 % IJ SOLN
INTRAMUSCULAR | Status: AC
  Filled 2015-09-06: qty 30

## 2015-09-06 MED ORDER — LIDOCAINE HCL (CARDIAC) 20 MG/ML IV SOLN
INTRAVENOUS | Status: DC | PRN
  Administered 2015-09-06: 50 mg via INTRAVENOUS

## 2015-09-06 MED ORDER — FENTANYL CITRATE (PF) 100 MCG/2ML IJ SOLN
INTRAMUSCULAR | Status: AC
  Filled 2015-09-06: qty 2

## 2015-09-06 MED ORDER — MIDAZOLAM HCL 2 MG/2ML IJ SOLN
1.0000 mg | INTRAMUSCULAR | Status: DC | PRN
  Administered 2015-09-06: 2 mg via INTRAVENOUS

## 2015-09-06 MED ORDER — CEFAZOLIN SODIUM-DEXTROSE 2-4 GM/100ML-% IV SOLN
INTRAVENOUS | Status: AC
  Filled 2015-09-06: qty 100

## 2015-09-06 MED ORDER — LACTATED RINGERS IV SOLN
INTRAVENOUS | Status: DC
Start: 2015-09-06 — End: 2015-09-06

## 2015-09-06 MED ORDER — BUPIVACAINE-EPINEPHRINE (PF) 0.5% -1:200000 IJ SOLN
INTRAMUSCULAR | Status: AC
  Filled 2015-09-06: qty 30

## 2015-09-06 MED ORDER — GLYCOPYRROLATE 0.2 MG/ML IJ SOLN: 0.2000 mg | Freq: Once | INTRAMUSCULAR | Status: DC | PRN

## 2015-09-06 MED ORDER — BUPIVACAINE-EPINEPHRINE (PF) 0.5% -1:200000 IJ SOLN
INTRAMUSCULAR | Status: DC | PRN
  Administered 2015-09-06: 30 mL via PERINEURAL

## 2015-09-06 MED ORDER — LACTATED RINGERS IV SOLN: INTRAVENOUS | Status: DC

## 2015-09-06 MED ORDER — LACTATED RINGERS IV SOLN
INTRAVENOUS | Status: DC
  Administered 2015-09-06: 07:00:00 via INTRAVENOUS

## 2015-09-06 MED ORDER — PROPOFOL 10 MG/ML IV BOLUS
INTRAVENOUS | Status: DC | PRN
  Administered 2015-09-06: 200 mg via INTRAVENOUS

## 2015-09-06 MED ORDER — FENTANYL CITRATE (PF) 100 MCG/2ML IJ SOLN
50.0000 ug | INTRAMUSCULAR | Status: DC | PRN
  Administered 2015-09-06: 100 ug via INTRAVENOUS
  Administered 2015-09-06: 50 ug via INTRAVENOUS

## 2015-09-06 MED ORDER — FENTANYL CITRATE (PF) 100 MCG/2ML IJ SOLN: 25.0000 ug | INTRAMUSCULAR | Status: DC | PRN

## 2015-09-06 MED ORDER — LIDOCAINE HCL (CARDIAC) 20 MG/ML IV SOLN
INTRAVENOUS | Status: AC
  Filled 2015-09-06: qty 5

## 2015-09-06 MED ORDER — SCOPOLAMINE 1 MG/3DAYS TD PT72: 1.0000 | MEDICATED_PATCH | Freq: Once | TRANSDERMAL | Status: DC | PRN

## 2015-09-06 MED ORDER — PROPOFOL 500 MG/50ML IV EMUL
INTRAVENOUS | Status: AC
  Filled 2015-09-06: qty 50

## 2015-09-06 SURGICAL SUPPLY — 65 items
BIT DRILL 1.1 (BIT) ×2
BIT DRILL 60X20X1.1XQC TMX (BIT) IMPLANT
BIT DRL 60X20X1.1XQC TMX (BIT) ×1
BLADE MINI RND TIP GREEN BEAV (BLADE) IMPLANT
BLADE SURG 15 STRL LF DISP TIS (BLADE) ×1 IMPLANT
BLADE SURG 15 STRL SS (BLADE) ×2
BNDG CMPR 9X4 STRL LF SNTH (GAUZE/BANDAGES/DRESSINGS) ×1
BNDG COHESIVE 4X5 TAN STRL (GAUZE/BANDAGES/DRESSINGS) ×2 IMPLANT
BNDG ESMARK 4X9 LF (GAUZE/BANDAGES/DRESSINGS) ×2 IMPLANT
BNDG GAUZE ELAST 4 BULKY (GAUZE/BANDAGES/DRESSINGS) ×4 IMPLANT
BONE CHIP PRESERV 5CC PCAN5 (Bone Implant) ×2 IMPLANT
CANISTER SUCTION 1200CC (MISCELLANEOUS) ×1 IMPLANT
CHLORAPREP W/TINT 26ML (MISCELLANEOUS) ×2 IMPLANT
CORDS BIPOLAR (ELECTRODE) ×2 IMPLANT
COVER BACK TABLE 60X90IN (DRAPES) ×2 IMPLANT
COVER MAYO STAND STRL (DRAPES) ×2 IMPLANT
CUFF TOURNIQUET SINGLE 18IN (TOURNIQUET CUFF) ×1 IMPLANT
DRAPE C-ARM 42X72 X-RAY (DRAPES) ×2 IMPLANT
DRAPE EXTREMITY T 121X128X90 (DRAPE) ×2 IMPLANT
DRAPE SURG 17X23 STRL (DRAPES) ×2 IMPLANT
DRIVER BIT 1.5 (TRAUMA) ×2 IMPLANT
DRSG EMULSION OIL 3X3 NADH (GAUZE/BANDAGES/DRESSINGS) ×4 IMPLANT
GAUZE SPONGE 4X4 12PLY STRL (GAUZE/BANDAGES/DRESSINGS) ×3 IMPLANT
GAUZE SPONGE 4X4 16PLY XRAY LF (GAUZE/BANDAGES/DRESSINGS) ×1 IMPLANT
GLOVE BIO SURGEON STRL SZ7.5 (GLOVE) ×2 IMPLANT
GLOVE BIOGEL PI IND STRL 7.0 (GLOVE) ×1 IMPLANT
GLOVE BIOGEL PI IND STRL 8 (GLOVE) ×1 IMPLANT
GLOVE BIOGEL PI INDICATOR 7.0 (GLOVE) ×3
GLOVE BIOGEL PI INDICATOR 8 (GLOVE) ×1
GLOVE ECLIPSE 6.5 STRL STRAW (GLOVE) ×4 IMPLANT
GLOVE SURG SS PI 7.0 STRL IVOR (GLOVE) ×2 IMPLANT
GOWN STRL REUS W/ TWL LRG LVL3 (GOWN DISPOSABLE) ×2 IMPLANT
GOWN STRL REUS W/TWL LRG LVL3 (GOWN DISPOSABLE) ×6
GOWN STRL REUS W/TWL XL LVL3 (GOWN DISPOSABLE) ×2 IMPLANT
GRAFT BNE CANC CHIPS 1-8 5CC (Bone Implant) IMPLANT
LOCK SCREW 1.5X15MM (Screw) ×4 IMPLANT
NDL HYPO 25X1 1.5 SAFETY (NEEDLE) IMPLANT
NEEDLE HYPO 25X1 1.5 SAFETY (NEEDLE) IMPLANT
NS IRRIG 1000ML POUR BTL (IV SOLUTION) ×2 IMPLANT
PACK BASIN DAY SURGERY FS (CUSTOM PROCEDURE TRAY) ×2 IMPLANT
PADDING CAST ABS 4INX4YD NS (CAST SUPPLIES)
PADDING CAST ABS COTTON 4X4 ST (CAST SUPPLIES) IMPLANT
PLATE TYL 1.5 (Plate) ×2 IMPLANT
SCREW L 1.5X14 (Screw) ×3 IMPLANT
SCREW LOCK 1.5X15MM (Screw) IMPLANT
SCREW LOCKING 1.5X16 (Screw) ×2 IMPLANT
SCREW NONIOC 1.5 14M (Screw) ×2 IMPLANT
SLEEVE SCD COMPRESS KNEE MED (MISCELLANEOUS) ×2 IMPLANT
SLING ARM FOAM STRAP XLG (SOFTGOODS) ×1 IMPLANT
SPLINT PLASTER CAST XFAST 3X15 (CAST SUPPLIES) ×1 IMPLANT
SPLINT PLASTER XTRA FASTSET 3X (CAST SUPPLIES) ×10
STOCKINETTE 6  STRL (DRAPES) ×1
STOCKINETTE 6 STRL (DRAPES) ×1 IMPLANT
SUCTION FRAZIER HANDLE 10FR (MISCELLANEOUS) ×1
SUCTION TUBE FRAZIER 10FR DISP (MISCELLANEOUS) IMPLANT
SUT VIC AB 4-0 RB1 27 (SUTURE) ×2
SUT VIC AB 4-0 RB1 27X BRD (SUTURE) ×1 IMPLANT
SUT VICRYL RAPIDE 4-0 (SUTURE) IMPLANT
SUT VICRYL RAPIDE 4/0 PS 2 (SUTURE) ×1 IMPLANT
SYR BULB 3OZ (MISCELLANEOUS) ×1 IMPLANT
SYRINGE 10CC LL (SYRINGE) IMPLANT
TOWEL OR 17X24 6PK STRL BLUE (TOWEL DISPOSABLE) ×2 IMPLANT
TOWEL OR NON WOVEN STRL DISP B (DISPOSABLE) ×1 IMPLANT
TUBE CONNECTING 20X1/4 (TUBING) ×1 IMPLANT
UNDERPAD 30X30 (UNDERPADS AND DIAPERS) ×2 IMPLANT
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
## 2015-09-06 NOTE — Interval H&P Note (Signed)
History and Physical Interval Note:  09/06/2015 7:43 AM  Clifford [name]  has presented today for surgery, with the diagnosis of LEFT LONG FINGER METACARPAL FACTURE S62.393B  The various methods of treatment have been discussed with the patient and family. After consideration of risks, benefits and other options for treatment, the patient has consented to  Procedure(s): OPEN TREATMENT OF LEFT THIRD METACARPAL FRACTURE (Left) as a surgical intervention .  The patient's history has been reviewed, patient examined, no change in status, stable for surgery.  I have reviewed the patient's chart and labs.  Questions were answered to the patient's satisfaction.     Danaka Llera A.
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
## 2015-09-06 NOTE — Transfer of Care (Signed)
Immediate Anesthesia Transfer of Care Note  Patient: Clifford [name]  Procedure(s) Performed: Procedure(s): OPEN TREATMENT OF LEFT THIRD METACARPAL FRACTURE (Left)  Patient Location: PACU  Anesthesia Type:General  Level of Consciousness: awake and sedated  Airway & Oxygen Therapy: Patient Spontanous Breathing and Patient connected to face mask oxygen  Post-op Assessment: Report given to RN and Post -op Vital signs reviewed and stable  Post vital signs: Reviewed and stable  Last Vitals:  Filed Vitals:   09/06/15 0715 09/06/15 0928  BP: 114/70   Pulse: 85 87  Temp:    Resp:      Complications: No apparent anesthesia complications

## 2015-09-06 NOTE — Discharge Instructions (Signed)
Discharge Instructions   You have a dressing with a plaster splint incorporated in it. Move your fingers as much as possible, making a full fist and fully opening the fist. Elevate your hand to reduce pain & swelling of the digits.  Ice over the operative site may be helpful to reduce pain & swelling.  DO NOT USE HEAT. Pain medicine has been prescribed for you.  Use your medicine as needed over the first 48 hours, and then you can begin to taper your use.  You may use Tylenol in place of your prescribed pain medication, but not IN ADDITION to it. Leave the dressing in place until you return to our office.  You may shower, but keep the bandage clean & dry.  You may drive a car when you are off of prescription pain medications and can safely control your vehicle with both hands. Our office will call you to arrange follow-up   Please call [phone] during normal business hours or [phone] after hours for any problems. Including the following:  - excessive redness of the incisions - drainage for more than 4 days - fever of more than 101.5 F  *Please note that pain medications will not be refilled after hours or on weekends.   Regional Anesthesia Blocks  1. Numbness or the inability to move the "blocked" extremity may last from 3-48 hours after placement. The length of time depends on the medication injected and your individual response to the medication. If the numbness is not going away after 48 hours, call your surgeon.  2. The extremity that is blocked will need to be protected until the numbness is gone and the  Strength has returned. Because you cannot feel it, you will need to take extra care to avoid injury. Because it may be weak, you may have difficulty moving it or using it. You may not know what position it is in without looking at it while the block is in effect.  3. For blocks in the legs and feet, returning to weight bearing and walking needs to be done carefully. You  will need to wait until the numbness is entirely gone and the strength has returned. You should be able to move your leg and foot normally before you try and bear weight or walk. You will need someone to be with you when you first try to ensure you do not fall and possibly risk injury.  4. Bruising and tenderness at the needle site are common side effects and will resolve in a few days.  5. Persistent numbness or new problems with movement should be communicated to the surgeon or the [place] [phone] [place] [phone]).  Post Anesthesia Home Care Instructions  Activity: Get plenty of rest for the remainder of the day. A responsible adult should stay with you for 24 hours following the procedure.  For the next 24 hours, DO NOT: -Drive a car -Paediatric nurse -Drink alcoholic beverages -Take any medication unless instructed by your physician -Make any legal decisions or sign important papers.  Meals: Start with liquid foods such as gelatin or soup. Progress to regular foods as tolerated. Avoid greasy, spicy, heavy foods. If nausea and/or vomiting occur, drink only clear liquids until the nausea and/or vomiting subsides. Call your physician if vomiting continues.  Special Instructions/Symptoms: Your throat may feel dry or sore from the anesthesia or the breathing tube placed in your throat during surgery. If this causes discomfort, gargle with warm salt water.  The discomfort should disappear within 24 hours.  If you had a scopolamine patch placed behind your ear for the management of post- operative nausea and/or vomiting:  1. The medication in the patch is effective for 72 hours, after which it should be removed.  Wrap patch in a tissue and discard in the trash. Wash hands thoroughly with soap and water. 2. You may remove the patch earlier than 72 hours if you experience unpleasant side effects which may include dry mouth, dizziness or visual  disturbances. 3. Avoid touching the patch. Wash your hands with soap and water after contact with the patch.

## 2015-09-06 NOTE — Progress Notes (Signed)
Assisted Dr Landry Dyke  with left, ultrasound guided, interscalene  block. Side rails up, monitors on throughout procedure. See vital signs in flow sheet. Tolerated Procedure well.

## 2015-09-06 NOTE — Anesthesia Postprocedure Evaluation (Signed)
Anesthesia Post Note  Patient: Clifford [name]  Procedure(s) Performed: Procedure(s) (LRB): OPEN TREATMENT OF LEFT THIRD METACARPAL FRACTURE (Left)  Patient location during evaluation: PACU Anesthesia Type: General Level of consciousness: awake and alert Pain management: pain level controlled Vital Signs Assessment: post-procedure vital signs reviewed and stable Respiratory status: spontaneous breathing, nonlabored ventilation, respiratory function stable and patient connected to nasal cannula oxygen Cardiovascular status: blood pressure returned to baseline and stable Postop Assessment: no signs of nausea or vomiting Anesthetic complications: no    Last Vitals:  Filed Vitals:   09/06/15 1008 09/06/15 1031  BP:  139/86  Pulse: 84 84  Temp:  36.7 C  Resp: 19 20    Last Pain:  Filed Vitals:   09/06/15 1032  PainSc: 0-No pain                 Montarius Kitagawa L

## 2015-09-09 ENCOUNTER — Encounter (HOSPITAL_BASED_OUTPATIENT_CLINIC_OR_DEPARTMENT_OTHER): Payer: Self-pay | Admitting: Orthopedic Surgery

## 2015-09-11 DIAGNOSIS — S62393D Other fracture of third metacarpal bone, left hand, subsequent encounter for fracture with routine healing: Secondary | ICD-10-CM | POA: Diagnosis not present

## 2015-09-11 DIAGNOSIS — S62328D Displaced fracture of shaft of other metacarpal bone, subsequent encounter for fracture with routine healing: Secondary | ICD-10-CM | POA: Diagnosis not present

## 2015-09-19 DIAGNOSIS — S62328D Displaced fracture of shaft of other metacarpal bone, subsequent encounter for fracture with routine healing: Secondary | ICD-10-CM | POA: Diagnosis not present

## 2015-09-24 DIAGNOSIS — S62328D Displaced fracture of shaft of other metacarpal bone, subsequent encounter for fracture with routine healing: Secondary | ICD-10-CM | POA: Diagnosis not present

## 2015-09-27 DIAGNOSIS — S62328D Displaced fracture of shaft of other metacarpal bone, subsequent encounter for fracture with routine healing: Secondary | ICD-10-CM | POA: Diagnosis not present

## 2015-10-01 DIAGNOSIS — S62328D Displaced fracture of shaft of other metacarpal bone, subsequent encounter for fracture with routine healing: Secondary | ICD-10-CM | POA: Diagnosis not present

## 2015-10-02 DIAGNOSIS — S62393D Other fracture of third metacarpal bone, left hand, subsequent encounter for fracture with routine healing: Secondary | ICD-10-CM | POA: Diagnosis not present

## 2015-10-03 DIAGNOSIS — S62328D Displaced fracture of shaft of other metacarpal bone, subsequent encounter for fracture with routine healing: Secondary | ICD-10-CM | POA: Diagnosis not present

## 2015-10-08 DIAGNOSIS — S62328D Displaced fracture of shaft of other metacarpal bone, subsequent encounter for fracture with routine healing: Secondary | ICD-10-CM | POA: Diagnosis not present

## 2015-10-10 DIAGNOSIS — S62328D Displaced fracture of shaft of other metacarpal bone, subsequent encounter for fracture with routine healing: Secondary | ICD-10-CM | POA: Diagnosis not present

## 2015-10-15 DIAGNOSIS — S62328D Displaced fracture of shaft of other metacarpal bone, subsequent encounter for fracture with routine healing: Secondary | ICD-10-CM | POA: Diagnosis not present

## 2015-10-18 DIAGNOSIS — S62328D Displaced fracture of shaft of other metacarpal bone, subsequent encounter for fracture with routine healing: Secondary | ICD-10-CM | POA: Diagnosis not present

## 2015-10-22 DIAGNOSIS — S62328D Displaced fracture of shaft of other metacarpal bone, subsequent encounter for fracture with routine healing: Secondary | ICD-10-CM | POA: Diagnosis not present

## 2015-10-24 ENCOUNTER — Other Ambulatory Visit (HOSPITAL_BASED_OUTPATIENT_CLINIC_OR_DEPARTMENT_OTHER): Payer: Medicare HMO

## 2015-10-24 ENCOUNTER — Ambulatory Visit (HOSPITAL_BASED_OUTPATIENT_CLINIC_OR_DEPARTMENT_OTHER): Payer: Medicare HMO | Admitting: Oncology

## 2015-10-24 ENCOUNTER — Telehealth: Payer: Self-pay | Admitting: Oncology

## 2015-10-24 VITALS — BP 140/72 | HR 75 | Temp 98.2°F | Resp 20 | Ht 72.0 in | Wt 210.3 lb

## 2015-10-24 DIAGNOSIS — Z8572 Personal history of non-Hodgkin lymphomas: Secondary | ICD-10-CM

## 2015-10-24 DIAGNOSIS — S62328D Displaced fracture of shaft of other metacarpal bone, subsequent encounter for fracture with routine healing: Secondary | ICD-10-CM | POA: Diagnosis not present

## 2015-10-24 DIAGNOSIS — C8212 Follicular lymphoma grade II, intrathoracic lymph nodes: Secondary | ICD-10-CM

## 2015-10-24 DIAGNOSIS — I10 Essential (primary) hypertension: Secondary | ICD-10-CM

## 2015-10-24 DIAGNOSIS — C8590 Non-Hodgkin lymphoma, unspecified, unspecified site: Secondary | ICD-10-CM

## 2015-10-24 LAB — CBC WITH DIFFERENTIAL/PLATELET
BASO%: 0.5 % (ref 0.0–2.0)
Basophils Absolute: 0 10*3/uL (ref 0.0–0.1)
EOS%: 3.5 % (ref 0.0–7.0)
Eosinophils Absolute: 0.2 10*3/uL (ref 0.0–0.5)
HCT: 44.7 % (ref 38.4–49.9)
HGB: 16.2 g/dL (ref 13.0–17.1)
LYMPH%: 26.2 % (ref 14.0–49.0)
MCH: 32 pg (ref 27.2–33.4)
MCHC: 36.2 g/dL — ABNORMAL HIGH (ref 32.0–36.0)
MCV: 88.2 fL (ref 79.3–98.0)
MONO#: 0.3 10*3/uL (ref 0.1–0.9)
MONO%: 6.5 % (ref 0.0–14.0)
NEUT#: 2.7 10*3/uL (ref 1.5–6.5)
NEUT%: 63.3 % (ref 39.0–75.0)
Platelets: 178 10*3/uL (ref 140–400)
RBC: 5.07 10*6/uL (ref 4.20–5.82)
RDW: 14.2 % (ref 11.0–14.6)
WBC: 4.3 10*3/uL (ref 4.0–10.3)
lymph#: 1.1 10*3/uL (ref 0.9–3.3)
nRBC: 0 % (ref 0–0)

## 2015-10-24 LAB — COMPREHENSIVE METABOLIC PANEL
ALT: 29 U/L (ref 0–55)
AST: 20 U/L (ref 5–34)
Albumin: 3.7 g/dL (ref 3.5–5.0)
Alkaline Phosphatase: 45 U/L (ref 40–150)
Anion Gap: 8 mEq/L (ref 3–11)
BUN: 13.6 mg/dL (ref 7.0–26.0)
CO2: 28 mEq/L (ref 22–29)
Calcium: 9.3 mg/dL (ref 8.4–10.4)
Chloride: 105 mEq/L (ref 98–109)
Creatinine: 1 mg/dL (ref 0.7–1.3)
EGFR: 81 mL/min/{1.73_m2} — ABNORMAL LOW (ref >90)
Glucose: 110 mg/dl (ref 70–140)
Potassium: 3.5 mEq/L (ref 3.5–5.1)
Sodium: 142 mEq/L (ref 136–145)
Total Bilirubin: 0.81 mg/dL (ref 0.20–1.20)
Total Protein: 6.6 g/dL (ref 6.4–8.3)

## 2015-10-24 NOTE — Telephone Encounter (Signed)
Gave pt avs °
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
## 2015-10-29 DIAGNOSIS — S62328D Displaced fracture of shaft of other metacarpal bone, subsequent encounter for fracture with routine healing: Secondary | ICD-10-CM | POA: Diagnosis not present

## 2015-10-31 DIAGNOSIS — S62328D Displaced fracture of shaft of other metacarpal bone, subsequent encounter for fracture with routine healing: Secondary | ICD-10-CM | POA: Diagnosis not present

## 2015-11-05 DIAGNOSIS — S62328D Displaced fracture of shaft of other metacarpal bone, subsequent encounter for fracture with routine healing: Secondary | ICD-10-CM | POA: Diagnosis not present

## 2015-11-07 DIAGNOSIS — S62328D Displaced fracture of shaft of other metacarpal bone, subsequent encounter for fracture with routine healing: Secondary | ICD-10-CM | POA: Diagnosis not present

## 2015-11-12 DIAGNOSIS — S62328D Displaced fracture of shaft of other metacarpal bone, subsequent encounter for fracture with routine healing: Secondary | ICD-10-CM | POA: Diagnosis not present

## 2015-11-15 DIAGNOSIS — S62328D Displaced fracture of shaft of other metacarpal bone, subsequent encounter for fracture with routine healing: Secondary | ICD-10-CM | POA: Diagnosis not present

## 2015-11-19 DIAGNOSIS — S62328D Displaced fracture of shaft of other metacarpal bone, subsequent encounter for fracture with routine healing: Secondary | ICD-10-CM | POA: Diagnosis not present

## 2015-11-21 DIAGNOSIS — S62328D Displaced fracture of shaft of other metacarpal bone, subsequent encounter for fracture with routine healing: Secondary | ICD-10-CM | POA: Diagnosis not present

## 2015-11-26 DIAGNOSIS — S62328D Displaced fracture of shaft of other metacarpal bone, subsequent encounter for fracture with routine healing: Secondary | ICD-10-CM | POA: Diagnosis not present

## 2015-11-28 DIAGNOSIS — S62328D Displaced fracture of shaft of other metacarpal bone, subsequent encounter for fracture with routine healing: Secondary | ICD-10-CM | POA: Diagnosis not present

## 2015-12-03 DIAGNOSIS — S62328D Displaced fracture of shaft of other metacarpal bone, subsequent encounter for fracture with routine healing: Secondary | ICD-10-CM | POA: Diagnosis not present

## 2015-12-05 DIAGNOSIS — S62328D Displaced fracture of shaft of other metacarpal bone, subsequent encounter for fracture with routine healing: Secondary | ICD-10-CM | POA: Diagnosis not present

## 2015-12-09 DIAGNOSIS — S62328D Displaced fracture of shaft of other metacarpal bone, subsequent encounter for fracture with routine healing: Secondary | ICD-10-CM | POA: Diagnosis not present

## 2015-12-12 DIAGNOSIS — S62328D Displaced fracture of shaft of other metacarpal bone, subsequent encounter for fracture with routine healing: Secondary | ICD-10-CM | POA: Diagnosis not present

## 2015-12-17 DIAGNOSIS — S62328D Displaced fracture of shaft of other metacarpal bone, subsequent encounter for fracture with routine healing: Secondary | ICD-10-CM | POA: Diagnosis not present

## 2015-12-19 DIAGNOSIS — S62328D Displaced fracture of shaft of other metacarpal bone, subsequent encounter for fracture with routine healing: Secondary | ICD-10-CM | POA: Diagnosis not present

## 2015-12-24 DIAGNOSIS — S62328D Displaced fracture of shaft of other metacarpal bone, subsequent encounter for fracture with routine healing: Secondary | ICD-10-CM | POA: Diagnosis not present

## 2015-12-30 DIAGNOSIS — S62328D Displaced fracture of shaft of other metacarpal bone, subsequent encounter for fracture with routine healing: Secondary | ICD-10-CM | POA: Diagnosis not present

## 2016-01-02 DIAGNOSIS — S62328D Displaced fracture of shaft of other metacarpal bone, subsequent encounter for fracture with routine healing: Secondary | ICD-10-CM | POA: Diagnosis not present

## 2016-01-06 DIAGNOSIS — S62328D Displaced fracture of shaft of other metacarpal bone, subsequent encounter for fracture with routine healing: Secondary | ICD-10-CM | POA: Diagnosis not present

## 2016-01-08 DIAGNOSIS — S62328D Displaced fracture of shaft of other metacarpal bone, subsequent encounter for fracture with routine healing: Secondary | ICD-10-CM | POA: Diagnosis not present

## 2016-01-14 DIAGNOSIS — S62328D Displaced fracture of shaft of other metacarpal bone, subsequent encounter for fracture with routine healing: Secondary | ICD-10-CM | POA: Diagnosis not present

## 2016-01-16 DIAGNOSIS — S62393D Other fracture of third metacarpal bone, left hand, subsequent encounter for fracture with routine healing: Secondary | ICD-10-CM | POA: Diagnosis not present

## 2016-01-20 DIAGNOSIS — S62328D Displaced fracture of shaft of other metacarpal bone, subsequent encounter for fracture with routine healing: Secondary | ICD-10-CM | POA: Diagnosis not present

## 2016-01-28 DIAGNOSIS — S62328D Displaced fracture of shaft of other metacarpal bone, subsequent encounter for fracture with routine healing: Secondary | ICD-10-CM | POA: Diagnosis not present

## 2016-02-04 DIAGNOSIS — S62328D Displaced fracture of shaft of other metacarpal bone, subsequent encounter for fracture with routine healing: Secondary | ICD-10-CM | POA: Diagnosis not present

## 2016-02-15 DIAGNOSIS — J029 Acute pharyngitis, unspecified: Secondary | ICD-10-CM | POA: Diagnosis not present

## 2016-02-25 DIAGNOSIS — S62328D Displaced fracture of shaft of other metacarpal bone, subsequent encounter for fracture with routine healing: Secondary | ICD-10-CM | POA: Diagnosis not present

## 2016-03-10 DIAGNOSIS — R69 Illness, unspecified: Secondary | ICD-10-CM | POA: Diagnosis not present

## 2016-03-16 DIAGNOSIS — S62328D Displaced fracture of shaft of other metacarpal bone, subsequent encounter for fracture with routine healing: Secondary | ICD-10-CM | POA: Diagnosis not present

## 2016-03-17 DIAGNOSIS — S62393D Other fracture of third metacarpal bone, left hand, subsequent encounter for fracture with routine healing: Secondary | ICD-10-CM | POA: Diagnosis not present

## 2016-04-02 DIAGNOSIS — E785 Hyperlipidemia, unspecified: Secondary | ICD-10-CM | POA: Diagnosis not present

## 2016-04-02 DIAGNOSIS — I10 Essential (primary) hypertension: Secondary | ICD-10-CM | POA: Diagnosis not present

## 2016-04-02 DIAGNOSIS — Z1211 Encounter for screening for malignant neoplasm of colon: Secondary | ICD-10-CM | POA: Diagnosis not present

## 2016-04-02 DIAGNOSIS — Z8572 Personal history of non-Hodgkin lymphomas: Secondary | ICD-10-CM | POA: Diagnosis not present

## 2016-04-02 DIAGNOSIS — R7309 Other abnormal glucose: Secondary | ICD-10-CM | POA: Diagnosis not present

## 2016-04-02 DIAGNOSIS — Z125 Encounter for screening for malignant neoplasm of prostate: Secondary | ICD-10-CM | POA: Diagnosis not present

## 2016-04-02 DIAGNOSIS — Z0000 Encounter for general adult medical examination without abnormal findings: Secondary | ICD-10-CM | POA: Diagnosis not present

## 2016-04-22 DIAGNOSIS — D582 Other hemoglobinopathies: Secondary | ICD-10-CM | POA: Diagnosis not present

## 2016-05-27 DIAGNOSIS — D1801 Hemangioma of skin and subcutaneous tissue: Secondary | ICD-10-CM | POA: Diagnosis not present

## 2016-05-27 DIAGNOSIS — B359 Dermatophytosis, unspecified: Secondary | ICD-10-CM | POA: Diagnosis not present

## 2016-05-27 DIAGNOSIS — C44712 Basal cell carcinoma of skin of right lower limb, including hip: Secondary | ICD-10-CM | POA: Diagnosis not present

## 2016-05-27 DIAGNOSIS — D225 Melanocytic nevi of trunk: Secondary | ICD-10-CM | POA: Diagnosis not present

## 2016-05-27 DIAGNOSIS — D485 Neoplasm of uncertain behavior of skin: Secondary | ICD-10-CM | POA: Diagnosis not present

## 2016-05-27 DIAGNOSIS — L814 Other melanin hyperpigmentation: Secondary | ICD-10-CM | POA: Diagnosis not present

## 2016-05-27 DIAGNOSIS — L821 Other seborrheic keratosis: Secondary | ICD-10-CM | POA: Diagnosis not present

## 2016-06-03 DIAGNOSIS — I10 Essential (primary) hypertension: Secondary | ICD-10-CM | POA: Diagnosis not present

## 2016-06-03 DIAGNOSIS — Z6831 Body mass index (BMI) 31.0-31.9, adult: Secondary | ICD-10-CM | POA: Diagnosis not present

## 2016-06-03 DIAGNOSIS — Z0000 Encounter for general adult medical examination without abnormal findings: Secondary | ICD-10-CM | POA: Diagnosis not present

## 2016-07-09 DIAGNOSIS — Z86010 Personal history of colonic polyps: Secondary | ICD-10-CM | POA: Diagnosis not present

## 2016-07-09 DIAGNOSIS — K621 Rectal polyp: Secondary | ICD-10-CM | POA: Diagnosis not present

## 2016-07-09 DIAGNOSIS — D126 Benign neoplasm of colon, unspecified: Secondary | ICD-10-CM | POA: Diagnosis not present

## 2016-07-09 DIAGNOSIS — K640 First degree hemorrhoids: Secondary | ICD-10-CM | POA: Diagnosis not present

## 2016-07-09 DIAGNOSIS — K5730 Diverticulosis of large intestine without perforation or abscess without bleeding: Secondary | ICD-10-CM | POA: Diagnosis not present

## 2016-07-14 DIAGNOSIS — K621 Rectal polyp: Secondary | ICD-10-CM | POA: Diagnosis not present

## 2016-07-14 DIAGNOSIS — D126 Benign neoplasm of colon, unspecified: Secondary | ICD-10-CM | POA: Diagnosis not present

## 2016-07-28 DIAGNOSIS — S62393D Other fracture of third metacarpal bone, left hand, subsequent encounter for fracture with routine healing: Secondary | ICD-10-CM | POA: Diagnosis not present

## 2016-08-10 DIAGNOSIS — S62393D Other fracture of third metacarpal bone, left hand, subsequent encounter for fracture with routine healing: Secondary | ICD-10-CM | POA: Diagnosis not present

## 2016-08-13 DIAGNOSIS — R05 Cough: Secondary | ICD-10-CM | POA: Diagnosis not present

## 2016-08-13 DIAGNOSIS — J209 Acute bronchitis, unspecified: Secondary | ICD-10-CM | POA: Diagnosis not present

## 2016-10-21 ENCOUNTER — Ambulatory Visit (HOSPITAL_BASED_OUTPATIENT_CLINIC_OR_DEPARTMENT_OTHER): Payer: Medicare HMO | Admitting: Oncology

## 2016-10-21 ENCOUNTER — Other Ambulatory Visit (HOSPITAL_BASED_OUTPATIENT_CLINIC_OR_DEPARTMENT_OTHER): Payer: Medicare HMO

## 2016-10-21 ENCOUNTER — Telehealth: Payer: Self-pay | Admitting: Oncology

## 2016-10-21 VITALS — BP 149/85 | HR 88 | Temp 98.3°F | Resp 18 | Ht 72.0 in | Wt 221.2 lb

## 2016-10-21 DIAGNOSIS — Z8572 Personal history of non-Hodgkin lymphomas: Secondary | ICD-10-CM

## 2016-10-21 DIAGNOSIS — C8212 Follicular lymphoma grade II, intrathoracic lymph nodes: Secondary | ICD-10-CM

## 2016-10-21 DIAGNOSIS — I10 Essential (primary) hypertension: Secondary | ICD-10-CM

## 2016-10-21 DIAGNOSIS — C8590 Non-Hodgkin lymphoma, unspecified, unspecified site: Secondary | ICD-10-CM

## 2016-10-21 LAB — COMPREHENSIVE METABOLIC PANEL
ALT: 37 U/L (ref 0–55)
AST: 27 U/L (ref 5–34)
Albumin: 3.9 g/dL (ref 3.5–5.0)
Alkaline Phosphatase: 53 U/L (ref 40–150)
Anion Gap: 10 mEq/L (ref 3–11)
BUN: 15.3 mg/dL (ref 7.0–26.0)
CO2: 29 mEq/L (ref 22–29)
Calcium: 9.3 mg/dL (ref 8.4–10.4)
Chloride: 105 mEq/L (ref 98–109)
Creatinine: 1 mg/dL (ref 0.7–1.3)
EGFR: 73 mL/min/{1.73_m2} — ABNORMAL LOW (ref >90)
Glucose: 104 mg/dl (ref 70–140)
Potassium: 4 mEq/L (ref 3.5–5.1)
Sodium: 144 mEq/L (ref 136–145)
Total Bilirubin: 1.09 mg/dL (ref 0.20–1.20)
Total Protein: 6.8 g/dL (ref 6.4–8.3)

## 2016-10-21 LAB — CBC WITH DIFFERENTIAL/PLATELET
BASO%: 0.2 % (ref 0.0–2.0)
Basophils Absolute: 0 10*3/uL (ref 0.0–0.1)
EOS%: 1.5 % (ref 0.0–7.0)
Eosinophils Absolute: 0.1 10*3/uL (ref 0.0–0.5)
HCT: 45.5 % (ref 38.4–49.9)
HGB: 16.4 g/dL (ref 13.0–17.1)
LYMPH%: 26.9 % (ref 14.0–49.0)
MCH: 32.9 pg (ref 27.2–33.4)
MCHC: 36 g/dL (ref 32.0–36.0)
MCV: 91.2 fL (ref 79.3–98.0)
MONO#: 0.4 10*3/uL (ref 0.1–0.9)
MONO%: 7.8 % (ref 0.0–14.0)
NEUT#: 3 10*3/uL (ref 1.5–6.5)
NEUT%: 63.6 % (ref 39.0–75.0)
Platelets: 167 10*3/uL (ref 140–400)
RBC: 4.99 10*6/uL (ref 4.20–5.82)
RDW: 14.2 % (ref 11.0–14.6)
WBC: 4.7 10*3/uL (ref 4.0–10.3)
lymph#: 1.3 10*3/uL (ref 0.9–3.3)

## 2016-10-21 NOTE — Telephone Encounter (Signed)
Gave patient AVS and calender per 5/16 los. - lab and f/u in one year.
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
## 2017-04-08 DIAGNOSIS — Z8572 Personal history of non-Hodgkin lymphomas: Secondary | ICD-10-CM | POA: Diagnosis not present

## 2017-04-08 DIAGNOSIS — Z125 Encounter for screening for malignant neoplasm of prostate: Secondary | ICD-10-CM | POA: Diagnosis not present

## 2017-04-08 DIAGNOSIS — Z23 Encounter for immunization: Secondary | ICD-10-CM | POA: Diagnosis not present

## 2017-04-08 DIAGNOSIS — R7309 Other abnormal glucose: Secondary | ICD-10-CM | POA: Diagnosis not present

## 2017-04-08 DIAGNOSIS — R319 Hematuria, unspecified: Secondary | ICD-10-CM | POA: Diagnosis not present

## 2017-04-08 DIAGNOSIS — I10 Essential (primary) hypertension: Secondary | ICD-10-CM | POA: Diagnosis not present

## 2017-04-08 DIAGNOSIS — E785 Hyperlipidemia, unspecified: Secondary | ICD-10-CM | POA: Diagnosis not present

## 2017-04-08 DIAGNOSIS — Z0000 Encounter for general adult medical examination without abnormal findings: Secondary | ICD-10-CM | POA: Diagnosis not present

## 2017-04-08 DIAGNOSIS — M79642 Pain in left hand: Secondary | ICD-10-CM | POA: Diagnosis not present

## 2017-05-05 DIAGNOSIS — D582 Other hemoglobinopathies: Secondary | ICD-10-CM | POA: Diagnosis not present

## 2017-10-20 ENCOUNTER — Inpatient Hospital Stay: Payer: Medicare HMO

## 2017-10-20 ENCOUNTER — Telehealth: Payer: Self-pay

## 2017-10-20 ENCOUNTER — Inpatient Hospital Stay: Payer: Medicare HMO | Attending: Oncology | Admitting: Oncology

## 2017-10-20 VITALS — BP 149/82 | HR 77 | Temp 98.3°F | Resp 17 | Ht 72.0 in | Wt 228.6 lb

## 2017-10-20 DIAGNOSIS — Z9221 Personal history of antineoplastic chemotherapy: Secondary | ICD-10-CM | POA: Diagnosis not present

## 2017-10-20 DIAGNOSIS — Z8572 Personal history of non-Hodgkin lymphomas: Secondary | ICD-10-CM | POA: Diagnosis not present

## 2017-10-20 DIAGNOSIS — C8590 Non-Hodgkin lymphoma, unspecified, unspecified site: Secondary | ICD-10-CM

## 2017-10-20 DIAGNOSIS — R319 Hematuria, unspecified: Secondary | ICD-10-CM | POA: Diagnosis not present

## 2017-10-20 DIAGNOSIS — I10 Essential (primary) hypertension: Secondary | ICD-10-CM

## 2017-10-20 LAB — CBC WITH DIFFERENTIAL/PLATELET
Basophils Absolute: 0 10*3/uL (ref 0.0–0.1)
Basophils Relative: 0 %
Eosinophils Absolute: 0.1 10*3/uL (ref 0.0–0.5)
Eosinophils Relative: 2 %
HCT: 57.8 % — ABNORMAL HIGH (ref 38.4–49.9)
Hemoglobin: 17 g/dL (ref 13.0–17.1)
Lymphocytes Relative: 25 %
Lymphs Abs: 1.1 10*3/uL (ref 0.9–3.3)
MCH: 32.1 pg (ref 27.2–33.4)
MCHC: 29.4 g/dL — ABNORMAL LOW (ref 32.0–36.0)
MCV: 109.3 fL — ABNORMAL HIGH (ref 79.3–98.0)
Monocytes Absolute: 0.3 10*3/uL (ref 0.1–0.9)
Monocytes Relative: 6 %
Neutro Abs: 3 10*3/uL (ref 1.5–6.5)
Neutrophils Relative %: 67 %
Platelets: 149 10*3/uL (ref 140–400)
RBC: 5.29 MIL/uL (ref 4.20–5.82)
RDW: 23.9 % — ABNORMAL HIGH (ref 11.0–14.6)
WBC: 4.5 10*3/uL (ref 4.0–10.3)

## 2017-10-20 LAB — COMPREHENSIVE METABOLIC PANEL
ALT: 40 U/L (ref 0–55)
AST: 24 U/L (ref 5–34)
Albumin: 4.1 g/dL (ref 3.5–5.0)
Alkaline Phosphatase: 55 U/L (ref 40–150)
Anion gap: 9 (ref 3–11)
BUN: 19 mg/dL (ref 7–26)
CO2: 30 mmol/L — ABNORMAL HIGH (ref 22–29)
Calcium: 9.5 mg/dL (ref 8.4–10.4)
Chloride: 103 mmol/L (ref 98–109)
Creatinine, Ser: 1.17 mg/dL (ref 0.70–1.30)
GFR calc Af Amer: >60 mL/min (ref >60)
GFR calc non Af Amer: >60 mL/min (ref >60)
Glucose, Bld: 120 mg/dL (ref 70–140)
Potassium: 3.8 mmol/L (ref 3.5–5.1)
Sodium: 142 mmol/L (ref 136–145)
Total Bilirubin: 1 mg/dL (ref 0.2–1.2)
Total Protein: 7 g/dL (ref 6.4–8.3)

## 2017-10-20 NOTE — Progress Notes (Signed)
Hematology and Oncology Follow Up Visit  Clifford [name] [id number] 08/13/1945 72 y.o. 10/20/2017 8:27 AM   Principle Diagnosis:  72 year old man with marginal zone non-Hodgkin's lymphoma diagnosed in 2007. He presented with pleural effusion, pleural-based nodules.    Prior Therapy: 1. Status post pleurodesis and biopsy of the pleural-based nodule. 2. Received 6 cycles of CVP with rituximab. Therapy concluded in December 2007 after achieving remission at the time.  Current therapy: Active surveillance.  Interim History:  Clifford [name] presented today for a follow-up visit.  Since last visit, he continues to be in excellent health and shape without any complaints.  He remains active and continues to work full-time.  In his off days he plays golf regularly.  He denies any respiratory symptoms including cough or wheezing.  He denies any abdominal pain or discomfort.  He does report very rare episodes of hematuria related to kidney stones.   He does not report any headaches or blurry vision or syncope.  He denies any fevers, chills or sweats.  He does not report any chest pain, palpitation.  He does not report any leg edema. He does not report any wheezing or dyspnea on exertion. He does not report any nausea, vomiting, abdominal pain as well as satiety. He does not report any frequency urgency. He does not report any skeletal complaints.  He denies any skin rashes or lesions.  He denies any lymphadenopathy, petechiae or easy bruising.  Remaining review of systems is negative.  Medications: I have reviewed the patient's current medications.  Current Outpatient Medications  Medication Sig Dispense Refill  . amLODipine (NORVASC) 5 MG tablet Take 5 mg by mouth daily.     Marland Kitchen aspirin 81 MG tablet Take 81 mg by mouth daily.      . cetirizine (ZYRTEC) 10 MG tablet Take 10 mg by mouth daily as needed for allergies.    . hydrochlorothiazide (HYDRODIURIL) 25 MG tablet Take 25 mg by mouth daily.    Marland Kitchen KLOR-CON  M20 20 MEQ tablet Take 20 mEq by mouth daily.  1  . oxyCODONE-acetaminophen (PERCOCET/ROXICET) 5-325 MG tablet Take 1 tablet by mouth every 4 (four) hours as needed for severe pain. 15 tablet 0   No current facility-administered medications for this visit.     Allergies: No Known Allergies  Past Medical History, Surgical history, Social history, and Family History were reviewed and updated.   Physical Exam: Blood pressure (!) 149/82, pulse 77, temperature 98.3 F (36.8 C), temperature source Oral, resp. rate 17, height 6' (1.829 m), weight 228 lb 9.6 oz (103.7 kg), SpO2 97 %.   ECOG: 0 General appearance: Alert, awake gentleman without distress. Head: Traumatic without abnormalities. Oropharynx: Without any thrush or ulcers. Eyes: No scleral icterus. Heart: Regular rate without any murmurs or gallops. Lung:chest to auscultation without any rhonchi, wheezes or dullness to percussion. Abdomin: Soft, nontender without any rebound or guarding.  Good bowel sounds. Musculoskeletal: No joint deformity or effusion. Lymphatic examination: No cervical, axillary or supraclavicular adenopathy. Skin: No rashes or lesions.  Lab Results: Lab Results  Component Value Date   WBC 4.5 10/20/2017   HGB 17.0 10/20/2017   HCT 57.8 (H) 10/20/2017   MCV 109.3 (H) 10/20/2017   PLT 149 10/20/2017     Chemistry      Component Value Date/Time   NA 144 10/21/2016 0808   K 4.0 10/21/2016 0808   CL 98 (L) 09/05/2015 1515   CL 109 (H) 10/26/2012 0841   CO2 29 10/21/2016  9355   BUN 15.3 10/21/2016 0808   CREATININE 1.0 10/21/2016 0808      Component Value Date/Time   CALCIUM 9.3 10/21/2016 0808   ALKPHOS 53 10/21/2016 0808   AST 27 10/21/2016 0808   ALT 37 10/21/2016 0808   BILITOT 1.09 10/21/2016 0808      Impression and Plan:  71 year old man with:  1. Non-Hodgkin's lymphoma; marginal zone subtype diagnosed in 2007.  He presented with pulmonary involvement.  He received a systemic  chemotherapy at the time and has been in remission since then.  His physical examination and laboratory data were reviewed and showed no evidence of recurrent disease.  The plan is to continue with active surveillance at this time.  We will repeat physical examination laboratory testing in 1 year.  Imaging study was will be repeated as needed. 2. Hematuria: He follows with urology regarding this issue with history of kidney stones. 3. Hypertension: He is following with his PCP blood pressure appears to be under control. 4. Followup will be in 12 months.  15  minutes was spent with the patient face-to-face today.  More than 50% of time was dedicated to patient counseling, education and discussing future plan of care.    Zola Button, MD 5/15/20198:27 AM

## 2017-10-20 NOTE — Telephone Encounter (Signed)
PRINTED AVS AND CALENDER OF UPCOMING APPOINTMENT PER 5/15 LOS

## 2017-10-25 DIAGNOSIS — J209 Acute bronchitis, unspecified: Secondary | ICD-10-CM | POA: Diagnosis not present

## 2017-10-25 DIAGNOSIS — J069 Acute upper respiratory infection, unspecified: Secondary | ICD-10-CM | POA: Diagnosis not present

## 2018-03-17 DIAGNOSIS — Z23 Encounter for immunization: Secondary | ICD-10-CM | POA: Diagnosis not present

## 2018-04-28 DIAGNOSIS — M79642 Pain in left hand: Secondary | ICD-10-CM | POA: Diagnosis not present

## 2018-04-28 DIAGNOSIS — R7309 Other abnormal glucose: Secondary | ICD-10-CM | POA: Diagnosis not present

## 2018-04-28 DIAGNOSIS — I10 Essential (primary) hypertension: Secondary | ICD-10-CM | POA: Diagnosis not present

## 2018-04-28 DIAGNOSIS — E785 Hyperlipidemia, unspecified: Secondary | ICD-10-CM | POA: Diagnosis not present

## 2018-04-28 DIAGNOSIS — Z125 Encounter for screening for malignant neoplasm of prostate: Secondary | ICD-10-CM | POA: Diagnosis not present

## 2018-04-28 DIAGNOSIS — Z8572 Personal history of non-Hodgkin lymphomas: Secondary | ICD-10-CM | POA: Diagnosis not present

## 2018-04-28 DIAGNOSIS — R319 Hematuria, unspecified: Secondary | ICD-10-CM | POA: Diagnosis not present

## 2018-04-28 DIAGNOSIS — Z0000 Encounter for general adult medical examination without abnormal findings: Secondary | ICD-10-CM | POA: Diagnosis not present

## 2018-09-27 ENCOUNTER — Telehealth: Payer: Self-pay | Admitting: *Deleted

## 2018-09-27 NOTE — Telephone Encounter (Signed)
Per 4/20 los appts on 5/13 have been cancelled and rescheduled to 11/11. Patient aware of new appts

## 2018-10-19 ENCOUNTER — Other Ambulatory Visit: Payer: Medicare HMO

## 2018-10-19 ENCOUNTER — Ambulatory Visit: Payer: Medicare HMO | Admitting: Oncology

## 2019-01-30 DIAGNOSIS — E162 Hypoglycemia, unspecified: Secondary | ICD-10-CM | POA: Diagnosis not present

## 2019-01-30 DIAGNOSIS — E161 Other hypoglycemia: Secondary | ICD-10-CM | POA: Diagnosis not present

## 2019-01-30 DIAGNOSIS — R69 Illness, unspecified: Secondary | ICD-10-CM | POA: Diagnosis not present

## 2019-03-15 DIAGNOSIS — R69 Illness, unspecified: Secondary | ICD-10-CM | POA: Diagnosis not present

## 2019-04-19 ENCOUNTER — Inpatient Hospital Stay: Payer: Medicare HMO

## 2019-04-19 ENCOUNTER — Inpatient Hospital Stay: Payer: Medicare HMO | Attending: Oncology | Admitting: Oncology

## 2019-04-19 ENCOUNTER — Other Ambulatory Visit: Payer: Self-pay

## 2019-04-19 VITALS — BP 134/69 | HR 78 | Temp 98.3°F | Resp 18 | Ht 72.0 in | Wt 202.9 lb

## 2019-04-19 DIAGNOSIS — I10 Essential (primary) hypertension: Secondary | ICD-10-CM | POA: Diagnosis not present

## 2019-04-19 DIAGNOSIS — C8590 Non-Hodgkin lymphoma, unspecified, unspecified site: Secondary | ICD-10-CM

## 2019-04-19 DIAGNOSIS — C8179 Other classical Hodgkin lymphoma, extranodal and solid organ sites: Secondary | ICD-10-CM | POA: Diagnosis not present

## 2019-04-19 LAB — CBC WITH DIFFERENTIAL (CANCER CENTER ONLY)
Abs Immature Granulocytes: 0.01 10*3/uL (ref 0.00–0.07)
Basophils Absolute: 0 10*3/uL (ref 0.0–0.1)
Basophils Relative: 0 %
Eosinophils Absolute: 0.1 10*3/uL (ref 0.0–0.5)
Eosinophils Relative: 1 %
HCT: 42 % (ref 39.0–52.0)
Hemoglobin: 14.6 g/dL (ref 13.0–17.0)
Immature Granulocytes: 0 %
Lymphocytes Relative: 25 %
Lymphs Abs: 1.3 10*3/uL (ref 0.7–4.0)
MCH: 30.1 pg (ref 26.0–34.0)
MCHC: 34.8 g/dL (ref 30.0–36.0)
MCV: 86.6 fL (ref 80.0–100.0)
Monocytes Absolute: 0.3 10*3/uL (ref 0.1–1.0)
Monocytes Relative: 6 %
Neutro Abs: 3.3 10*3/uL (ref 1.7–7.7)
Neutrophils Relative %: 68 %
Platelet Count: 202 10*3/uL (ref 150–400)
RBC: 4.85 MIL/uL (ref 4.22–5.81)
RDW: 13.8 % (ref 11.5–15.5)
WBC Count: 5 10*3/uL (ref 4.0–10.5)
nRBC: 0 % (ref 0.0–0.2)

## 2019-04-19 LAB — CMP (CANCER CENTER ONLY)
ALT: 12 U/L (ref 0–44)
AST: 13 U/L — ABNORMAL LOW (ref 15–41)
Albumin: 3.9 g/dL (ref 3.5–5.0)
Alkaline Phosphatase: 47 U/L (ref 38–126)
Anion gap: 9 (ref 5–15)
BUN: 23 mg/dL (ref 8–23)
CO2: 31 mmol/L (ref 22–32)
Calcium: 9.1 mg/dL (ref 8.9–10.3)
Chloride: 101 mmol/L (ref 98–111)
Creatinine: 1 mg/dL (ref 0.61–1.24)
GFR, Est AFR Am: >60 mL/min (ref >60)
GFR, Estimated: >60 mL/min (ref >60)
Glucose, Bld: 113 mg/dL — ABNORMAL HIGH (ref 70–99)
Potassium: 3.4 mmol/L — ABNORMAL LOW (ref 3.5–5.1)
Sodium: 141 mmol/L (ref 135–145)
Total Bilirubin: 1 mg/dL (ref 0.3–1.2)
Total Protein: 6.5 g/dL (ref 6.5–8.1)

## 2019-04-19 NOTE — Progress Notes (Signed)
Hematology and Oncology Follow Up Visit  Clifford [name] [id number] [birth year] 73 y.o. 04/19/2019 9:03 AM   Principle Diagnosis: 73 year old man with low-grade lymphoma diagnosed in 2007.  He was found to have marginal zone subtype with a pulmonary involvement.   Prior Therapy: 1. Status post pleurodesis and biopsy of the pleural-based nodule. 2. Received 6 cycles of CVP with rituximab. Therapy concluded in December 2007 after achieving remission at the time.  Current therapy: Active surveillance.  Interim History:  Clifford [name] here for return evaluation.  Since the last visit, he reports no major changes in his health.  He denies any recent hospitalization or illnesses.  He denies any respiratory complaints.  He denies any cough or wheezing.  He did report weight loss that is intentional for the last year.  Continues to work part-time and [employer] regularly.   Patient denied any alteration mental status, neuropathy, confusion or dizziness.  Denies any headaches or lethargy.  Denies any night sweats, weight loss or changes in appetite.  Denied orthopnea, dyspnea on exertion or chest discomfort.  Denies shortness of breath, difficulty breathing hemoptysis or cough.  Denies any abdominal distention, nausea, early satiety or dyspepsia.  Denies any hematuria, frequency, dysuria or nocturia.  Denies any skin irritation, dryness or rash.  Denies any ecchymosis or petechiae.  Denies any lymphadenopathy or clotting.  Denies any heat or cold intolerance.  Denies any anxiety or depression.  Remaining review of system is negative.     Medications: Without any changes on review. Current Outpatient Medications  Medication Sig Dispense Refill  . amLODipine (NORVASC) 5 MG tablet Take 5 mg by mouth daily.     Marland Kitchen aspirin 81 MG tablet Take 81 mg by mouth daily.      . cetirizine (ZYRTEC) 10 MG tablet Take 10 mg by mouth daily as needed for allergies.    . hydrochlorothiazide (HYDRODIURIL) 25 MG tablet Take 25 mg  by mouth daily.    Marland Kitchen KLOR-CON M20 20 MEQ tablet Take 20 mEq by mouth daily.  1  . oxyCODONE-acetaminophen (PERCOCET/ROXICET) 5-325 MG tablet Take 1 tablet by mouth every 4 (four) hours as needed for severe pain. 15 tablet 0   No current facility-administered medications for this visit.     Allergies: No Known Allergies  Past Medical History, Surgical history, Social history, and Family History unchanged on review.   Physical Exam:   ECOG: 0     General appearance: Comfortable appearing without any discomfort Head: Normocephalic without any trauma Oropharynx: Mucous membranes are moist and pink without any thrush or ulcers. Eyes: Pupils are equal and round reactive to light. Lymph nodes: No cervical, supraclavicular, inguinal or axillary lymphadenopathy.   Heart:regular rate and rhythm.  S1 and S2 without leg edema. Lung: Clear without any rhonchi or wheezes.  No dullness to percussion. Abdomin: Soft, nontender, nondistended with good bowel sounds.  No hepatosplenomegaly. Musculoskeletal: No joint deformity or effusion.  Full range of motion noted. Neurological: No deficits noted on motor, sensory and deep tendon reflex exam. Skin: No petechial rash or dryness.  Appeared moist.   Lab Results: Lab Results  Component Value Date   WBC 4.5 10/20/2017   HGB 17.0 10/20/2017   HCT 57.8 (H) 10/20/2017   MCV 109.3 (H) 10/20/2017   PLT 149 10/20/2017     Chemistry      Component Value Date/Time   NA 142 10/20/2017 0817   NA 144 10/21/2016 0808   K 3.8 10/20/2017 0817   K  4.0 10/21/2016 0808   CL 103 10/20/2017 0817   CL 109 (H) 10/26/2012 0841   CO2 30 (H) 10/20/2017 0817   CO2 29 10/21/2016 0808   BUN 19 10/20/2017 0817   BUN 15.3 10/21/2016 0808   CREATININE 1.17 10/20/2017 0817   CREATININE 1.0 10/21/2016 0808      Component Value Date/Time   CALCIUM 9.5 10/20/2017 0817   CALCIUM 9.3 10/21/2016 0808   ALKPHOS 55 10/20/2017 0817   ALKPHOS 53 10/21/2016 0808   AST  24 10/20/2017 0817   AST 27 10/21/2016 0808   ALT 40 10/20/2017 0817   ALT 37 10/21/2016 0808   BILITOT 1.0 10/20/2017 0817   BILITOT 1.09 10/21/2016 0808      Impression and Plan:  73 year old with:  1.  Hodgkin's lymphoma diagnosed in 2007 after presenting with marginal zone subtype and pulmonary involvement.  He status post therapy outlined above and has remained in remission.  The natural course of this disease was outlined today as well as risk of relapse.  Different salvage therapy options were reviewed if needed in the future.  He does not exhibit any signs or symptoms to suggest relapsed disease and additional imaging studies will only be done if he has any symptoms.  He is agreeable at this time.   2.  Hypertension: Blood pressure is close to normal range at this time without any adjustments.  He continues to follow-up with his primary care regarding this.  3.  Follow-up: Will be in 1 year.   15  minutes was spent with the patient face-to-face today.  More than 50% of time was spent on reviewing laboratory data, updating his disease status as well as future plan of care.    Zola Button, MD 11/11/20209:03 AM

## 2019-04-20 ENCOUNTER — Telehealth: Payer: Self-pay | Admitting: Oncology

## 2019-04-20 NOTE — Telephone Encounter (Signed)
Scheduled appt per 11/11 los.  Spoke with pt and he is aware of his appt date and time.

## 2019-06-30 DIAGNOSIS — Z8572 Personal history of non-Hodgkin lymphomas: Secondary | ICD-10-CM | POA: Diagnosis not present

## 2019-06-30 DIAGNOSIS — I10 Essential (primary) hypertension: Secondary | ICD-10-CM | POA: Diagnosis not present

## 2019-06-30 DIAGNOSIS — Z125 Encounter for screening for malignant neoplasm of prostate: Secondary | ICD-10-CM | POA: Diagnosis not present

## 2019-06-30 DIAGNOSIS — Z1211 Encounter for screening for malignant neoplasm of colon: Secondary | ICD-10-CM | POA: Diagnosis not present

## 2019-06-30 DIAGNOSIS — M79642 Pain in left hand: Secondary | ICD-10-CM | POA: Diagnosis not present

## 2019-06-30 DIAGNOSIS — E785 Hyperlipidemia, unspecified: Secondary | ICD-10-CM | POA: Diagnosis not present

## 2019-06-30 DIAGNOSIS — Z0000 Encounter for general adult medical examination without abnormal findings: Secondary | ICD-10-CM | POA: Diagnosis not present

## 2019-06-30 DIAGNOSIS — R7309 Other abnormal glucose: Secondary | ICD-10-CM | POA: Diagnosis not present

## 2019-07-11 DIAGNOSIS — B349 Viral infection, unspecified: Secondary | ICD-10-CM | POA: Diagnosis not present

## 2019-07-12 DIAGNOSIS — R5383 Other fatigue: Secondary | ICD-10-CM | POA: Diagnosis not present

## 2019-07-12 DIAGNOSIS — R52 Pain, unspecified: Secondary | ICD-10-CM | POA: Diagnosis not present

## 2019-07-12 DIAGNOSIS — Z20822 Contact with and (suspected) exposure to covid-19: Secondary | ICD-10-CM | POA: Diagnosis not present

## 2019-07-18 DIAGNOSIS — E785 Hyperlipidemia, unspecified: Secondary | ICD-10-CM | POA: Diagnosis not present

## 2019-07-18 DIAGNOSIS — Z125 Encounter for screening for malignant neoplasm of prostate: Secondary | ICD-10-CM | POA: Diagnosis not present

## 2019-07-18 DIAGNOSIS — Z8572 Personal history of non-Hodgkin lymphomas: Secondary | ICD-10-CM | POA: Diagnosis not present

## 2019-07-18 DIAGNOSIS — R7309 Other abnormal glucose: Secondary | ICD-10-CM | POA: Diagnosis not present

## 2019-07-18 DIAGNOSIS — Z0000 Encounter for general adult medical examination without abnormal findings: Secondary | ICD-10-CM | POA: Diagnosis not present

## 2019-08-07 DIAGNOSIS — Z1159 Encounter for screening for other viral diseases: Secondary | ICD-10-CM | POA: Diagnosis not present

## 2019-08-10 DIAGNOSIS — D123 Benign neoplasm of transverse colon: Secondary | ICD-10-CM | POA: Diagnosis not present

## 2019-08-10 DIAGNOSIS — Z86010 Personal history of colonic polyps: Secondary | ICD-10-CM | POA: Diagnosis not present

## 2019-08-10 DIAGNOSIS — K621 Rectal polyp: Secondary | ICD-10-CM | POA: Diagnosis not present

## 2019-08-10 DIAGNOSIS — K5730 Diverticulosis of large intestine without perforation or abscess without bleeding: Secondary | ICD-10-CM | POA: Diagnosis not present

## 2019-08-10 DIAGNOSIS — K640 First degree hemorrhoids: Secondary | ICD-10-CM | POA: Diagnosis not present

## 2019-08-10 DIAGNOSIS — K635 Polyp of colon: Secondary | ICD-10-CM | POA: Diagnosis not present

## 2019-08-15 DIAGNOSIS — K635 Polyp of colon: Secondary | ICD-10-CM | POA: Diagnosis not present

## 2019-08-15 DIAGNOSIS — K621 Rectal polyp: Secondary | ICD-10-CM | POA: Diagnosis not present

## 2019-08-15 DIAGNOSIS — D123 Benign neoplasm of transverse colon: Secondary | ICD-10-CM | POA: Diagnosis not present

## 2019-10-30 DIAGNOSIS — R972 Elevated prostate specific antigen [PSA]: Secondary | ICD-10-CM | POA: Diagnosis not present

## 2019-10-30 DIAGNOSIS — E785 Hyperlipidemia, unspecified: Secondary | ICD-10-CM | POA: Diagnosis not present

## 2020-03-15 DIAGNOSIS — R69 Illness, unspecified: Secondary | ICD-10-CM | POA: Diagnosis not present

## 2020-04-11 DIAGNOSIS — H5203 Hypermetropia, bilateral: Secondary | ICD-10-CM | POA: Diagnosis not present

## 2020-04-18 ENCOUNTER — Inpatient Hospital Stay: Payer: Medicare HMO | Admitting: Oncology

## 2020-04-18 ENCOUNTER — Inpatient Hospital Stay: Payer: Medicare HMO | Attending: Oncology

## 2020-04-18 ENCOUNTER — Other Ambulatory Visit: Payer: Self-pay

## 2020-04-18 VITALS — BP 148/85 | HR 72 | Temp 97.1°F | Resp 18 | Ht 72.0 in | Wt 218.3 lb

## 2020-04-18 DIAGNOSIS — C884 Extranodal marginal zone B-cell lymphoma of mucosa-associated lymphoid tissue [MALT-lymphoma]: Secondary | ICD-10-CM | POA: Diagnosis present

## 2020-04-18 DIAGNOSIS — I10 Essential (primary) hypertension: Secondary | ICD-10-CM | POA: Diagnosis not present

## 2020-04-18 DIAGNOSIS — C8590 Non-Hodgkin lymphoma, unspecified, unspecified site: Secondary | ICD-10-CM

## 2020-04-18 DIAGNOSIS — Z79899 Other long term (current) drug therapy: Secondary | ICD-10-CM | POA: Diagnosis not present

## 2020-04-18 LAB — CBC WITH DIFFERENTIAL (CANCER CENTER ONLY)
Abs Immature Granulocytes: 0.02 10*3/uL (ref 0.00–0.07)
Basophils Absolute: 0 10*3/uL (ref 0.0–0.1)
Basophils Relative: 1 %
Eosinophils Absolute: 0.1 10*3/uL (ref 0.0–0.5)
Eosinophils Relative: 2 %
HCT: 45.4 % (ref 39.0–52.0)
Hemoglobin: 16.2 g/dL (ref 13.0–17.0)
Immature Granulocytes: 0 %
Lymphocytes Relative: 24 %
Lymphs Abs: 1.5 10*3/uL (ref 0.7–4.0)
MCH: 31.1 pg (ref 26.0–34.0)
MCHC: 35.7 g/dL (ref 30.0–36.0)
MCV: 87.1 fL (ref 80.0–100.0)
Monocytes Absolute: 0.5 10*3/uL (ref 0.1–1.0)
Monocytes Relative: 7 %
Neutro Abs: 4 10*3/uL (ref 1.7–7.7)
Neutrophils Relative %: 66 %
Platelet Count: 227 10*3/uL (ref 150–400)
RBC: 5.21 MIL/uL (ref 4.22–5.81)
RDW: 13.7 % (ref 11.5–15.5)
WBC Count: 6.1 10*3/uL (ref 4.0–10.5)
nRBC: 0 % (ref 0.0–0.2)

## 2020-04-18 LAB — CMP (CANCER CENTER ONLY)
ALT: 16 U/L (ref 0–44)
AST: 15 U/L (ref 15–41)
Albumin: 4 g/dL (ref 3.5–5.0)
Alkaline Phosphatase: 70 U/L (ref 38–126)
Anion gap: 11 (ref 5–15)
BUN: 16 mg/dL (ref 8–23)
CO2: 30 mmol/L (ref 22–32)
Calcium: 9.7 mg/dL (ref 8.9–10.3)
Chloride: 101 mmol/L (ref 98–111)
Creatinine: 0.98 mg/dL (ref 0.61–1.24)
GFR, Estimated: >60 mL/min (ref >60)
Glucose, Bld: 113 mg/dL — ABNORMAL HIGH (ref 70–99)
Potassium: 3.6 mmol/L (ref 3.5–5.1)
Sodium: 142 mmol/L (ref 135–145)
Total Bilirubin: 1.6 mg/dL — ABNORMAL HIGH (ref 0.3–1.2)
Total Protein: 7.5 g/dL (ref 6.5–8.1)

## 2020-04-18 NOTE — Progress Notes (Signed)
Hematology and Oncology Follow Up Visit  Clifford [name] [id number] [birth year] 74 y.o. 04/18/2020 8:06 AM   Principle Diagnosis: 74 year old man with marginal zone low-grade lymphoma diagnosed in 2007.  He presented with pleural involvement.  Prior Therapy: 1. Status post pleurodesis and biopsy of the pleural-based nodule. 2. Received 6 cycles of CVP with rituximab. Therapy concluded in December 2007 after achieving remission at the time.  Current therapy: Active surveillance.  Interim History:  Clifford [name] presents today for a repeat follow-up.  Since the last visit, he reports no major changes in his health.  He denies any recent hospitalization or illnesses.  He denies any chest pain, dyspnea on exertion or weight loss.  He denies any lymphadenopathy or petechiae.  He remains active and continues to work part-time.     Medications: Updated on review. Current Outpatient Medications  Medication Sig Dispense Refill  . amLODipine (NORVASC) 5 MG tablet Take 5 mg by mouth daily.     Marland Kitchen aspirin 81 MG tablet Take 81 mg by mouth daily.      . cetirizine (ZYRTEC) 10 MG tablet Take 10 mg by mouth daily as needed for allergies.    . hydrochlorothiazide (HYDRODIURIL) 25 MG tablet Take 25 mg by mouth daily.    Marland Kitchen KLOR-CON M20 20 MEQ tablet Take 20 mEq by mouth daily.  1  . oxyCODONE-acetaminophen (PERCOCET/ROXICET) 5-325 MG tablet Take 1 tablet by mouth every 4 (four) hours as needed for severe pain. 15 tablet 0   No current facility-administered medications for this visit.    Allergies: No Known Allergies     Physical Exam: Blood pressure (!) 148/85, pulse 72, temperature (!) 97.1 F (36.2 C), temperature source Tympanic, resp. rate 18, height 6' (1.829 m), weight 218 lb 4.8 oz (99 kg), SpO2 98 %.    ECOG: 0   General appearance: Alert, awake without any distress. Head: Atraumatic without abnormalities Oropharynx: Without any thrush or ulcers. Eyes: No scleral icterus. Lymph nodes:  No lymphadenopathy noted in the cervical, supraclavicular, or axillary nodes Heart:regular rate and rhythm, without any murmurs or gallops.   Lung: Clear to auscultation without any rhonchi, wheezes or dullness to percussion. Abdomin: Soft, nontender without any shifting dullness or ascites. Musculoskeletal: No clubbing or cyanosis. Neurological: No motor or sensory deficits. Skin: No rashes or lesions.   Lab Results: Lab Results  Component Value Date   WBC 5.0 04/19/2019   HGB 14.6 04/19/2019   HCT 42.0 04/19/2019   MCV 86.6 04/19/2019   PLT 202 04/19/2019     Chemistry      Component Value Date/Time   NA 141 04/19/2019 0849   NA 144 10/21/2016 0808   K 3.4 (L) 04/19/2019 0849   K 4.0 10/21/2016 0808   CL 101 04/19/2019 0849   CL 109 (H) 10/26/2012 0841   CO2 31 04/19/2019 0849   CO2 29 10/21/2016 0808   BUN 23 04/19/2019 0849   BUN 15.3 10/21/2016 0808   CREATININE 1.00 04/19/2019 0849   CREATININE 1.0 10/21/2016 0808      Component Value Date/Time   CALCIUM 9.1 04/19/2019 0849   CALCIUM 9.3 10/21/2016 0808   ALKPHOS 47 04/19/2019 0849   ALKPHOS 53 10/21/2016 0808   AST 13 (L) 04/19/2019 0849   AST 27 10/21/2016 0808   ALT 12 04/19/2019 0849   ALT 37 10/21/2016 0808   BILITOT 1.0 04/19/2019 0849   BILITOT 1.09 10/21/2016 0808      Impression and Plan:  74 year old  with:  1.  Marginal zone lymphoma diagnosed in 2007.  He presented with global involvement and remains in remission at this time.    He is currently on active surveillance without any evidence of relapse since his initial treatment.  Risk of relapse and the natural course of his disease was discussed.  Despite the long interval of follow-up remission he still at risk of developing recurrent disease given the incurable nature of these lymphomas.  Continue to educate him about signs and symptoms of progression of disease which she has none at this time.  Repeat imaging studies will be done if he has  clinical suspicion.  Salvage therapy for this disease would be retreatment with systemic chemotherapy versus oral targeted therapy.  He is agreeable with this plan.   2.  Hypertension: His blood pressure is mildly elevated today but overall under control between visits.  3.  Follow-up: In 1 year for repeat evaluation.   30  minutes were dedicated to this visit.  The time was spent on reviewing his disease status, reviewing risk of relapse and management options for the future.   Zola Button, MD 11/11/20218:06 AM

## 2020-05-21 DIAGNOSIS — J40 Bronchitis, not specified as acute or chronic: Secondary | ICD-10-CM | POA: Diagnosis not present

## 2020-05-21 DIAGNOSIS — R059 Cough, unspecified: Secondary | ICD-10-CM | POA: Diagnosis not present

## 2020-07-09 ENCOUNTER — Other Ambulatory Visit: Payer: Self-pay | Admitting: Family Medicine

## 2020-07-09 ENCOUNTER — Ambulatory Visit
Admission: RE | Admit: 2020-07-09 | Discharge: 2020-07-09 | Disposition: A | Discharge status: Home / Self Care | Payer: Medicare HMO | Source: Ambulatory Visit | Attending: Family Medicine | Admitting: Family Medicine

## 2020-07-09 DIAGNOSIS — R059 Cough, unspecified: Secondary | ICD-10-CM | POA: Diagnosis not present

## 2020-07-09 DIAGNOSIS — J988 Other specified respiratory disorders: Secondary | ICD-10-CM | POA: Diagnosis not present

## 2020-07-09 DIAGNOSIS — C3490 Malignant neoplasm of unspecified part of unspecified bronchus or lung: Secondary | ICD-10-CM

## 2020-07-09 HISTORY — DX: Malignant neoplasm of unspecified part of unspecified bronchus or lung: C34.90

## 2020-07-10 ENCOUNTER — Other Ambulatory Visit: Payer: Self-pay | Admitting: Family Medicine

## 2020-07-10 ENCOUNTER — Other Ambulatory Visit: Payer: Self-pay | Admitting: Oncology

## 2020-07-10 DIAGNOSIS — C8590 Non-Hodgkin lymphoma, unspecified, unspecified site: Secondary | ICD-10-CM

## 2020-07-10 DIAGNOSIS — R918 Other nonspecific abnormal finding of lung field: Secondary | ICD-10-CM

## 2020-07-11 ENCOUNTER — Telehealth: Payer: Self-pay | Admitting: Oncology

## 2020-07-11 ENCOUNTER — Ambulatory Visit
Admission: RE | Admit: 2020-07-11 | Discharge: 2020-07-11 | Disposition: A | Discharge status: Home / Self Care | Payer: Medicare HMO | Source: Ambulatory Visit | Attending: Family Medicine | Admitting: Family Medicine

## 2020-07-11 DIAGNOSIS — J9811 Atelectasis: Secondary | ICD-10-CM | POA: Diagnosis not present

## 2020-07-11 DIAGNOSIS — C8590 Non-Hodgkin lymphoma, unspecified, unspecified site: Secondary | ICD-10-CM | POA: Diagnosis not present

## 2020-07-11 DIAGNOSIS — J90 Pleural effusion, not elsewhere classified: Secondary | ICD-10-CM | POA: Diagnosis not present

## 2020-07-11 DIAGNOSIS — R918 Other nonspecific abnormal finding of lung field: Secondary | ICD-10-CM

## 2020-07-11 DIAGNOSIS — I2510 Atherosclerotic heart disease of native coronary artery without angina pectoris: Secondary | ICD-10-CM | POA: Diagnosis not present

## 2020-07-11 MED ORDER — IOPAMIDOL (ISOVUE-300) INJECTION 61%
75.0000 mL | Freq: Once | INTRAVENOUS | Status: AC | PRN
  Administered 2020-07-11: 75 mL via INTRAVENOUS

## 2020-07-11 NOTE — Telephone Encounter (Signed)
Scheduled appt per 2/2 sch msg - pt is aware of apt date and time

## 2020-07-15 ENCOUNTER — Inpatient Hospital Stay: Payer: Medicare HMO

## 2020-07-15 ENCOUNTER — Other Ambulatory Visit: Payer: Self-pay

## 2020-07-15 ENCOUNTER — Inpatient Hospital Stay: Payer: Medicare HMO | Attending: Oncology | Admitting: Oncology

## 2020-07-15 VITALS — BP 163/90 | HR 97 | Temp 98.4°F | Resp 18 | Wt 224.7 lb

## 2020-07-15 DIAGNOSIS — C8590 Non-Hodgkin lymphoma, unspecified, unspecified site: Secondary | ICD-10-CM

## 2020-07-15 DIAGNOSIS — C884 Extranodal marginal zone B-cell lymphoma of mucosa-associated lymphoid tissue [MALT-lymphoma]: Secondary | ICD-10-CM | POA: Insufficient documentation

## 2020-07-15 DIAGNOSIS — R918 Other nonspecific abnormal finding of lung field: Secondary | ICD-10-CM | POA: Insufficient documentation

## 2020-07-15 DIAGNOSIS — K760 Fatty (change of) liver, not elsewhere classified: Secondary | ICD-10-CM | POA: Insufficient documentation

## 2020-07-15 LAB — CBC WITH DIFFERENTIAL (CANCER CENTER ONLY)
Abs Immature Granulocytes: 0.05 10*3/uL (ref 0.00–0.07)
Basophils Absolute: 0.1 10*3/uL (ref 0.0–0.1)
Basophils Relative: 1 %
Eosinophils Absolute: 0.2 10*3/uL (ref 0.0–0.5)
Eosinophils Relative: 2 %
HCT: 46.1 % (ref 39.0–52.0)
Hemoglobin: 16.3 g/dL (ref 13.0–17.0)
Immature Granulocytes: 1 %
Lymphocytes Relative: 26 %
Lymphs Abs: 2.2 10*3/uL (ref 0.7–4.0)
MCH: 31.1 pg (ref 26.0–34.0)
MCHC: 35.4 g/dL (ref 30.0–36.0)
MCV: 88 fL (ref 80.0–100.0)
Monocytes Absolute: 0.6 10*3/uL (ref 0.1–1.0)
Monocytes Relative: 7 %
Neutro Abs: 5.5 10*3/uL (ref 1.7–7.7)
Neutrophils Relative %: 63 %
Platelet Count: 252 10*3/uL (ref 150–400)
RBC: 5.24 MIL/uL (ref 4.22–5.81)
RDW: 14.2 % (ref 11.5–15.5)
WBC Count: 8.5 10*3/uL (ref 4.0–10.5)
nRBC: 0 % (ref 0.0–0.2)

## 2020-07-15 LAB — CMP (CANCER CENTER ONLY)
ALT: 28 U/L (ref 0–44)
AST: 19 U/L (ref 15–41)
Albumin: 3.5 g/dL (ref 3.5–5.0)
Alkaline Phosphatase: 74 U/L (ref 38–126)
Anion gap: 9 (ref 5–15)
BUN: 13 mg/dL (ref 8–23)
CO2: 30 mmol/L (ref 22–32)
Calcium: 8.9 mg/dL (ref 8.9–10.3)
Chloride: 101 mmol/L (ref 98–111)
Creatinine: 0.99 mg/dL (ref 0.61–1.24)
GFR, Estimated: >60 mL/min (ref >60)
Glucose, Bld: 92 mg/dL (ref 70–99)
Potassium: 3.3 mmol/L — ABNORMAL LOW (ref 3.5–5.1)
Sodium: 140 mmol/L (ref 135–145)
Total Bilirubin: 1 mg/dL (ref 0.3–1.2)
Total Protein: 6.9 g/dL (ref 6.5–8.1)

## 2020-07-15 NOTE — Progress Notes (Signed)
Hematology and Oncology Follow Up Visit  Clifford [name] [id number] 1946-01-08 75 y.o. 07/15/2020 10:19 AM   Principle Diagnosis: 75 year old man with low-grade lymphoma diagnosed in 2007.  He achieved a complete response after treating his marginal zone with pleural involvement.   Secondary diagnosis: Right lower lung mass noted and February 2022.  Work-up is ongoing.  Prior Therapy: 1. Status post pleurodesis and biopsy of the pleural-based nodule. 2. Received 6 cycles of CVP with rituximab. Therapy concluded in December 2007 after achieving remission at the time.  Current therapy: Active surveillance.  Interim History:  Clifford [name] returns today for repeat evaluation.  Since the last visit, he was evaluated by his primary care physician for recurrent cough but no shortness of breath or difficulty breathing.  He was treated with inhalers and antibiotics without improvement and subsequently a chest x-ray on July 09, 2020.  The x-ray showed right lower lobe mass suspicious for malignancy.  CT scan obtained on February 3 showed a 9.0 cm mass in the superior segment of the right lower lobe without any evidence of clear-cut adenopathy.  9 mm left lower lobe intermediate pulmonary nodule was also noted.  Clinically, he reports no shortness of breath or chest pain.  He denies any hemoptysis.  Continues to have chronic nonproductive cough at this time.  He continues to work part-time without any decline in ability to do so.  His performance status quality of life remain excellent.     Medications: Updated on review. Current Outpatient Medications  Medication Sig Dispense Refill  . amLODipine (NORVASC) 5 MG tablet Take 5 mg by mouth daily.     Marland Kitchen aspirin 81 MG tablet Take 81 mg by mouth daily.      . cetirizine (ZYRTEC) 10 MG tablet Take 10 mg by mouth daily as needed for allergies.    . hydrochlorothiazide (HYDRODIURIL) 25 MG tablet Take 25 mg by mouth daily.    Marland Kitchen KLOR-CON M20 20 MEQ tablet Take  20 mEq by mouth daily.  1  . oxyCODONE-acetaminophen (PERCOCET/ROXICET) 5-325 MG tablet Take 1 tablet by mouth every 4 (four) hours as needed for severe pain. 15 tablet 0   No current facility-administered medications for this visit.    Allergies: No Known Allergies     Physical Exam: Blood pressure (!) 163/90, pulse 97, temperature 98.4 F (36.9 C), temperature source Tympanic, resp. rate 18, weight 224 lb 11.2 oz (101.9 kg), SpO2 98 %.    ECOG: 0    General appearance: Comfortable appearing without any discomfort Head: Normocephalic without any trauma Oropharynx: Mucous membranes are moist and pink without any thrush or ulcers. Eyes: Pupils are equal and round reactive to light. Lymph nodes: No cervical, supraclavicular, inguinal or axillary lymphadenopathy.   Heart:regular rate and rhythm.  S1 and S2 without leg edema. Lung: Clear without any rhonchi or wheezes.  No dullness to percussion. Abdomin: Soft, nontender, nondistended with good bowel sounds.  No hepatosplenomegaly. Musculoskeletal: No joint deformity or effusion.  Full range of motion noted. Neurological: No deficits noted on motor, sensory and deep tendon reflex exam. Skin: No petechial rash or dryness.  Appeared moist.     Lab Results: Lab Results  Component Value Date   WBC 8.5 07/15/2020   HGB 16.3 07/15/2020   HCT 46.1 07/15/2020   MCV 88.0 07/15/2020   PLT 252 07/15/2020     Chemistry      Component Value Date/Time   NA 140 07/15/2020 0935   NA 144 10/21/2016  0981   K 3.3 (L) 07/15/2020 0935   K 4.0 10/21/2016 0808   CL 101 07/15/2020 0935   CL 109 (H) 10/26/2012 0841   CO2 30 07/15/2020 0935   CO2 29 10/21/2016 0808   BUN 13 07/15/2020 0935   BUN 15.3 10/21/2016 0808   CREATININE 0.99 07/15/2020 0935   CREATININE 1.0 10/21/2016 0808      Component Value Date/Time   CALCIUM 8.9 07/15/2020 0935   CALCIUM 9.3 10/21/2016 0808   ALKPHOS 74 07/15/2020 0935   ALKPHOS 53 10/21/2016 0808    AST 19 07/15/2020 0935   AST 27 10/21/2016 0808   ALT 28 07/15/2020 0935   ALT 37 10/21/2016 0808   BILITOT 1.0 07/15/2020 0935   BILITOT 1.09 10/21/2016 0808    IMPRESSION: 9.0 cm mass within the superior segment of the right lower lobe compatible with a probable primary bronchogenic neoplasm. This should be easily amenable to CT-guided biopsy for further evaluation. CT imaging of the abdomen and pelvis is recommended for staging purposes. No evidence of regional nodal adenopathy. Indeterminate pulmonary nodule noted within the left lower lobe, still of low likelihood for representing an intrapulmonary metastasis.  9 mm left lower lobe indeterminate pulmonary nodule. Comparison with prior examinations, if available, would be helpful in determining stability.  Extensive multi-vessel coronary artery calcification  Mild to moderate hepatic steatosis.  Aortic Atherosclerosis (ICD10-I70.0).  Impression and Plan:  75 year old with:        1.  9.0 cm right lower lung mass detected on CT scan July 11, 2020.  This was obtained after abnormal chest x-ray and persistent cough.  The differential diagnosis of these findings were reviewed at this time.  CT scan images on February 3 were reviewed with the patient.  Primary lung neoplasm appears to be the most likely diagnosis although recurrent lymphoma or different malignancy versus benign etiology considered less likely.  From a management standpoint, obtaining tissue biopsy would be the next step.  CT-guided percutaneous biopsy would be a preferred approach at this time with surgical approach would be preferred as a backup if percutaneous biopsy cannot be obtained.  PET CT scan is already scheduled to complete his staging.  Treating these findings will be dependent upon the nature of these findings.  This could include systemic chemotherapy alone for small cell lung cancer, combination of chemotherapy and radiation for locally  advanced lung cancer versus surgical resection as a part of a trimodality approach after neoadjuvant treatment.  This will be determined after complete his staging as well as tissue biopsy.  He will follow-up after that to discuss management choices   2.  Low-grade lymphoma currently in remission diagnosed in 2007.  He was found to have marginal zone lymphoma at that time.  He remains on active surveillance at this time without any evidence of relapsed disease.  I do not suspect that his pulmonary mass is of a lymphoma etiology but it is possible given these primary pleural manifestation at the time of diagnosis.  3.  Follow-up: In the next few weeks to follow his progress.   30  minutes were spent on this encounter.  The time was dedicated to reviewing imaging studies, discussing differential diagnosis and management options for the future.   Zola Button, MD 2/7/202210:19 AM

## 2020-07-22 ENCOUNTER — Ambulatory Visit (HOSPITAL_COMMUNITY)
Admission: RE | Admit: 2020-07-22 | Discharge: 2020-07-22 | Disposition: A | Discharge status: Home / Self Care | Payer: Medicare HMO | Source: Ambulatory Visit | Attending: Oncology | Admitting: Oncology

## 2020-07-22 ENCOUNTER — Other Ambulatory Visit: Payer: Self-pay

## 2020-07-22 DIAGNOSIS — I700 Atherosclerosis of aorta: Secondary | ICD-10-CM | POA: Diagnosis not present

## 2020-07-22 DIAGNOSIS — K760 Fatty (change of) liver, not elsewhere classified: Secondary | ICD-10-CM | POA: Insufficient documentation

## 2020-07-22 DIAGNOSIS — R918 Other nonspecific abnormal finding of lung field: Secondary | ICD-10-CM | POA: Diagnosis not present

## 2020-07-22 DIAGNOSIS — C8590 Non-Hodgkin lymphoma, unspecified, unspecified site: Secondary | ICD-10-CM | POA: Diagnosis not present

## 2020-07-22 DIAGNOSIS — J984 Other disorders of lung: Secondary | ICD-10-CM | POA: Diagnosis not present

## 2020-07-22 LAB — GLUCOSE, CAPILLARY: Glucose-Capillary: 129 mg/dL — ABNORMAL HIGH (ref 70–99)

## 2020-07-22 MED ORDER — FLUDEOXYGLUCOSE F - 18 (FDG) INJECTION
11.6000 | Freq: Once | INTRAVENOUS | Status: AC | PRN
  Administered 2020-07-22: 11.2 via INTRAVENOUS

## 2020-07-23 ENCOUNTER — Telehealth: Payer: Self-pay

## 2020-07-23 NOTE — Telephone Encounter (Signed)
TCT pt. regarding ordered CT Biopsy. Per central scheduling: pt. just had PET done yesterday 07/22/20 which would be forwarded to the radiology doctor to determine when to schedule the biopsy. Pt. knows to expect a scheduling call.

## 2020-07-24 ENCOUNTER — Telehealth (HOSPITAL_COMMUNITY): Payer: Self-pay

## 2020-07-24 ENCOUNTER — Encounter (HOSPITAL_COMMUNITY): Payer: Self-pay

## 2020-07-24 NOTE — Progress Notes (Signed)
Clifford [name] Male, 75 y.o., [birth year]  MRN:  [id number] Phone:  [phone] Clifford [name])       PCP:  London Pepper, MD Coverage:  [place] [employer] Hmo/Ppo  Next Appt With Radiology ([facility] 3) 07/30/2020 at 11:00 AM           RE: Biopsy Received: Yesterday  Message Details  Markus Daft, MD  [facility] E CT guided biopsy of right lung mass, see PET CT.   [facility]    Previous Messages  ----- Message -----  From: Lenore Cordia  Sent: 07/23/2020 11:29 AM EST  To: [facility] Procedure Requests  Subject: Biopsy                      Procedure Requested: CT Biopsy    Reason for Procedure: lung mass    Provider Requesting: Wyatt Portela [phone]    Other Info: Pet Final

## 2020-07-25 ENCOUNTER — Telehealth: Payer: Self-pay | Admitting: *Deleted

## 2020-07-25 NOTE — Telephone Encounter (Signed)
Received call from pt asking to change his appt from 07/29/20 to the following week as he is having a biopsy on 07/30/20. He would need his follow up after the biopsy.  Scheduling message sent

## 2020-07-27 ENCOUNTER — Ambulatory Visit (HOSPITAL_COMMUNITY)
Admission: RE | Admit: 2020-07-27 | Discharge: 2020-07-27 | Disposition: A | Discharge status: Home / Self Care | Payer: Medicare HMO | Source: Ambulatory Visit | Attending: Oncology | Admitting: Oncology

## 2020-07-27 DIAGNOSIS — Z01812 Encounter for preprocedural laboratory examination: Secondary | ICD-10-CM | POA: Insufficient documentation

## 2020-07-27 DIAGNOSIS — Z20822 Contact with and (suspected) exposure to covid-19: Secondary | ICD-10-CM | POA: Diagnosis not present

## 2020-07-27 LAB — SARS CORONAVIRUS 2 (TAT 6-24 HRS): SARS Coronavirus 2: NEGATIVE

## 2020-07-29 ENCOUNTER — Other Ambulatory Visit: Payer: Self-pay | Admitting: Student

## 2020-07-29 ENCOUNTER — Ambulatory Visit: Payer: Medicare HMO | Admitting: Oncology

## 2020-07-30 ENCOUNTER — Ambulatory Visit (HOSPITAL_COMMUNITY)
Admission: RE | Admit: 2020-07-30 | Discharge: 2020-07-30 | Disposition: A | Discharge status: Home / Self Care | Payer: Medicare HMO | Source: Ambulatory Visit | Attending: Oncology | Admitting: Oncology

## 2020-07-30 ENCOUNTER — Other Ambulatory Visit: Payer: Self-pay

## 2020-07-30 ENCOUNTER — Encounter (HOSPITAL_COMMUNITY): Payer: Self-pay

## 2020-07-30 DIAGNOSIS — Z9889 Other specified postprocedural states: Secondary | ICD-10-CM

## 2020-07-30 DIAGNOSIS — J9811 Atelectasis: Secondary | ICD-10-CM | POA: Diagnosis not present

## 2020-07-30 DIAGNOSIS — C3432 Malignant neoplasm of lower lobe, left bronchus or lung: Secondary | ICD-10-CM | POA: Diagnosis not present

## 2020-07-30 DIAGNOSIS — Z8572 Personal history of non-Hodgkin lymphomas: Secondary | ICD-10-CM | POA: Insufficient documentation

## 2020-07-30 DIAGNOSIS — R053 Chronic cough: Secondary | ICD-10-CM | POA: Diagnosis not present

## 2020-07-30 DIAGNOSIS — Z87891 Personal history of nicotine dependence: Secondary | ICD-10-CM | POA: Insufficient documentation

## 2020-07-30 DIAGNOSIS — R918 Other nonspecific abnormal finding of lung field: Secondary | ICD-10-CM | POA: Diagnosis not present

## 2020-07-30 LAB — CBC
HCT: 43.5 % (ref 39.0–52.0)
Hemoglobin: 15.1 g/dL (ref 13.0–17.0)
MCH: 30.9 pg (ref 26.0–34.0)
MCHC: 34.7 g/dL (ref 30.0–36.0)
MCV: 89.1 fL (ref 80.0–100.0)
Platelets: 226 10*3/uL (ref 150–400)
RBC: 4.88 MIL/uL (ref 4.22–5.81)
RDW: 13.4 % (ref 11.5–15.5)
WBC: 5.3 10*3/uL (ref 4.0–10.5)
nRBC: 0 % (ref 0.0–0.2)

## 2020-07-30 LAB — PROTIME-INR
INR: 1 (ref 0.8–1.2)
Prothrombin Time: 13 seconds (ref 11.4–15.2)

## 2020-07-30 MED ORDER — SODIUM CHLORIDE 0.9 % IV SOLN: INTRAVENOUS | Status: DC

## 2020-07-30 MED ORDER — FENTANYL CITRATE (PF) 100 MCG/2ML IJ SOLN
INTRAMUSCULAR | Status: AC
  Filled 2020-07-30: qty 2

## 2020-07-30 MED ORDER — MIDAZOLAM HCL 2 MG/2ML IJ SOLN
INTRAMUSCULAR | Status: AC
  Filled 2020-07-30: qty 2

## 2020-07-30 MED ORDER — SODIUM CHLORIDE 0.9 % IV SOLN
INTRAVENOUS | Status: AC | PRN
  Administered 2020-07-30: 10 mL/h via INTRAVENOUS

## 2020-07-30 MED ORDER — MIDAZOLAM HCL 2 MG/2ML IJ SOLN
INTRAMUSCULAR | Status: AC | PRN
  Administered 2020-07-30 (×2): 1 mg via INTRAVENOUS

## 2020-07-30 MED ORDER — FENTANYL CITRATE (PF) 100 MCG/2ML IJ SOLN
INTRAMUSCULAR | Status: AC | PRN
  Administered 2020-07-30 (×2): 25 ug via INTRAVENOUS
  Administered 2020-07-30: 50 ug via INTRAVENOUS

## 2020-07-30 NOTE — Procedures (Signed)
Pre procedural Dx: Hypermetabolic right lower lobe pulmonary mass Post procedural Dx: Same  Technically successful CT guided biopsy of hypermetabolic right lower lobe pulmonary mass.   EBL: None.  Complications: None immediate.   Ronny Bacon, MD Pager #: [phone]

## 2020-07-30 NOTE — Discharge Instructions (Addendum)
Lung Biopsy, Care After This information will help you take care of yourself after your procedure. Your health care provider may also give you more specific instructions depending on the type of biopsy you had. If you have problems or questions, contact your health care provider. What can I expect after the procedure? After the procedure, it is common to have:  A cough.  A sore throat.  Pain where a needle or incision was used to collect a biopsy sample (biopsy site). Follow these instructions at home: Medicines  Take over-the-counter and prescription medicines only as told by your health care provider.  Talk to your health care provider before you take any medicines that contain aspirin or NSAIDS, such as ibuprofen. These medicines can increase your risk of bleeding.  Ask your health care provider if the medicine prescribed to you: ? Requires you to avoid driving or using machinery. ? Can cause constipation. You may need to take these actions to prevent or treat constipation:  Drink enough fluid to keep your urine pale yellow.  Take over-the-counter or prescription medicines.  Eat foods that are high in fiber, such as beans, whole grains, fresh fruits and vegetables.  Limit foods that are high in fat and processed sugars, such as fried or sweet foods. Biopsy site care  If you had a needle or open biopsy, follow instructions from your health care provider about how to take care of your biopsy site. Make sure you: ? Wash your hands with soap and water for at least 20 seconds before and after you change your bandage (dressing). If soap and water are not available, use hand sanitizer. ? Change your dressing as told by your health care provider. ? Leave stitches (sutures), skin glue, or adhesive strips in place for as long as you are told. If adhesive strip edges start to loosen and curl up, you may trim the loose edges. Do not remove adhesive strips completely unless your health care  provider tells you to do that.  Do not take baths, swim, or use a hot tub until your health care provider approves. Ask your health care provider if you may take showers. You may only be allowed to take sponge baths.  Check your biopsy site every day for signs of infection. Check for: ? Redness, swelling, or more pain. ? Fluid or blood. ? Warmth. ? Pus or a bad smell.   General instructions  Do not drink alcohol if your health care provider tells you not to drink.  If you were given a sedative during the procedure, it can affect you for several hours. Do not drive or operate machinery until your health care provider says that it is safe.  Return to your normal activities as told by your health care provider. Ask your health care provider what activities are safe for you.  It is up to you to get the results of your procedure. Ask your health care provider, or the department that is doing the procedure, when your results will be ready.  Keep all follow-up visits as told by your health care provider. This is important. Contact a health care provider if:  You have a fever.  You have redness, swelling, or more pain around your biopsy site.  You have fluid or blood coming from your biopsy site.  Your biopsy site feels warm to the touch.  You have pus or a bad smell coming from your biopsy site.  You have pain that does not get better with medicine.  Get help right away if:  You cough up blood.  You have trouble breathing.  You have chest pain.  You lose consciousness. These symptoms may represent a serious problem that is an emergency. Do not wait to see if the symptoms will go away. Get medical help right away. Call your local emergency services (911 in the [place] Do not drive yourself to the hospital. Summary  It is common to have some pain where a needle or incision was used to collect a biopsy sample (biopsy site).  Return to your normal activities as told by your health  care provider. Ask your health care provider what activities are safe for you.  Take over-the-counter and prescription medicines only as told by your health care provider.  Report any unusual symptoms to your health care provider. This information is not intended to replace advice given to you by your health care provider. Make sure you discuss any questions you have with your health care provider. Document Revised: 05/06/2019 Document Reviewed: 05/06/2019 Elsevier Patient Education  2021 [place]. Moderate Conscious Sedation, Adult Sedation is the use of medicines to promote relaxation and to relieve discomfort and anxiety. Moderate conscious sedation is a type of sedation. Under moderate conscious sedation, you are less alert than normal, but you are still able to respond to instructions, touch, or both. Moderate conscious sedation is used during short medical and dental procedures. It is milder than deep sedation, which is a type of sedation under which you cannot be easily woken up. It is also milder than general anesthesia, which is the use of medicines to make you unconscious. Moderate conscious sedation allows you to return to your regular activities sooner. Tell a health care provider about:  Any allergies you have.  All medicines you are taking, including vitamins, herbs, eye drops, creams, and over-the-counter medicines.  Any use of steroids. This includes steroids taken by mouth or as a cream.  Any problems you or family members have had with sedatives and anesthetic medicines.  Any blood disorders you have.  Any surgeries you have had.  Any medical conditions you have, such as sleep apnea.  Whether you are pregnant or may be pregnant.  Any use of cigarettes, alcohol, marijuana, or drugs. What are the risks? Generally, this is a safe procedure. However, problems may occur, including:  Getting too much medicine (oversedation).  Nausea.  Allergic reaction to  medicines.  Trouble breathing. If this happens, a breathing tube may be used. It will be removed when you are awake and breathing on your own.  Heart trouble.  Lung trouble.  Confusion that gets better with time (emergence delirium). What happens before the procedure? Staying hydrated Follow instructions from your health care provider about hydration, which may include:  Up to 2 hours before the procedure - you may continue to drink clear liquids, such as water, clear fruit juice, black coffee, and plain tea. Eating and drinking restrictions Follow instructions from your health care provider about eating and drinking, which may include:  8 hours before the procedure - stop eating heavy meals or foods, such as meat, fried foods, or fatty foods.  6 hours before the procedure - stop eating light meals or foods, such as toast or cereal.  6 hours before the procedure - stop drinking milk or drinks that contain milk.  2 hours before the procedure - stop drinking clear liquids. Medicines Ask your health care provider about:  Changing or stopping your regular medicines. This is especially  important if you are taking diabetes medicines or blood thinners.  Taking medicines such as aspirin and ibuprofen. These medicines can thin your blood. Do not take these medicines unless your health care provider tells you to take them.  Taking over-the-counter medicines, vitamins, herbs, and supplements. Tests and exams  You will have a physical exam.  You may have blood tests done to show how well: ? Your kidneys and liver work. ? Your blood clots. General instructions  Plan to have a responsible adult take you home from the hospital or clinic.  If you will be going home right after the procedure, plan to have a responsible adult care for you for the time you are told. This is important. What happens during the procedure?  You will be given the sedative. The sedative may be given: ? As a  pill that you will swallow. It can also be inserted into the rectum. ? As a spray through the nose. ? As an injection into the muscle. ? As an injection into the vein through an IV.  You may be given oxygen as needed.  Your breathing, heart rate, and blood pressure will be monitored during the procedure.  The medical or dental procedure will be done. The procedure may vary among health care providers and hospitals.   What happens after the procedure?  Your blood pressure, heart rate, breathing rate, and blood oxygen level will be monitored until you leave the hospital or clinic.  You will get fluids through your IV if needed.  Do not drive or operate machinery until your health care provider says that it is safe. Summary  Sedation is the use of medicines to promote relaxation and to relieve discomfort and anxiety. Moderate conscious sedation is a type of sedation that is used during short medical and dental procedures.  Tell the health care provider about any medical conditions that you have and about all the medicines that you are taking.  You will be given the sedative as a pill, a spray through the nose, an injection into the muscle, or an injection into the vein through an IV. Vital signs are monitored during the sedation.  Moderate conscious sedation allows you to return to your regular activities sooner. This information is not intended to replace advice given to you by your health care provider. Make sure you discuss any questions you have with your health care provider. Document Revised: 09/22/2019 Document Reviewed: 04/20/2019 Elsevier Patient Education  2021 [employer].
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
## 2020-07-30 NOTE — Progress Notes (Signed)
Per Jannifer Franklin, okay for pt to eat and DC home. Aware of CXR results.

## 2020-08-05 ENCOUNTER — Other Ambulatory Visit: Payer: Self-pay

## 2020-08-05 ENCOUNTER — Telehealth: Payer: Self-pay | Admitting: *Deleted

## 2020-08-05 ENCOUNTER — Inpatient Hospital Stay (HOSPITAL_BASED_OUTPATIENT_CLINIC_OR_DEPARTMENT_OTHER): Payer: Medicare HMO | Admitting: Oncology

## 2020-08-05 VITALS — BP 143/83 | HR 103 | Temp 97.3°F | Resp 17 | Ht 73.0 in | Wt 220.0 lb

## 2020-08-05 DIAGNOSIS — C3490 Malignant neoplasm of unspecified part of unspecified bronchus or lung: Secondary | ICD-10-CM | POA: Insufficient documentation

## 2020-08-05 DIAGNOSIS — Z7189 Other specified counseling: Secondary | ICD-10-CM | POA: Diagnosis not present

## 2020-08-05 DIAGNOSIS — C3492 Malignant neoplasm of unspecified part of left bronchus or lung: Secondary | ICD-10-CM | POA: Diagnosis not present

## 2020-08-05 DIAGNOSIS — K760 Fatty (change of) liver, not elsewhere classified: Secondary | ICD-10-CM | POA: Diagnosis not present

## 2020-08-05 DIAGNOSIS — C3431 Malignant neoplasm of lower lobe, right bronchus or lung: Secondary | ICD-10-CM | POA: Insufficient documentation

## 2020-08-05 DIAGNOSIS — R918 Other nonspecific abnormal finding of lung field: Secondary | ICD-10-CM | POA: Diagnosis not present

## 2020-08-05 DIAGNOSIS — C884 Extranodal marginal zone B-cell lymphoma of mucosa-associated lymphoid tissue [MALT-lymphoma]: Secondary | ICD-10-CM | POA: Diagnosis not present

## 2020-08-05 MED ORDER — PROCHLORPERAZINE MALEATE 10 MG PO TABS: 10.0000 mg | ORAL_TABLET | Freq: Four times a day (QID) | ORAL | 0 refills | Status: AC | PRN

## 2020-08-05 MED ORDER — HYDROCODONE-HOMATROPINE 5-1.5 MG/5ML PO SYRP
5.0000 mL | ORAL_SOLUTION | Freq: Four times a day (QID) | ORAL | 0 refills | Status: DC | PRN
Start: 2020-08-05 — End: 2020-09-03

## 2020-08-05 NOTE — Telephone Encounter (Signed)
-----   Message from Wyatt Portela, MD sent at 08/05/2020 10:21 AM EST ----- Please contact pathology and request the following:   1. PDL 1 testing on his  lung biopsy on 2/22  2. Foundation one testing on the same biopsy. The diagnosis is Stage IIIA lung cancer with ICD code of C34.9   Thanks

## 2020-08-05 NOTE — Progress Notes (Signed)
Hematology and Oncology Follow Up Visit  Clifford [name] [id number] [birth year] 75 y.o. 08/05/2020 8:55 AM   Principle Diagnosis: 75 year old man with stage IIIa non-small cell lung cancer diagnosed in February 2022.  He presented with 9.0 cm right lower lung mass.  The pathology showed adenosquamous component.  Further testing is currently pending.   Secondary diagnosis: Right lower lung mass noted and February 2022.  Work-up is ongoing.  Prior Therapy: 1. Status post pleurodesis and biopsy of the pleural-based nodule. 2. Received 6 cycles of CVP with rituximab. Therapy concluded in December 2007 after achieving remission at the time.  Current therapy: Under evaluation start therapy.  Interim History:  Clifford [name] is here for a follow-up visit.  Since the last visit, he underwent lung biopsy completed on February 22 without any complications.  He reports feeling reasonably well without any major complaints.  Continues to have issues with cough that is nonproductive.     Medications: Unchanged on review. Current Outpatient Medications  Medication Sig Dispense Refill  . amLODipine (NORVASC) 5 MG tablet Take 5 mg by mouth daily.     Marland Kitchen aspirin 81 MG tablet Take 81 mg by mouth daily.    Marland Kitchen atorvastatin (LIPITOR) 10 MG tablet Take 10 mg by mouth daily.    . benzonatate (TESSALON) 100 MG capsule Take 100-200 mg by mouth 3 (three) times daily as needed for cough.    . cetirizine (ZYRTEC) 10 MG tablet Take 10 mg by mouth daily as needed for allergies.    . hydrochlorothiazide (HYDRODIURIL) 25 MG tablet Take 25 mg by mouth daily.    . traMADol (ULTRAM) 50 MG tablet Take 50 mg by mouth daily as needed for pain.     No current facility-administered medications for this visit.    Allergies: No Known Allergies     Physical Exam:  Blood pressure (!) 143/83, pulse (!) 103, temperature (!) 97.3 F (36.3 C), temperature source Tympanic, resp. rate 17, height 6' 1"  (1.854 m), weight 220  lb (99.8 kg), SpO2 98 %.    ECOG: 0   General appearance: Alert, awake without any distress. Head: Atraumatic without abnormalities Oropharynx: Without any thrush or ulcers. Eyes: No scleral icterus. Lymph nodes: No lymphadenopathy noted in the cervical, supraclavicular, or axillary nodes Heart:regular rate and rhythm, without any murmurs or gallops.   Lung: Clear to auscultation without any rhonchi, wheezes or dullness to percussion. Abdomin: Soft, nontender without any shifting dullness or ascites. Musculoskeletal: No clubbing or cyanosis. Neurological: No motor or sensory deficits. Skin: No rashes or lesions.    Lab Results: Lab Results  Component Value Date   WBC 5.3 07/30/2020   HGB 15.1 07/30/2020   HCT 43.5 07/30/2020   MCV 89.1 07/30/2020   PLT 226 07/30/2020     Chemistry      Component Value Date/Time   NA 140 07/15/2020 0935   NA 144 10/21/2016 0808   K 3.3 (L) 07/15/2020 0935   K 4.0 10/21/2016 0808   CL 101 07/15/2020 0935   CL 109 (H) 10/26/2012 0841   CO2 30 07/15/2020 0935   CO2 29 10/21/2016 0808   BUN 13 07/15/2020 0935   BUN 15.3 10/21/2016 0808   CREATININE 0.99 07/15/2020 0935   CREATININE 1.0 10/21/2016 0808      Component Value Date/Time   CALCIUM 8.9 07/15/2020 0935   CALCIUM 9.3 10/21/2016 0808   ALKPHOS 74 07/15/2020 0935   ALKPHOS 53 10/21/2016 0808   AST 19 07/15/2020  0935   AST 27 10/21/2016 0808   ALT 28 07/15/2020 0935   ALT 37 10/21/2016 0808   BILITOT 1.0 07/15/2020 0935   BILITOT 1.09 10/21/2016 0808     IMPRESSION: 1. Dominant right lower lobe pulmonary mass is markedly hypermetabolic, compatible with neoplasm. No evidence for hypermetabolic metastatic lymphadenopathy in the mediastinum or hilar regions. Yes 2. Multiple high attenuation areas of irregular pleural thickening in the left hemithorax show hypermetabolism. Correlation for prior pleurodesis recommended. 3. 9 mm peripheral left lower lobe pulmonary nodule  shows low level FDG accumulation. Metastatic disease or left lung primary cannot be excluded. 4. Hepatic steatosis. 5.  Aortic Atherosclerois (ICD10-170.0)    Impression and Plan:  75 year old with:   1.  Stage IIIa lung cancer diagnosed in February 2022.  He was found to have right lower lung mass that is biopsy-proven to be adenosquamous histology.    The natural course of this disease was reviewed today and treatment options were discussed.  Definitive therapy with radiation concomitantly with chemotherapy would be recommended at this time.  His tumor does not appear to be resectable and definitive therapy with this approach followed by possible immunotherapy maintenance would be considered.  I will obtain PD-L1 testing as well as next generation sequencing on his tumor but we will initiate treatment.  He is agreeable to proceed.  Chemotherapy will be in the form of carboplatin and paclitaxel weekly for 6 weeks.  Complications include nausea, vomiting, suppression worsening neuropathy.   2.  Low-grade lymphoma diagnosed in 2007 without any evidence of disease relapse.   3.  IV access: Risks and benefits of Port-A-Cath insertion were discussed at this time.  He prefers to use peripheral veins which are adequate at this time.  4.  Antiemetics: Prescription for Compazine will be available to him.  5.  Cough: Prescription for Hycodan will be available to him with instructions how to use it.  6.  Goals of care and prognosis: Therapy remains curative although the likelihood remains low given his large tumor.  7.  Follow-up: We will be in the near future to start therapy.   30  minutes were dedicated to this visit.  The time was spent on updating disease status, discussing treatment options and future plan of care review.   Zola Button, MD 2/28/20228:55 AM

## 2020-08-05 NOTE — Progress Notes (Signed)
START ON PATHWAY REGIMEN - Non-Small Cell Lung     Administer weekly:     Paclitaxel      Carboplatin   **Always confirm dose/schedule in your pharmacy ordering system**  Patient Characteristics: Preoperative or Nonsurgical Candidate (Clinical Staging), Stage III - Nonsurgical Candidate (Nonsquamous and Squamous), PS = 0, 1 Therapeutic Status: Preoperative or Nonsurgical Candidate (Clinical Staging) AJCC T Category: cT4 AJCC N Category: cN0 AJCC M Category: cM0 AJCC 8 Stage Grouping: IIIA ECOG Performance Status: 0 Intent of Therapy: Curative Intent, Discussed with Patient

## 2020-08-05 NOTE — Telephone Encounter (Signed)
Pathology notified of request below

## 2020-08-07 NOTE — Progress Notes (Signed)
Thoracic Location of Tumor / Histology: Malignant neoplasm of Right Lower/Left Lower Lobe Lung- Non small cell  Patient presented to his PCP with recurrent couth.  He denied shortness of breath or difficulty breathing.  He as treated with antibiotics and inhalers without improvement and subsequently a chest xray was ordered.  PET 07/22/2020: Dominant right lower lobe pulmonary mass is markedly hypermetabolic, compatible with neoplasm. No evidence for hypermetabolic metastatic lymphadenopathy in the mediastinum or hilar regions.  Multiple high attenuation areas of irregular pleural thickening in the left hemithorax show hypermetabolism. Correlation for prior pleurodesis recommended. 9 mm peripheral left lower lobe pulmonary nodule shows low level FDG accumulation. Metastatic disease or left lung primary cannot be excluded.  CT Chest 07/11/2020: 9 cm mass in the superior segment of the right lower lobe without any evidence of clear cut adenopathy.  9 mm left lower lobe intermediate pulmonary nodule.  Chest xray 07/09/2020: Right lower lobe mass suspicious for malignancy.  Biopsies of Left Lower Lobe Mass 07/30/2020   Tobacco/Marijuana/Snuff/ETOH use: Former smoker  Past/Anticipated interventions by cardiothoracic surgery, if any:   Past/Anticipated interventions by medical oncology, if any:  Dr. Alen Blew 08/05/2020 -Definitive therapy with radiation concomitantly with chemotherapy would be recommended at this time.  - His tumor does not appear to be resectable and definitive therapy with this approach followed by possible immunotherapy maintenance would be considered.   -I will obtain PD-L1 testing as well as next generation sequencing on his tumor but we will initiate treatment.  He is agreeable to proceed. -Chemotherapy will be in the form of carboplatin and paclitaxel weekly for 6 weeks. -Chemo start 08/27/2020 07/15/2020 -The differential diagnosis of primary lung neoplasm appears to be the most  likely diagnosis although recurrent lymphoma or different malignancy versus benign etiology considered less likely. -obtaining tissue biopsy would be the next step.  CT-guided percutaneous biopsy would be a preferred approach at this time with surgical approach would be preferred as a backup if percutaneous biopsy cannot be obtained. -Treating these findings will be dependent upon the nature of these findings.  This could include systemic chemotherapy alone for small cell lung cancer, combination of chemotherapy and radiation for locally advanced lung cancer versus surgical resection as a part of a trimodality approach after neoadjuvant treatment.  This will be determined after complete his staging as well as tissue biopsy.  He will follow-up after that to discuss management choices  Signs/Symptoms  Weight changes, if any:   Respiratory complaints, if any: No SOB noted.    Hemoptysis, if any: He reports increased cough with changes in activity (sitting to moving around).  He has productive cough with clear phlegm.  No blood noted recently.  Has has previous occasional streaks of blood.  Pain issues, if any:  no  SAFETY ISSUES:  Prior radiation? No  Pacemaker/ICD? No  Possible current pregnancy? No  Is the patient on methotrexate? No  Current Complaints / other details:   Non-hodgkins Lymphoma treatment 1. Status post pleurodesis and biopsy of the pleural-based nodule. 2. Received 6 cycles of CVP with rituximab. Therapy concluded in December 2007 after achieving remission at the time.

## 2020-08-08 ENCOUNTER — Telehealth: Payer: Self-pay | Admitting: Oncology

## 2020-08-08 ENCOUNTER — Other Ambulatory Visit: Payer: Self-pay

## 2020-08-08 ENCOUNTER — Telehealth: Payer: Self-pay | Admitting: *Deleted

## 2020-08-08 ENCOUNTER — Encounter: Payer: Self-pay | Admitting: Radiation Oncology

## 2020-08-08 ENCOUNTER — Ambulatory Visit
Admission: RE | Admit: 2020-08-08 | Discharge: 2020-08-08 | Disposition: A | Discharge status: Home / Self Care | Payer: Medicare HMO | Source: Ambulatory Visit | Attending: Radiation Oncology | Admitting: Radiation Oncology

## 2020-08-08 VITALS — Ht 73.0 in | Wt 220.0 lb

## 2020-08-08 DIAGNOSIS — C3490 Malignant neoplasm of unspecified part of unspecified bronchus or lung: Secondary | ICD-10-CM

## 2020-08-08 DIAGNOSIS — C3431 Malignant neoplasm of lower lobe, right bronchus or lung: Secondary | ICD-10-CM | POA: Diagnosis not present

## 2020-08-08 DIAGNOSIS — Z87891 Personal history of nicotine dependence: Secondary | ICD-10-CM | POA: Diagnosis not present

## 2020-08-08 LAB — SURGICAL PATHOLOGY

## 2020-08-08 NOTE — Telephone Encounter (Signed)
CALLED PATIENT TO INFORM OF MRI FOR 08-16-20- ARRIVAL TIME - 6:30 AM @ [facility], NO RESTRICTIONS TO TEST, SPOKE WITH PATIENT AND HE IS AWARE OF TEST
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
## 2020-08-08 NOTE — Telephone Encounter (Signed)
Scheduled per 03/01 los, patient has been called and notified.

## 2020-08-14 ENCOUNTER — Telehealth: Payer: Self-pay

## 2020-08-14 NOTE — Telephone Encounter (Signed)
Received call from patient expressing concern over a call he received regarding foundation testing and [employer]. Informed patient it was related to the genetic testing ordered by Dr. Alen Blew on 2/28 and that there was need for him to be alarmed. Patient verbalized understanding.

## 2020-08-15 ENCOUNTER — Inpatient Hospital Stay: Payer: Medicare HMO | Attending: Oncology

## 2020-08-15 ENCOUNTER — Ambulatory Visit
Admission: RE | Admit: 2020-08-15 | Discharge: 2020-08-15 | Disposition: A | Discharge status: Home / Self Care | Payer: Medicare HMO | Source: Ambulatory Visit | Attending: Radiation Oncology | Admitting: Radiation Oncology

## 2020-08-15 ENCOUNTER — Encounter (HOSPITAL_COMMUNITY): Payer: Self-pay

## 2020-08-15 ENCOUNTER — Other Ambulatory Visit: Payer: Self-pay

## 2020-08-15 DIAGNOSIS — Z5111 Encounter for antineoplastic chemotherapy: Secondary | ICD-10-CM | POA: Insufficient documentation

## 2020-08-15 DIAGNOSIS — C3431 Malignant neoplasm of lower lobe, right bronchus or lung: Secondary | ICD-10-CM | POA: Diagnosis not present

## 2020-08-15 DIAGNOSIS — Z87891 Personal history of nicotine dependence: Secondary | ICD-10-CM | POA: Diagnosis not present

## 2020-08-15 DIAGNOSIS — Z510 Encounter for antineoplastic radiation therapy: Secondary | ICD-10-CM | POA: Diagnosis not present

## 2020-08-16 ENCOUNTER — Ambulatory Visit (HOSPITAL_COMMUNITY)
Admission: RE | Admit: 2020-08-16 | Discharge: 2020-08-16 | Disposition: A | Discharge status: Home / Self Care | Payer: Medicare HMO | Source: Ambulatory Visit | Attending: Radiation Oncology | Admitting: Radiation Oncology

## 2020-08-16 DIAGNOSIS — C3490 Malignant neoplasm of unspecified part of unspecified bronchus or lung: Secondary | ICD-10-CM | POA: Insufficient documentation

## 2020-08-16 DIAGNOSIS — Q283 Other malformations of cerebral vessels: Secondary | ICD-10-CM | POA: Diagnosis not present

## 2020-08-16 DIAGNOSIS — G9389 Other specified disorders of brain: Secondary | ICD-10-CM | POA: Diagnosis not present

## 2020-08-16 MED ORDER — GADOBUTROL 1 MMOL/ML IV SOLN
10.0000 mL | Freq: Once | INTRAVENOUS | Status: AC | PRN
  Administered 2020-08-16: 10 mL via INTRAVENOUS

## 2020-08-19 ENCOUNTER — Telehealth: Payer: Self-pay | Admitting: Radiation Oncology

## 2020-08-19 NOTE — Telephone Encounter (Signed)
I spoke with the patient to confirm his MRI results. We will plan to start treatment for his lung on Wednesday.

## 2020-08-20 ENCOUNTER — Other Ambulatory Visit: Payer: Self-pay

## 2020-08-20 ENCOUNTER — Inpatient Hospital Stay: Payer: Medicare HMO

## 2020-08-20 VITALS — BP 116/65 | HR 83 | Temp 97.7°F | Resp 18 | Ht 73.0 in | Wt 214.2 lb

## 2020-08-20 DIAGNOSIS — C8590 Non-Hodgkin lymphoma, unspecified, unspecified site: Secondary | ICD-10-CM

## 2020-08-20 DIAGNOSIS — C3431 Malignant neoplasm of lower lobe, right bronchus or lung: Secondary | ICD-10-CM | POA: Diagnosis not present

## 2020-08-20 DIAGNOSIS — Z5111 Encounter for antineoplastic chemotherapy: Secondary | ICD-10-CM | POA: Diagnosis not present

## 2020-08-20 LAB — CMP (CANCER CENTER ONLY)
ALT: 16 U/L (ref 0–44)
AST: 16 U/L (ref 15–41)
Albumin: 3.5 g/dL (ref 3.5–5.0)
Alkaline Phosphatase: 81 U/L (ref 38–126)
Anion gap: 10 (ref 5–15)
BUN: 11 mg/dL (ref 8–23)
CO2: 27 mmol/L (ref 22–32)
Calcium: 9.3 mg/dL (ref 8.9–10.3)
Chloride: 102 mmol/L (ref 98–111)
Creatinine: 0.9 mg/dL (ref 0.61–1.24)
GFR, Estimated: >60 mL/min (ref >60)
Glucose, Bld: 127 mg/dL — ABNORMAL HIGH (ref 70–99)
Potassium: 3 mmol/L — ABNORMAL LOW (ref 3.5–5.1)
Sodium: 139 mmol/L (ref 135–145)
Total Bilirubin: 1 mg/dL (ref 0.3–1.2)
Total Protein: 7.1 g/dL (ref 6.5–8.1)

## 2020-08-20 LAB — CBC WITH DIFFERENTIAL (CANCER CENTER ONLY)
Abs Immature Granulocytes: 0.03 10*3/uL (ref 0.00–0.07)
Basophils Absolute: 0 10*3/uL (ref 0.0–0.1)
Basophils Relative: 1 %
Eosinophils Absolute: 0.1 10*3/uL (ref 0.0–0.5)
Eosinophils Relative: 1 %
HCT: 43.4 % (ref 39.0–52.0)
Hemoglobin: 15.3 g/dL (ref 13.0–17.0)
Immature Granulocytes: 1 %
Lymphocytes Relative: 22 %
Lymphs Abs: 1.4 10*3/uL (ref 0.7–4.0)
MCH: 30.4 pg (ref 26.0–34.0)
MCHC: 35.3 g/dL (ref 30.0–36.0)
MCV: 86.1 fL (ref 80.0–100.0)
Monocytes Absolute: 0.4 10*3/uL (ref 0.1–1.0)
Monocytes Relative: 6 %
Neutro Abs: 4.5 10*3/uL (ref 1.7–7.7)
Neutrophils Relative %: 69 %
Platelet Count: 250 10*3/uL (ref 150–400)
RBC: 5.04 MIL/uL (ref 4.22–5.81)
RDW: 13.4 % (ref 11.5–15.5)
WBC Count: 6.5 10*3/uL (ref 4.0–10.5)
nRBC: 0 % (ref 0.0–0.2)

## 2020-08-20 MED ORDER — SODIUM CHLORIDE 0.9 % IV SOLN
Freq: Once | INTRAVENOUS | Status: AC
  Filled 2020-08-20: qty 250

## 2020-08-20 MED ORDER — SODIUM CHLORIDE 0.9 % IV SOLN
240.0000 mg | Freq: Once | INTRAVENOUS | Status: AC
  Administered 2020-08-20: 240 mg via INTRAVENOUS
  Filled 2020-08-20: qty 24

## 2020-08-20 MED ORDER — PALONOSETRON HCL INJECTION 0.25 MG/5ML
INTRAVENOUS | Status: AC
  Filled 2020-08-20: qty 5

## 2020-08-20 MED ORDER — DIPHENHYDRAMINE HCL 50 MG/ML IJ SOLN
INTRAMUSCULAR | Status: AC
  Filled 2020-08-20: qty 1

## 2020-08-20 MED ORDER — FAMOTIDINE IN NACL 20-0.9 MG/50ML-% IV SOLN
20.0000 mg | Freq: Once | INTRAVENOUS | Status: AC
  Administered 2020-08-20: 20 mg via INTRAVENOUS

## 2020-08-20 MED ORDER — SODIUM CHLORIDE 0.9 % IV SOLN
20.0000 mg | Freq: Once | INTRAVENOUS | Status: AC
  Administered 2020-08-20: 20 mg via INTRAVENOUS
  Filled 2020-08-20: qty 20

## 2020-08-20 MED ORDER — SODIUM CHLORIDE 0.9 % IV SOLN
45.0000 mg/m2 | Freq: Once | INTRAVENOUS | Status: AC
  Administered 2020-08-20: 102 mg via INTRAVENOUS
  Filled 2020-08-20: qty 17

## 2020-08-20 MED ORDER — DIPHENHYDRAMINE HCL 50 MG/ML IJ SOLN
50.0000 mg | Freq: Once | INTRAMUSCULAR | Status: AC
  Administered 2020-08-20: 50 mg via INTRAVENOUS

## 2020-08-20 MED ORDER — PALONOSETRON HCL INJECTION 0.25 MG/5ML
0.2500 mg | Freq: Once | INTRAVENOUS | Status: AC
  Administered 2020-08-20: 0.25 mg via INTRAVENOUS

## 2020-08-20 MED ORDER — FAMOTIDINE IN NACL 20-0.9 MG/50ML-% IV SOLN
INTRAVENOUS | Status: AC
  Filled 2020-08-20: qty 50

## 2020-08-20 NOTE — Patient Instructions (Signed)
[facility] Discharge Instructions for Patients Receiving Chemotherapy  Today you received the following chemotherapy agents Paclitaxel (Taxol) and Carboplatin  To help prevent nausea and vomiting after your treatment, we encourage you to take your nausea medication as directed.    If you develop nausea and vomiting that is not controlled by your nausea medication, call the clinic.   BELOW ARE SYMPTOMS THAT SHOULD BE REPORTED IMMEDIATELY:  *FEVER GREATER THAN 100.5 F  *CHILLS WITH OR WITHOUT FEVER  NAUSEA AND VOMITING THAT IS NOT CONTROLLED WITH YOUR NAUSEA MEDICATION  *UNUSUAL SHORTNESS OF BREATH  *UNUSUAL BRUISING OR BLEEDING  TENDERNESS IN MOUTH AND THROAT WITH OR WITHOUT PRESENCE OF ULCERS  *URINARY PROBLEMS  *BOWEL PROBLEMS  UNUSUAL RASH Items with * indicate a potential emergency and should be followed up as soon as possible.  Feel free to call the clinic should you have any questions or concerns. The clinic phone number is [phone].  Please show the [facility] at check-in to the Emergency Department and triage nurse.  Paclitaxel (Taxol) injection What is this medicine? PACLITAXEL (PAK li TAX el) is a chemotherapy drug. It targets fast dividing cells, like cancer cells, and causes these cells to die. This medicine is used to treat ovarian cancer, breast cancer, lung cancer, Kaposi's sarcoma, and other cancers. This medicine may be used for other purposes; ask your health care provider or pharmacist if you have questions. COMMON BRAND NAME(S): Onxol, Taxol What should I tell my health care provider before I take this medicine? They need to know if you have any of these conditions:  history of irregular heartbeat  liver disease  low blood counts, like low white cell, platelet, or red cell counts  lung or breathing disease, like asthma  tingling of the fingers or toes, or other nerve disorder  an unusual or allergic reaction to  paclitaxel, alcohol, polyoxyethylated castor oil, other chemotherapy, other medicines, foods, dyes, or preservatives  pregnant or trying to get pregnant  breast-feeding How should I use this medicine? This drug is given as an infusion into a vein. It is administered in a hospital or clinic by a specially trained health care professional. Talk to your pediatrician regarding the use of this medicine in children. Special care may be needed. Overdosage: If you think you have taken too much of this medicine contact a poison control center or emergency room at once. NOTE: This medicine is only for you. Do not share this medicine with others. What if I miss a dose? It is important not to miss your dose. Call your doctor or health care professional if you are unable to keep an appointment. What may interact with this medicine? Do not take this medicine with any of the following medications:  live virus vaccines This medicine may also interact with the following medications:  antiviral medicines for hepatitis, HIV or AIDS  certain antibiotics like erythromycin and clarithromycin  certain medicines for fungal infections like ketoconazole and itraconazole  certain medicines for seizures like carbamazepine, phenobarbital, phenytoin  gemfibrozil  nefazodone  rifampin  St. John's wort This list may not describe all possible interactions. Give your health care provider a list of all the medicines, herbs, non-prescription drugs, or dietary supplements you use. Also tell them if you smoke, drink alcohol, or use illegal drugs. Some items may interact with your medicine. What should I watch for while using this medicine? Your condition will be monitored carefully while you are receiving this medicine. You  will need important blood work done while you are taking this medicine. This medicine can cause serious allergic reactions. To reduce your risk you will need to take other medicine(s) before  treatment with this medicine. If you experience allergic reactions like skin rash, itching or hives, swelling of the face, lips, or tongue, tell your doctor or health care professional right away. In some cases, you may be given additional medicines to help with side effects. Follow all directions for their use. This drug may make you feel generally unwell. This is not uncommon, as chemotherapy can affect healthy cells as well as cancer cells. Report any side effects. Continue your course of treatment even though you feel ill unless your doctor tells you to stop. Call your doctor or health care professional for advice if you get a fever, chills or sore throat, or other symptoms of a cold or flu. Do not treat yourself. This drug decreases your body's ability to fight infections. Try to avoid being around people who are sick. This medicine may increase your risk to bruise or bleed. Call your doctor or health care professional if you notice any unusual bleeding. Be careful brushing and flossing your teeth or using a toothpick because you may get an infection or bleed more easily. If you have any dental work done, tell your dentist you are receiving this medicine. Avoid taking products that contain aspirin, acetaminophen, ibuprofen, naproxen, or ketoprofen unless instructed by your doctor. These medicines may hide a fever. Do not become pregnant while taking this medicine. Women should inform their doctor if they wish to become pregnant or think they might be pregnant. There is a potential for serious side effects to an unborn child. Talk to your health care professional or pharmacist for more information. Do not breast-feed an infant while taking this medicine. Men are advised not to father a child while receiving this medicine. This product may contain alcohol. Ask your pharmacist or healthcare provider if this medicine contains alcohol. Be sure to tell all healthcare providers you are taking this medicine.  Certain medicines, like metronidazole and disulfiram, can cause an unpleasant reaction when taken with alcohol. The reaction includes flushing, headache, nausea, vomiting, sweating, and increased thirst. The reaction can last from 30 minutes to several hours. What side effects may I notice from receiving this medicine? Side effects that you should report to your doctor or health care professional as soon as possible:  allergic reactions like skin rash, itching or hives, swelling of the face, lips, or tongue  breathing problems  changes in vision  fast, irregular heartbeat  high or low blood pressure  mouth sores  pain, tingling, numbness in the hands or feet  signs of decreased platelets or bleeding - bruising, pinpoint red spots on the skin, black, tarry stools, blood in the urine  signs of decreased red blood cells - unusually weak or tired, feeling faint or lightheaded, falls  signs of infection - fever or chills, cough, sore throat, pain or difficulty passing urine  signs and symptoms of liver injury like dark yellow or brown urine; general ill feeling or flu-like symptoms; light-colored stools; loss of appetite; nausea; right upper belly pain; unusually weak or tired; yellowing of the eyes or skin  swelling of the ankles, feet, hands  unusually slow heartbeat Side effects that usually do not require medical attention (report to your doctor or health care professional if they continue or are bothersome):  diarrhea  hair loss  loss of appetite  muscle or joint pain  nausea, vomiting  pain, redness, or irritation at site where injected  tiredness This list may not describe all possible side effects. Call your doctor for medical advice about side effects. You may report side effects to FDA at 1-800-FDA-1088. Where should I keep my medicine? This drug is given in a hospital or clinic and will not be stored at home. NOTE: This sheet is a summary. It may not cover all  possible information. If you have questions about this medicine, talk to your doctor, pharmacist, or health care provider.  2021 Elsevier/Gold Standard (2019-04-26 13:37:23)  Carboplatin injection What is this medicine? CARBOPLATIN (KAR boe pla tin) is a chemotherapy drug. It targets fast dividing cells, like cancer cells, and causes these cells to die. This medicine is used to treat ovarian cancer and many other cancers. This medicine may be used for other purposes; ask your health care provider or pharmacist if you have questions. COMMON BRAND NAME(S): Paraplatin What should I tell my health care provider before I take this medicine? They need to know if you have any of these conditions:  blood disorders  hearing problems  kidney disease  recent or ongoing radiation therapy  an unusual or allergic reaction to carboplatin, cisplatin, other chemotherapy, other medicines, foods, dyes, or preservatives  pregnant or trying to get pregnant  breast-feeding How should I use this medicine? This drug is usually given as an infusion into a vein. It is administered in a hospital or clinic by a specially trained health care professional. Talk to your pediatrician regarding the use of this medicine in children. Special care may be needed. Overdosage: If you think you have taken too much of this medicine contact a poison control center or emergency room at once. NOTE: This medicine is only for you. Do not share this medicine with others. What if I miss a dose? It is important not to miss a dose. Call your doctor or health care professional if you are unable to keep an appointment. What may interact with this medicine?  medicines for seizures  medicines to increase blood counts like filgrastim, pegfilgrastim, sargramostim  some antibiotics like amikacin, gentamicin, neomycin, streptomycin, tobramycin  vaccines Talk to your doctor or health care professional before taking any of these  medicines:  acetaminophen  aspirin  ibuprofen  ketoprofen  naproxen This list may not describe all possible interactions. Give your health care provider a list of all the medicines, herbs, non-prescription drugs, or dietary supplements you use. Also tell them if you smoke, drink alcohol, or use illegal drugs. Some items may interact with your medicine. What should I watch for while using this medicine? Your condition will be monitored carefully while you are receiving this medicine. You will need important blood work done while you are taking this medicine. This drug may make you feel generally unwell. This is not uncommon, as chemotherapy can affect healthy cells as well as cancer cells. Report any side effects. Continue your course of treatment even though you feel ill unless your doctor tells you to stop. In some cases, you may be given additional medicines to help with side effects. Follow all directions for their use. Call your doctor or health care professional for advice if you get a fever, chills or sore throat, or other symptoms of a cold or flu. Do not treat yourself. This drug decreases your body's ability to fight infections. Try to avoid being around people who are sick. This medicine may increase your  risk to bruise or bleed. Call your doctor or health care professional if you notice any unusual bleeding. Be careful brushing and flossing your teeth or using a toothpick because you may get an infection or bleed more easily. If you have any dental work done, tell your dentist you are receiving this medicine. Avoid taking products that contain aspirin, acetaminophen, ibuprofen, naproxen, or ketoprofen unless instructed by your doctor. These medicines may hide a fever. Do not become pregnant while taking this medicine. Women should inform their doctor if they wish to become pregnant or think they might be pregnant. There is a potential for serious side effects to an unborn child. Talk  to your health care professional or pharmacist for more information. Do not breast-feed an infant while taking this medicine. What side effects may I notice from receiving this medicine? Side effects that you should report to your doctor or health care professional as soon as possible:  allergic reactions like skin rash, itching or hives, swelling of the face, lips, or tongue  signs of infection - fever or chills, cough, sore throat, pain or difficulty passing urine  signs of decreased platelets or bleeding - bruising, pinpoint red spots on the skin, black, tarry stools, nosebleeds  signs of decreased red blood cells - unusually weak or tired, fainting spells, lightheadedness  breathing problems  changes in hearing  changes in vision  chest pain  high blood pressure  low blood counts - This drug may decrease the number of white blood cells, red blood cells and platelets. You may be at increased risk for infections and bleeding.  nausea and vomiting  pain, swelling, redness or irritation at the injection site  pain, tingling, numbness in the hands or feet  problems with balance, talking, walking  trouble passing urine or change in the amount of urine Side effects that usually do not require medical attention (report to your doctor or health care professional if they continue or are bothersome):  hair loss  loss of appetite  metallic taste in the mouth or changes in taste This list may not describe all possible side effects. Call your doctor for medical advice about side effects. You may report side effects to FDA at 1-800-FDA-1088. Where should I keep my medicine? This drug is given in a hospital or clinic and will not be stored at home. NOTE: This sheet is a summary. It may not cover all possible information. If you have questions about this medicine, talk to your doctor, pharmacist, or health care provider.  2021 Elsevier/Gold Standard (2007-08-30 14:38:05)

## 2020-08-21 ENCOUNTER — Telehealth: Payer: Self-pay | Admitting: *Deleted

## 2020-08-21 ENCOUNTER — Ambulatory Visit
Admission: RE | Admit: 2020-08-21 | Discharge: 2020-08-21 | Disposition: A | Discharge status: Home / Self Care | Payer: Medicare HMO | Source: Ambulatory Visit | Attending: Radiation Oncology | Admitting: Radiation Oncology

## 2020-08-21 DIAGNOSIS — Z87891 Personal history of nicotine dependence: Secondary | ICD-10-CM | POA: Diagnosis not present

## 2020-08-21 DIAGNOSIS — C3431 Malignant neoplasm of lower lobe, right bronchus or lung: Secondary | ICD-10-CM | POA: Diagnosis not present

## 2020-08-21 DIAGNOSIS — Z510 Encounter for antineoplastic radiation therapy: Secondary | ICD-10-CM | POA: Diagnosis not present

## 2020-08-22 ENCOUNTER — Other Ambulatory Visit: Payer: Self-pay

## 2020-08-22 ENCOUNTER — Ambulatory Visit
Admission: RE | Admit: 2020-08-22 | Discharge: 2020-08-22 | Disposition: A | Discharge status: Home / Self Care | Payer: Medicare HMO | Source: Ambulatory Visit | Attending: Radiation Oncology | Admitting: Radiation Oncology

## 2020-08-22 DIAGNOSIS — C3432 Malignant neoplasm of lower lobe, left bronchus or lung: Secondary | ICD-10-CM | POA: Diagnosis not present

## 2020-08-22 DIAGNOSIS — Z87891 Personal history of nicotine dependence: Secondary | ICD-10-CM | POA: Diagnosis not present

## 2020-08-22 DIAGNOSIS — Z510 Encounter for antineoplastic radiation therapy: Secondary | ICD-10-CM | POA: Diagnosis not present

## 2020-08-22 DIAGNOSIS — C3431 Malignant neoplasm of lower lobe, right bronchus or lung: Secondary | ICD-10-CM | POA: Diagnosis not present

## 2020-08-22 NOTE — Progress Notes (Signed)
Pt here for patient teaching.  Pt given Radiation and You booklet, skin care instructions and Sonafine.  Reviewed areas of pertinence such as fatigue, hair loss, skin changes, throat changes, cough and shortness of breath . Pt able to give teach back of to pat skin and use unscented/gentle soap,apply Sonafine bid and avoid applying anything to skin within 4 hours of treatment. Pt verbalizes understanding of information given and will contact nursing with any questions or concerns.     Clifford [name]. Leonie Green, BSN

## 2020-08-23 ENCOUNTER — Ambulatory Visit
Admission: RE | Admit: 2020-08-23 | Discharge: 2020-08-23 | Disposition: A | Discharge status: Home / Self Care | Payer: Medicare HMO | Source: Ambulatory Visit | Attending: Radiation Oncology | Admitting: Radiation Oncology

## 2020-08-23 DIAGNOSIS — C3431 Malignant neoplasm of lower lobe, right bronchus or lung: Secondary | ICD-10-CM

## 2020-08-23 DIAGNOSIS — Z87891 Personal history of nicotine dependence: Secondary | ICD-10-CM | POA: Diagnosis not present

## 2020-08-23 DIAGNOSIS — Z510 Encounter for antineoplastic radiation therapy: Secondary | ICD-10-CM | POA: Diagnosis not present

## 2020-08-23 MED ORDER — SONAFINE EX EMUL
1.0000 "application " | Freq: Once | CUTANEOUS | Status: AC
  Administered 2020-08-23: 1 via TOPICAL

## 2020-08-26 ENCOUNTER — Other Ambulatory Visit: Payer: Self-pay

## 2020-08-26 ENCOUNTER — Ambulatory Visit
Admission: RE | Admit: 2020-08-26 | Discharge: 2020-08-26 | Disposition: A | Discharge status: Home / Self Care | Payer: Medicare HMO | Source: Ambulatory Visit | Attending: Radiation Oncology | Admitting: Radiation Oncology

## 2020-08-26 DIAGNOSIS — C3431 Malignant neoplasm of lower lobe, right bronchus or lung: Secondary | ICD-10-CM | POA: Diagnosis not present

## 2020-08-26 DIAGNOSIS — Z510 Encounter for antineoplastic radiation therapy: Secondary | ICD-10-CM | POA: Diagnosis not present

## 2020-08-26 DIAGNOSIS — Z87891 Personal history of nicotine dependence: Secondary | ICD-10-CM | POA: Diagnosis not present

## 2020-08-27 ENCOUNTER — Inpatient Hospital Stay: Payer: Medicare HMO

## 2020-08-27 ENCOUNTER — Ambulatory Visit
Admission: RE | Admit: 2020-08-27 | Discharge: 2020-08-27 | Disposition: A | Discharge status: Home / Self Care | Payer: Medicare HMO | Source: Ambulatory Visit | Attending: Radiation Oncology | Admitting: Radiation Oncology

## 2020-08-27 ENCOUNTER — Other Ambulatory Visit: Payer: Self-pay

## 2020-08-27 ENCOUNTER — Encounter (HOSPITAL_COMMUNITY): Payer: Self-pay

## 2020-08-27 VITALS — BP 154/76 | HR 93 | Temp 98.0°F | Resp 18 | Wt 212.1 lb

## 2020-08-27 DIAGNOSIS — Z510 Encounter for antineoplastic radiation therapy: Secondary | ICD-10-CM | POA: Diagnosis not present

## 2020-08-27 DIAGNOSIS — C3431 Malignant neoplasm of lower lobe, right bronchus or lung: Secondary | ICD-10-CM | POA: Diagnosis not present

## 2020-08-27 DIAGNOSIS — Z87891 Personal history of nicotine dependence: Secondary | ICD-10-CM | POA: Diagnosis not present

## 2020-08-27 DIAGNOSIS — Z5111 Encounter for antineoplastic chemotherapy: Secondary | ICD-10-CM | POA: Diagnosis not present

## 2020-08-27 DIAGNOSIS — C3492 Malignant neoplasm of unspecified part of left bronchus or lung: Secondary | ICD-10-CM

## 2020-08-27 LAB — CMP (CANCER CENTER ONLY)
ALT: 18 U/L (ref 0–44)
AST: 19 U/L (ref 15–41)
Albumin: 3.6 g/dL (ref 3.5–5.0)
Alkaline Phosphatase: 68 U/L (ref 38–126)
Anion gap: 10 (ref 5–15)
BUN: 13 mg/dL (ref 8–23)
CO2: 27 mmol/L (ref 22–32)
Calcium: 9.4 mg/dL (ref 8.9–10.3)
Chloride: 100 mmol/L (ref 98–111)
Creatinine: 0.91 mg/dL (ref 0.61–1.24)
GFR, Estimated: >60 mL/min (ref >60)
Glucose, Bld: 105 mg/dL — ABNORMAL HIGH (ref 70–99)
Potassium: 3.4 mmol/L — ABNORMAL LOW (ref 3.5–5.1)
Sodium: 137 mmol/L (ref 135–145)
Total Bilirubin: 1.2 mg/dL (ref 0.3–1.2)
Total Protein: 7.1 g/dL (ref 6.5–8.1)

## 2020-08-27 LAB — CBC WITH DIFFERENTIAL (CANCER CENTER ONLY)
Abs Immature Granulocytes: 0.04 10*3/uL (ref 0.00–0.07)
Basophils Absolute: 0 10*3/uL (ref 0.0–0.1)
Basophils Relative: 1 %
Eosinophils Absolute: 0.1 10*3/uL (ref 0.0–0.5)
Eosinophils Relative: 1 %
HCT: 42.6 % (ref 39.0–52.0)
Hemoglobin: 15 g/dL (ref 13.0–17.0)
Immature Granulocytes: 1 %
Lymphocytes Relative: 17 %
Lymphs Abs: 1 10*3/uL (ref 0.7–4.0)
MCH: 31 pg (ref 26.0–34.0)
MCHC: 35.2 g/dL (ref 30.0–36.0)
MCV: 88 fL (ref 80.0–100.0)
Monocytes Absolute: 0.4 10*3/uL (ref 0.1–1.0)
Monocytes Relative: 6 %
Neutro Abs: 4.4 10*3/uL (ref 1.7–7.7)
Neutrophils Relative %: 74 %
Platelet Count: 242 10*3/uL (ref 150–400)
RBC: 4.84 MIL/uL (ref 4.22–5.81)
RDW: 12.9 % (ref 11.5–15.5)
WBC Count: 5.9 10*3/uL (ref 4.0–10.5)
nRBC: 0 % (ref 0.0–0.2)

## 2020-08-27 MED ORDER — SODIUM CHLORIDE 0.9% FLUSH
10.0000 mL | INTRAVENOUS | Status: DC | PRN
  Filled 2020-08-27: qty 10

## 2020-08-27 MED ORDER — DIPHENHYDRAMINE HCL 50 MG/ML IJ SOLN
50.0000 mg | Freq: Once | INTRAMUSCULAR | Status: AC
  Administered 2020-08-27: 50 mg via INTRAVENOUS

## 2020-08-27 MED ORDER — SODIUM CHLORIDE 0.9 % IV SOLN
45.0000 mg/m2 | Freq: Once | INTRAVENOUS | Status: AC
  Administered 2020-08-27: 102 mg via INTRAVENOUS
  Filled 2020-08-27: qty 17

## 2020-08-27 MED ORDER — SODIUM CHLORIDE 0.9 % IV SOLN
20.0000 mg | Freq: Once | INTRAVENOUS | Status: AC
  Administered 2020-08-27: 20 mg via INTRAVENOUS
  Filled 2020-08-27: qty 20

## 2020-08-27 MED ORDER — FAMOTIDINE IN NACL 20-0.9 MG/50ML-% IV SOLN
20.0000 mg | Freq: Once | INTRAVENOUS | Status: AC
  Administered 2020-08-27: 20 mg via INTRAVENOUS

## 2020-08-27 MED ORDER — SODIUM CHLORIDE 0.9 % IV SOLN
Freq: Once | INTRAVENOUS | Status: AC
  Filled 2020-08-27: qty 250

## 2020-08-27 MED ORDER — SODIUM CHLORIDE 0.9 % IV SOLN
240.0000 mg | Freq: Once | INTRAVENOUS | Status: AC
  Administered 2020-08-27: 240 mg via INTRAVENOUS
  Filled 2020-08-27: qty 24

## 2020-08-27 MED ORDER — PALONOSETRON HCL INJECTION 0.25 MG/5ML
0.2500 mg | Freq: Once | INTRAVENOUS | Status: AC
  Administered 2020-08-27: 0.25 mg via INTRAVENOUS

## 2020-08-27 NOTE — Patient Instructions (Signed)
[facility] Discharge Instructions for Patients Receiving Chemotherapy  Today you received the following chemotherapy agents Paclitaxel(Taxol), Carboplatin.  To help prevent nausea and vomiting after your treatment, we encourage you to take your nausea medication as directed.   If you develop nausea and vomiting that is not controlled by your nausea medication, call the clinic.   BELOW ARE SYMPTOMS THAT SHOULD BE REPORTED IMMEDIATELY:  *FEVER GREATER THAN 100.5 F  *CHILLS WITH OR WITHOUT FEVER  NAUSEA AND VOMITING THAT IS NOT CONTROLLED WITH YOUR NAUSEA MEDICATION  *UNUSUAL SHORTNESS OF BREATH  *UNUSUAL BRUISING OR BLEEDING  TENDERNESS IN MOUTH AND THROAT WITH OR WITHOUT PRESENCE OF ULCERS  *URINARY PROBLEMS  *BOWEL PROBLEMS  UNUSUAL RASH Items with * indicate a potential emergency and should be followed up as soon as possible.  Feel free to call the clinic should you have any questions or concerns. The clinic phone number is [phone].  Please show the [facility] at check-in to the Emergency Department and triage nurse.

## 2020-08-28 ENCOUNTER — Ambulatory Visit
Admission: RE | Admit: 2020-08-28 | Discharge: 2020-08-28 | Disposition: A | Discharge status: Home / Self Care | Payer: Medicare HMO | Source: Ambulatory Visit | Attending: Radiation Oncology | Admitting: Radiation Oncology

## 2020-08-28 DIAGNOSIS — C3431 Malignant neoplasm of lower lobe, right bronchus or lung: Secondary | ICD-10-CM | POA: Diagnosis not present

## 2020-08-28 DIAGNOSIS — Z87891 Personal history of nicotine dependence: Secondary | ICD-10-CM | POA: Diagnosis not present

## 2020-08-28 DIAGNOSIS — Z510 Encounter for antineoplastic radiation therapy: Secondary | ICD-10-CM | POA: Diagnosis not present

## 2020-08-29 ENCOUNTER — Ambulatory Visit
Admission: RE | Admit: 2020-08-29 | Discharge: 2020-08-29 | Disposition: A | Discharge status: Home / Self Care | Payer: Medicare HMO | Source: Ambulatory Visit | Attending: Radiation Oncology | Admitting: Radiation Oncology

## 2020-08-29 DIAGNOSIS — C3431 Malignant neoplasm of lower lobe, right bronchus or lung: Secondary | ICD-10-CM | POA: Diagnosis not present

## 2020-08-29 DIAGNOSIS — Z87891 Personal history of nicotine dependence: Secondary | ICD-10-CM | POA: Diagnosis not present

## 2020-08-29 DIAGNOSIS — Z510 Encounter for antineoplastic radiation therapy: Secondary | ICD-10-CM | POA: Diagnosis not present

## 2020-08-30 ENCOUNTER — Ambulatory Visit
Admission: RE | Admit: 2020-08-30 | Discharge: 2020-08-30 | Disposition: A | Discharge status: Home / Self Care | Payer: Medicare HMO | Source: Ambulatory Visit | Attending: Radiation Oncology | Admitting: Radiation Oncology

## 2020-08-30 DIAGNOSIS — Z510 Encounter for antineoplastic radiation therapy: Secondary | ICD-10-CM | POA: Diagnosis not present

## 2020-08-30 DIAGNOSIS — Z87891 Personal history of nicotine dependence: Secondary | ICD-10-CM | POA: Diagnosis not present

## 2020-08-30 DIAGNOSIS — C3431 Malignant neoplasm of lower lobe, right bronchus or lung: Secondary | ICD-10-CM | POA: Diagnosis not present

## 2020-09-02 ENCOUNTER — Ambulatory Visit
Admission: RE | Admit: 2020-09-02 | Discharge: 2020-09-02 | Disposition: A | Discharge status: Home / Self Care | Payer: Medicare HMO | Source: Ambulatory Visit | Attending: Radiation Oncology | Admitting: Radiation Oncology

## 2020-09-02 ENCOUNTER — Other Ambulatory Visit: Payer: Self-pay | Admitting: Oncology

## 2020-09-02 ENCOUNTER — Other Ambulatory Visit: Payer: Self-pay

## 2020-09-02 DIAGNOSIS — Z87891 Personal history of nicotine dependence: Secondary | ICD-10-CM | POA: Diagnosis not present

## 2020-09-02 DIAGNOSIS — C3431 Malignant neoplasm of lower lobe, right bronchus or lung: Secondary | ICD-10-CM | POA: Diagnosis not present

## 2020-09-02 DIAGNOSIS — Z510 Encounter for antineoplastic radiation therapy: Secondary | ICD-10-CM | POA: Diagnosis not present

## 2020-09-03 ENCOUNTER — Inpatient Hospital Stay: Payer: Medicare HMO

## 2020-09-03 ENCOUNTER — Ambulatory Visit
Admission: RE | Admit: 2020-09-03 | Discharge: 2020-09-03 | Disposition: A | Discharge status: Home / Self Care | Payer: Medicare HMO | Source: Ambulatory Visit | Attending: Radiation Oncology | Admitting: Radiation Oncology

## 2020-09-03 ENCOUNTER — Other Ambulatory Visit: Payer: Self-pay | Admitting: Oncology

## 2020-09-03 VITALS — BP 145/67 | HR 93 | Temp 98.3°F | Resp 18 | Wt 212.0 lb

## 2020-09-03 DIAGNOSIS — Z510 Encounter for antineoplastic radiation therapy: Secondary | ICD-10-CM | POA: Diagnosis not present

## 2020-09-03 DIAGNOSIS — Z87891 Personal history of nicotine dependence: Secondary | ICD-10-CM | POA: Diagnosis not present

## 2020-09-03 DIAGNOSIS — C3492 Malignant neoplasm of unspecified part of left bronchus or lung: Secondary | ICD-10-CM

## 2020-09-03 DIAGNOSIS — C3431 Malignant neoplasm of lower lobe, right bronchus or lung: Secondary | ICD-10-CM | POA: Diagnosis not present

## 2020-09-03 DIAGNOSIS — Z5111 Encounter for antineoplastic chemotherapy: Secondary | ICD-10-CM | POA: Diagnosis not present

## 2020-09-03 LAB — CMP (CANCER CENTER ONLY)
ALT: 19 U/L (ref 0–44)
AST: 18 U/L (ref 15–41)
Albumin: 3.6 g/dL (ref 3.5–5.0)
Alkaline Phosphatase: 70 U/L (ref 38–126)
Anion gap: 12 (ref 5–15)
BUN: 11 mg/dL (ref 8–23)
CO2: 25 mmol/L (ref 22–32)
Calcium: 9.1 mg/dL (ref 8.9–10.3)
Chloride: 100 mmol/L (ref 98–111)
Creatinine: 0.82 mg/dL (ref 0.61–1.24)
GFR, Estimated: >60 mL/min (ref >60)
Glucose, Bld: 94 mg/dL (ref 70–99)
Potassium: 3.3 mmol/L — ABNORMAL LOW (ref 3.5–5.1)
Sodium: 137 mmol/L (ref 135–145)
Total Bilirubin: 1.1 mg/dL (ref 0.3–1.2)
Total Protein: 7.1 g/dL (ref 6.5–8.1)

## 2020-09-03 LAB — CBC WITH DIFFERENTIAL (CANCER CENTER ONLY)
Abs Immature Granulocytes: 0.02 10*3/uL (ref 0.00–0.07)
Basophils Absolute: 0 10*3/uL (ref 0.0–0.1)
Basophils Relative: 1 %
Eosinophils Absolute: 0.1 10*3/uL (ref 0.0–0.5)
Eosinophils Relative: 1 %
HCT: 40.2 % (ref 39.0–52.0)
Hemoglobin: 14.1 g/dL (ref 13.0–17.0)
Immature Granulocytes: 1 %
Lymphocytes Relative: 15 %
Lymphs Abs: 0.6 10*3/uL — ABNORMAL LOW (ref 0.7–4.0)
MCH: 30.7 pg (ref 26.0–34.0)
MCHC: 35.1 g/dL (ref 30.0–36.0)
MCV: 87.6 fL (ref 80.0–100.0)
Monocytes Absolute: 0.3 10*3/uL (ref 0.1–1.0)
Monocytes Relative: 8 %
Neutro Abs: 2.8 10*3/uL (ref 1.7–7.7)
Neutrophils Relative %: 74 %
Platelet Count: 233 10*3/uL (ref 150–400)
RBC: 4.59 MIL/uL (ref 4.22–5.81)
RDW: 13 % (ref 11.5–15.5)
WBC Count: 3.7 10*3/uL — ABNORMAL LOW (ref 4.0–10.5)
nRBC: 0 % (ref 0.0–0.2)

## 2020-09-03 MED ORDER — DIPHENHYDRAMINE HCL 50 MG/ML IJ SOLN
50.0000 mg | Freq: Once | INTRAMUSCULAR | Status: AC
  Administered 2020-09-03: 50 mg via INTRAVENOUS

## 2020-09-03 MED ORDER — DEXAMETHASONE SODIUM PHOSPHATE 100 MG/10ML IJ SOLN
20.0000 mg | Freq: Once | INTRAMUSCULAR | Status: AC
  Administered 2020-09-03: 20 mg via INTRAVENOUS
  Filled 2020-09-03: qty 20

## 2020-09-03 MED ORDER — FAMOTIDINE IN NACL 20-0.9 MG/50ML-% IV SOLN
INTRAVENOUS | Status: AC
  Filled 2020-09-03: qty 50

## 2020-09-03 MED ORDER — FAMOTIDINE IN NACL 20-0.9 MG/50ML-% IV SOLN
20.0000 mg | Freq: Once | INTRAVENOUS | Status: AC
  Administered 2020-09-03: 20 mg via INTRAVENOUS

## 2020-09-03 MED ORDER — DIPHENHYDRAMINE HCL 50 MG/ML IJ SOLN
INTRAMUSCULAR | Status: AC
  Filled 2020-09-03: qty 1

## 2020-09-03 MED ORDER — SODIUM CHLORIDE 0.9 % IV SOLN
240.0000 mg | Freq: Once | INTRAVENOUS | Status: AC
  Administered 2020-09-03: 240 mg via INTRAVENOUS
  Filled 2020-09-03: qty 24

## 2020-09-03 MED ORDER — PALONOSETRON HCL INJECTION 0.25 MG/5ML
0.2500 mg | Freq: Once | INTRAVENOUS | Status: AC
  Administered 2020-09-03: 0.25 mg via INTRAVENOUS

## 2020-09-03 MED ORDER — HYDROCODONE-HOMATROPINE 5-1.5 MG/5ML PO SYRP: 5.0000 mL | ORAL_SOLUTION | Freq: Four times a day (QID) | ORAL | 0 refills | Status: DC | PRN

## 2020-09-03 MED ORDER — SODIUM CHLORIDE 0.9 % IV SOLN
45.0000 mg/m2 | Freq: Once | INTRAVENOUS | Status: AC
  Administered 2020-09-03: 102 mg via INTRAVENOUS
  Filled 2020-09-03: qty 17

## 2020-09-03 MED ORDER — PALONOSETRON HCL INJECTION 0.25 MG/5ML
INTRAVENOUS | Status: AC
  Filled 2020-09-03: qty 5

## 2020-09-03 MED ORDER — SODIUM CHLORIDE 0.9 % IV SOLN
Freq: Once | INTRAVENOUS | Status: AC
  Filled 2020-09-03: qty 250

## 2020-09-03 NOTE — Patient Instructions (Signed)
[facility] Discharge Instructions for Patients Receiving Chemotherapy  Today you received the following chemotherapy agents Paclitaxel (Taxol) and Carboplatin  To help prevent nausea and vomiting after your treatment, we encourage you to take your nausea medication as directed.    If you develop nausea and vomiting that is not controlled by your nausea medication, call the clinic.   BELOW ARE SYMPTOMS THAT SHOULD BE REPORTED IMMEDIATELY:  *FEVER GREATER THAN 100.5 F  *CHILLS WITH OR WITHOUT FEVER  NAUSEA AND VOMITING THAT IS NOT CONTROLLED WITH YOUR NAUSEA MEDICATION  *UNUSUAL SHORTNESS OF BREATH  *UNUSUAL BRUISING OR BLEEDING  TENDERNESS IN MOUTH AND THROAT WITH OR WITHOUT PRESENCE OF ULCERS  *URINARY PROBLEMS  *BOWEL PROBLEMS  UNUSUAL RASH Items with * indicate a potential emergency and should be followed up as soon as possible.  Feel free to call the clinic should you have any questions or concerns. The clinic phone number is [phone].  Please show the [facility] at check-in to the Emergency Department and triage nurse.  Paclitaxel (Taxol) injection What is this medicine? PACLITAXEL (PAK li TAX el) is a chemotherapy drug. It targets fast dividing cells, like cancer cells, and causes these cells to die. This medicine is used to treat ovarian cancer, breast cancer, lung cancer, Kaposi's sarcoma, and other cancers. This medicine may be used for other purposes; ask your health care provider or pharmacist if you have questions. COMMON BRAND NAME(S): Onxol, Taxol What should I tell my health care provider before I take this medicine? They need to know if you have any of these conditions:  history of irregular heartbeat  liver disease  low blood counts, like low white cell, platelet, or red cell counts  lung or breathing disease, like asthma  tingling of the fingers or toes, or other nerve disorder  an unusual or allergic reaction to  paclitaxel, alcohol, polyoxyethylated castor oil, other chemotherapy, other medicines, foods, dyes, or preservatives  pregnant or trying to get pregnant  breast-feeding How should I use this medicine? This drug is given as an infusion into a vein. It is administered in a hospital or clinic by a specially trained health care professional. Talk to your pediatrician regarding the use of this medicine in children. Special care may be needed. Overdosage: If you think you have taken too much of this medicine contact a poison control center or emergency room at once. NOTE: This medicine is only for you. Do not share this medicine with others. What if I miss a dose? It is important not to miss your dose. Call your doctor or health care professional if you are unable to keep an appointment. What may interact with this medicine? Do not take this medicine with any of the following medications:  live virus vaccines This medicine may also interact with the following medications:  antiviral medicines for hepatitis, HIV or AIDS  certain antibiotics like erythromycin and clarithromycin  certain medicines for fungal infections like ketoconazole and itraconazole  certain medicines for seizures like carbamazepine, phenobarbital, phenytoin  gemfibrozil  nefazodone  rifampin  St. John's wort This list may not describe all possible interactions. Give your health care provider a list of all the medicines, herbs, non-prescription drugs, or dietary supplements you use. Also tell them if you smoke, drink alcohol, or use illegal drugs. Some items may interact with your medicine. What should I watch for while using this medicine? Your condition will be monitored carefully while you are receiving this medicine. You  will need important blood work done while you are taking this medicine. This medicine can cause serious allergic reactions. To reduce your risk you will need to take other medicine(s) before  treatment with this medicine. If you experience allergic reactions like skin rash, itching or hives, swelling of the face, lips, or tongue, tell your doctor or health care professional right away. In some cases, you may be given additional medicines to help with side effects. Follow all directions for their use. This drug may make you feel generally unwell. This is not uncommon, as chemotherapy can affect healthy cells as well as cancer cells. Report any side effects. Continue your course of treatment even though you feel ill unless your doctor tells you to stop. Call your doctor or health care professional for advice if you get a fever, chills or sore throat, or other symptoms of a cold or flu. Do not treat yourself. This drug decreases your body's ability to fight infections. Try to avoid being around people who are sick. This medicine may increase your risk to bruise or bleed. Call your doctor or health care professional if you notice any unusual bleeding. Be careful brushing and flossing your teeth or using a toothpick because you may get an infection or bleed more easily. If you have any dental work done, tell your dentist you are receiving this medicine. Avoid taking products that contain aspirin, acetaminophen, ibuprofen, naproxen, or ketoprofen unless instructed by your doctor. These medicines may hide a fever. Do not become pregnant while taking this medicine. Women should inform their doctor if they wish to become pregnant or think they might be pregnant. There is a potential for serious side effects to an unborn child. Talk to your health care professional or pharmacist for more information. Do not breast-feed an infant while taking this medicine. Men are advised not to father a child while receiving this medicine. This product may contain alcohol. Ask your pharmacist or healthcare provider if this medicine contains alcohol. Be sure to tell all healthcare providers you are taking this medicine.  Certain medicines, like metronidazole and disulfiram, can cause an unpleasant reaction when taken with alcohol. The reaction includes flushing, headache, nausea, vomiting, sweating, and increased thirst. The reaction can last from 30 minutes to several hours. What side effects may I notice from receiving this medicine? Side effects that you should report to your doctor or health care professional as soon as possible:  allergic reactions like skin rash, itching or hives, swelling of the face, lips, or tongue  breathing problems  changes in vision  fast, irregular heartbeat  high or low blood pressure  mouth sores  pain, tingling, numbness in the hands or feet  signs of decreased platelets or bleeding - bruising, pinpoint red spots on the skin, black, tarry stools, blood in the urine  signs of decreased red blood cells - unusually weak or tired, feeling faint or lightheaded, falls  signs of infection - fever or chills, cough, sore throat, pain or difficulty passing urine  signs and symptoms of liver injury like dark yellow or brown urine; general ill feeling or flu-like symptoms; light-colored stools; loss of appetite; nausea; right upper belly pain; unusually weak or tired; yellowing of the eyes or skin  swelling of the ankles, feet, hands  unusually slow heartbeat Side effects that usually do not require medical attention (report to your doctor or health care professional if they continue or are bothersome):  diarrhea  hair loss  loss of appetite  muscle or joint pain  nausea, vomiting  pain, redness, or irritation at site where injected  tiredness This list may not describe all possible side effects. Call your doctor for medical advice about side effects. You may report side effects to FDA at 1-800-FDA-1088. Where should I keep my medicine? This drug is given in a hospital or clinic and will not be stored at home. NOTE: This sheet is a summary. It may not cover all  possible information. If you have questions about this medicine, talk to your doctor, pharmacist, or health care provider.  2021 Elsevier/Gold Standard (2019-04-26 13:37:23)  Carboplatin injection What is this medicine? CARBOPLATIN (KAR boe pla tin) is a chemotherapy drug. It targets fast dividing cells, like cancer cells, and causes these cells to die. This medicine is used to treat ovarian cancer and many other cancers. This medicine may be used for other purposes; ask your health care provider or pharmacist if you have questions. COMMON BRAND NAME(S): Paraplatin What should I tell my health care provider before I take this medicine? They need to know if you have any of these conditions:  blood disorders  hearing problems  kidney disease  recent or ongoing radiation therapy  an unusual or allergic reaction to carboplatin, cisplatin, other chemotherapy, other medicines, foods, dyes, or preservatives  pregnant or trying to get pregnant  breast-feeding How should I use this medicine? This drug is usually given as an infusion into a vein. It is administered in a hospital or clinic by a specially trained health care professional. Talk to your pediatrician regarding the use of this medicine in children. Special care may be needed. Overdosage: If you think you have taken too much of this medicine contact a poison control center or emergency room at once. NOTE: This medicine is only for you. Do not share this medicine with others. What if I miss a dose? It is important not to miss a dose. Call your doctor or health care professional if you are unable to keep an appointment. What may interact with this medicine?  medicines for seizures  medicines to increase blood counts like filgrastim, pegfilgrastim, sargramostim  some antibiotics like amikacin, gentamicin, neomycin, streptomycin, tobramycin  vaccines Talk to your doctor or health care professional before taking any of these  medicines:  acetaminophen  aspirin  ibuprofen  ketoprofen  naproxen This list may not describe all possible interactions. Give your health care provider a list of all the medicines, herbs, non-prescription drugs, or dietary supplements you use. Also tell them if you smoke, drink alcohol, or use illegal drugs. Some items may interact with your medicine. What should I watch for while using this medicine? Your condition will be monitored carefully while you are receiving this medicine. You will need important blood work done while you are taking this medicine. This drug may make you feel generally unwell. This is not uncommon, as chemotherapy can affect healthy cells as well as cancer cells. Report any side effects. Continue your course of treatment even though you feel ill unless your doctor tells you to stop. In some cases, you may be given additional medicines to help with side effects. Follow all directions for their use. Call your doctor or health care professional for advice if you get a fever, chills or sore throat, or other symptoms of a cold or flu. Do not treat yourself. This drug decreases your body's ability to fight infections. Try to avoid being around people who are sick. This medicine may increase your  risk to bruise or bleed. Call your doctor or health care professional if you notice any unusual bleeding. Be careful brushing and flossing your teeth or using a toothpick because you may get an infection or bleed more easily. If you have any dental work done, tell your dentist you are receiving this medicine. Avoid taking products that contain aspirin, acetaminophen, ibuprofen, naproxen, or ketoprofen unless instructed by your doctor. These medicines may hide a fever. Do not become pregnant while taking this medicine. Women should inform their doctor if they wish to become pregnant or think they might be pregnant. There is a potential for serious side effects to an unborn child. Talk  to your health care professional or pharmacist for more information. Do not breast-feed an infant while taking this medicine. What side effects may I notice from receiving this medicine? Side effects that you should report to your doctor or health care professional as soon as possible:  allergic reactions like skin rash, itching or hives, swelling of the face, lips, or tongue  signs of infection - fever or chills, cough, sore throat, pain or difficulty passing urine  signs of decreased platelets or bleeding - bruising, pinpoint red spots on the skin, black, tarry stools, nosebleeds  signs of decreased red blood cells - unusually weak or tired, fainting spells, lightheadedness  breathing problems  changes in hearing  changes in vision  chest pain  high blood pressure  low blood counts - This drug may decrease the number of white blood cells, red blood cells and platelets. You may be at increased risk for infections and bleeding.  nausea and vomiting  pain, swelling, redness or irritation at the injection site  pain, tingling, numbness in the hands or feet  problems with balance, talking, walking  trouble passing urine or change in the amount of urine Side effects that usually do not require medical attention (report to your doctor or health care professional if they continue or are bothersome):  hair loss  loss of appetite  metallic taste in the mouth or changes in taste This list may not describe all possible side effects. Call your doctor for medical advice about side effects. You may report side effects to FDA at 1-800-FDA-1088. Where should I keep my medicine? This drug is given in a hospital or clinic and will not be stored at home. NOTE: This sheet is a summary. It may not cover all possible information. If you have questions about this medicine, talk to your doctor, pharmacist, or health care provider.  2021 Elsevier/Gold Standard (2007-08-30 14:38:05)

## 2020-09-03 NOTE — Telephone Encounter (Signed)
Prescription refilled this morning, please mark it duplicate and deny.

## 2020-09-04 ENCOUNTER — Ambulatory Visit
Admission: RE | Admit: 2020-09-04 | Discharge: 2020-09-04 | Disposition: A | Discharge status: Home / Self Care | Payer: Medicare HMO | Source: Ambulatory Visit | Attending: Radiation Oncology | Admitting: Radiation Oncology

## 2020-09-04 DIAGNOSIS — C3431 Malignant neoplasm of lower lobe, right bronchus or lung: Secondary | ICD-10-CM | POA: Diagnosis not present

## 2020-09-04 DIAGNOSIS — Z510 Encounter for antineoplastic radiation therapy: Secondary | ICD-10-CM | POA: Diagnosis not present

## 2020-09-04 DIAGNOSIS — Z87891 Personal history of nicotine dependence: Secondary | ICD-10-CM | POA: Diagnosis not present

## 2020-09-05 ENCOUNTER — Other Ambulatory Visit: Payer: Self-pay

## 2020-09-05 ENCOUNTER — Ambulatory Visit
Admission: RE | Admit: 2020-09-05 | Discharge: 2020-09-05 | Disposition: A | Discharge status: Home / Self Care | Payer: Medicare HMO | Source: Ambulatory Visit | Attending: Radiation Oncology | Admitting: Radiation Oncology

## 2020-09-05 DIAGNOSIS — C3431 Malignant neoplasm of lower lobe, right bronchus or lung: Secondary | ICD-10-CM | POA: Diagnosis not present

## 2020-09-05 DIAGNOSIS — Z87891 Personal history of nicotine dependence: Secondary | ICD-10-CM | POA: Diagnosis not present

## 2020-09-05 DIAGNOSIS — Z510 Encounter for antineoplastic radiation therapy: Secondary | ICD-10-CM | POA: Diagnosis not present

## 2020-09-06 ENCOUNTER — Ambulatory Visit
Admission: RE | Admit: 2020-09-06 | Discharge: 2020-09-06 | Disposition: A | Discharge status: Home / Self Care | Payer: Medicare HMO | Source: Ambulatory Visit | Attending: Radiation Oncology | Admitting: Radiation Oncology

## 2020-09-06 DIAGNOSIS — C3431 Malignant neoplasm of lower lobe, right bronchus or lung: Secondary | ICD-10-CM | POA: Diagnosis not present

## 2020-09-06 DIAGNOSIS — Z510 Encounter for antineoplastic radiation therapy: Secondary | ICD-10-CM | POA: Diagnosis not present

## 2020-09-06 DIAGNOSIS — Z87891 Personal history of nicotine dependence: Secondary | ICD-10-CM | POA: Diagnosis not present

## 2020-09-09 ENCOUNTER — Other Ambulatory Visit: Payer: Self-pay

## 2020-09-09 ENCOUNTER — Ambulatory Visit
Admission: RE | Admit: 2020-09-09 | Discharge: 2020-09-09 | Disposition: A | Discharge status: Home / Self Care | Payer: Medicare HMO | Source: Ambulatory Visit | Attending: Radiation Oncology | Admitting: Radiation Oncology

## 2020-09-09 DIAGNOSIS — Z87891 Personal history of nicotine dependence: Secondary | ICD-10-CM | POA: Diagnosis not present

## 2020-09-09 DIAGNOSIS — Z510 Encounter for antineoplastic radiation therapy: Secondary | ICD-10-CM | POA: Diagnosis not present

## 2020-09-09 DIAGNOSIS — C3431 Malignant neoplasm of lower lobe, right bronchus or lung: Secondary | ICD-10-CM | POA: Diagnosis not present

## 2020-09-10 ENCOUNTER — Other Ambulatory Visit: Payer: Self-pay | Admitting: Oncology

## 2020-09-10 ENCOUNTER — Ambulatory Visit
Admission: RE | Admit: 2020-09-10 | Discharge: 2020-09-10 | Disposition: A | Discharge status: Home / Self Care | Payer: Medicare HMO | Source: Ambulatory Visit | Attending: Radiation Oncology | Admitting: Radiation Oncology

## 2020-09-10 ENCOUNTER — Inpatient Hospital Stay: Payer: Medicare HMO | Admitting: Oncology

## 2020-09-10 ENCOUNTER — Inpatient Hospital Stay: Payer: Medicare HMO

## 2020-09-10 ENCOUNTER — Inpatient Hospital Stay: Payer: Medicare HMO | Attending: Oncology

## 2020-09-10 VITALS — BP 128/76 | HR 102 | Temp 97.6°F | Resp 16 | Ht 73.0 in | Wt 208.2 lb

## 2020-09-10 VITALS — HR 96

## 2020-09-10 DIAGNOSIS — C3492 Malignant neoplasm of unspecified part of left bronchus or lung: Secondary | ICD-10-CM

## 2020-09-10 DIAGNOSIS — Z5111 Encounter for antineoplastic chemotherapy: Secondary | ICD-10-CM | POA: Insufficient documentation

## 2020-09-10 DIAGNOSIS — Z87891 Personal history of nicotine dependence: Secondary | ICD-10-CM | POA: Diagnosis not present

## 2020-09-10 DIAGNOSIS — Z510 Encounter for antineoplastic radiation therapy: Secondary | ICD-10-CM | POA: Diagnosis not present

## 2020-09-10 DIAGNOSIS — C3431 Malignant neoplasm of lower lobe, right bronchus or lung: Secondary | ICD-10-CM

## 2020-09-10 LAB — CBC WITH DIFFERENTIAL (CANCER CENTER ONLY)
Abs Immature Granulocytes: 0.06 10*3/uL (ref 0.00–0.07)
Basophils Absolute: 0 10*3/uL (ref 0.0–0.1)
Basophils Relative: 1 %
Eosinophils Absolute: 0.1 10*3/uL (ref 0.0–0.5)
Eosinophils Relative: 2 %
HCT: 38.3 % — ABNORMAL LOW (ref 39.0–52.0)
Hemoglobin: 13.4 g/dL (ref 13.0–17.0)
Immature Granulocytes: 2 %
Lymphocytes Relative: 14 %
Lymphs Abs: 0.5 10*3/uL — ABNORMAL LOW (ref 0.7–4.0)
MCH: 30.2 pg (ref 26.0–34.0)
MCHC: 35 g/dL (ref 30.0–36.0)
MCV: 86.3 fL (ref 80.0–100.0)
Monocytes Absolute: 0.3 10*3/uL (ref 0.1–1.0)
Monocytes Relative: 9 %
Neutro Abs: 2.6 10*3/uL (ref 1.7–7.7)
Neutrophils Relative %: 72 %
Platelet Count: 142 10*3/uL — ABNORMAL LOW (ref 150–400)
RBC: 4.44 MIL/uL (ref 4.22–5.81)
RDW: 13.2 % (ref 11.5–15.5)
WBC Count: 3.5 10*3/uL — ABNORMAL LOW (ref 4.0–10.5)
nRBC: 0 % (ref 0.0–0.2)

## 2020-09-10 LAB — CMP (CANCER CENTER ONLY)
ALT: 23 U/L (ref 0–44)
AST: 18 U/L (ref 15–41)
Albumin: 3.6 g/dL (ref 3.5–5.0)
Alkaline Phosphatase: 70 U/L (ref 38–126)
Anion gap: 13 (ref 5–15)
BUN: 13 mg/dL (ref 8–23)
CO2: 25 mmol/L (ref 22–32)
Calcium: 9 mg/dL (ref 8.9–10.3)
Chloride: 99 mmol/L (ref 98–111)
Creatinine: 0.9 mg/dL (ref 0.61–1.24)
GFR, Estimated: >60 mL/min (ref >60)
Glucose, Bld: 130 mg/dL — ABNORMAL HIGH (ref 70–99)
Potassium: 3 mmol/L — ABNORMAL LOW (ref 3.5–5.1)
Sodium: 137 mmol/L (ref 135–145)
Total Bilirubin: 1.2 mg/dL (ref 0.3–1.2)
Total Protein: 6.9 g/dL (ref 6.5–8.1)

## 2020-09-10 MED ORDER — HYDROCODONE-HOMATROPINE 5-1.5 MG/5ML PO SYRP: 5.0000 mL | ORAL_SOLUTION | Freq: Four times a day (QID) | ORAL | 0 refills | Status: DC | PRN

## 2020-09-10 MED ORDER — SODIUM CHLORIDE 0.9 % IV SOLN
Freq: Once | INTRAVENOUS | Status: AC
Start: 2020-09-10 — End: 2020-09-10
  Filled 2020-09-10: qty 250

## 2020-09-10 MED ORDER — DIPHENHYDRAMINE HCL 50 MG/ML IJ SOLN
INTRAMUSCULAR | Status: AC
  Filled 2020-09-10: qty 1

## 2020-09-10 MED ORDER — FAMOTIDINE IN NACL 20-0.9 MG/50ML-% IV SOLN
20.0000 mg | Freq: Once | INTRAVENOUS | Status: AC
  Administered 2020-09-10: 20 mg via INTRAVENOUS

## 2020-09-10 MED ORDER — CARBOPLATIN CHEMO INJECTION 450 MG/45ML
240.0000 mg | Freq: Once | INTRAVENOUS | Status: AC
  Administered 2020-09-10: 240 mg via INTRAVENOUS
  Filled 2020-09-10: qty 24

## 2020-09-10 MED ORDER — PALONOSETRON HCL INJECTION 0.25 MG/5ML
0.2500 mg | Freq: Once | INTRAVENOUS | Status: AC
  Administered 2020-09-10: 0.25 mg via INTRAVENOUS

## 2020-09-10 MED ORDER — FAMOTIDINE IN NACL 20-0.9 MG/50ML-% IV SOLN
INTRAVENOUS | Status: AC
  Filled 2020-09-10: qty 50

## 2020-09-10 MED ORDER — PALONOSETRON HCL INJECTION 0.25 MG/5ML
INTRAVENOUS | Status: AC
  Filled 2020-09-10: qty 5

## 2020-09-10 MED ORDER — SODIUM CHLORIDE 0.9 % IV SOLN
45.0000 mg/m2 | Freq: Once | INTRAVENOUS | Status: AC
  Administered 2020-09-10: 102 mg via INTRAVENOUS
  Filled 2020-09-10: qty 17

## 2020-09-10 MED ORDER — SODIUM CHLORIDE 0.9 % IV SOLN
20.0000 mg | Freq: Once | INTRAVENOUS | Status: AC
  Administered 2020-09-10: 20 mg via INTRAVENOUS
  Filled 2020-09-10: qty 20

## 2020-09-10 MED ORDER — DIPHENHYDRAMINE HCL 50 MG/ML IJ SOLN
50.0000 mg | Freq: Once | INTRAMUSCULAR | Status: AC
Start: 2020-09-10 — End: 2020-09-10
  Administered 2020-09-10: 50 mg via INTRAVENOUS

## 2020-09-10 NOTE — Progress Notes (Signed)
Hematology and Oncology Follow Up Visit  Clifford [name] [id number] 1945-07-24 75 y.o. 09/10/2020 7:48 AM   Principle Diagnosis: 75 year old man with lung cancer diagnosed in February 2022.  He was found to have stage IIIA non-small cell  with 9.0 cm right lower lung mass with adenosquamous component.  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  Prior Therapy: 1. Status post pleurodesis and biopsy of the pleural-based nodule. 2. Received 6 cycles of CVP with rituximab. Therapy concluded in December 2007 after achieving remission at the time.  Current therapy: Definitive therapy with radiation and weekly chemotherapy utilizing carboplatin and paclitaxel started on August 20, 2020.  Today cycle 4 of therapy.  Interim History:  Clifford [name] returns today for repeat follow-up.  Since the last visit, he has been receiving definitive therapy with radiation and weekly chemotherapy.  He has tolerated the treatment well without any major complaints.  He denies any nausea, vomiting or abdominal pain.  He continues to have issues with cough which is persistent.  He had denies any skin irritation or dysphagia.  He does report some mild hoarseness.  He continues to work part-time play golf occasionally.  Performance status remained excellent.     Medications: Updated on review. Current Outpatient Medications  Medication Sig Dispense Refill  . amLODipine (NORVASC) 5 MG tablet Take 5 mg by mouth daily.     Marland Kitchen aspirin 81 MG tablet Take 81 mg by mouth daily.    Marland Kitchen atorvastatin (LIPITOR) 10 MG tablet Take 10 mg by mouth daily.    . benzonatate (TESSALON) 100 MG capsule Take 100-200 mg by mouth 3 (three) times daily as needed for cough.    . cetirizine (ZYRTEC) 10 MG tablet Take 10 mg by mouth daily as needed for allergies.    . hydrochlorothiazide (HYDRODIURIL) 25 MG  tablet Take 25 mg by mouth daily.    Marland Kitchen HYDROcodone-homatropine (HYCODAN) 5-1.5 MG/5ML syrup Take 5 mLs by mouth every 6 (six) hours as needed for cough. 120 mL 0  . prochlorperazine (COMPAZINE) 10 MG tablet Take 1 tablet (10 mg total) by mouth every 6 (six) hours as needed for nausea or vomiting. 30 tablet 0  . traMADol (ULTRAM) 50 MG tablet Take 50 mg by mouth daily as needed for pain.     No current facility-administered medications for this visit.    Allergies: No Known Allergies     Physical Exam:  Blood pressure 128/76, pulse (!) 102, temperature 97.6 F (36.4 C), temperature source Tympanic, resp. rate 16, height 6' 1"  (1.854 m), weight 208 lb 3.2 oz (94.4 kg), SpO2 97 %.     ECOG: 0   General appearance: Comfortable appearing without any discomfort Head: Normocephalic without any trauma Oropharynx: Mucous membranes are moist and pink without any thrush or ulcers. Eyes: Pupils are equal and round reactive to light. Lymph nodes: No cervical, supraclavicular, inguinal or axillary lymphadenopathy.   Heart:regular rate and rhythm.  S1 and S2 without leg edema. Lung: Clear without any rhonchi or wheezes.  No dullness to percussion. Abdomin: Soft, nontender, nondistended with good bowel sounds.  No hepatosplenomegaly. Musculoskeletal: No joint deformity or effusion.  Full range of motion noted. Neurological: No deficits noted on motor, sensory and deep tendon reflex exam. Skin: No petechial rash or dryness.  Appeared moist.    Lab Results: Lab Results  Component Value Date  WBC 3.7 (L) 09/03/2020   HGB 14.1 09/03/2020   HCT 40.2 09/03/2020   MCV 87.6 09/03/2020   PLT 233 09/03/2020     Chemistry      Component Value Date/Time   NA 137 09/03/2020 1052   NA 144 10/21/2016 0808   K 3.3 (L) 09/03/2020 1052   K 4.0 10/21/2016 0808   CL 100 09/03/2020 1052   CL 109 (H) 10/26/2012 0841   CO2 25 09/03/2020 1052   CO2 29 10/21/2016 0808   BUN 11 09/03/2020 1052   BUN  15.3 10/21/2016 0808   CREATININE 0.82 09/03/2020 1052   CREATININE 1.0 10/21/2016 0808      Component Value Date/Time   CALCIUM 9.1 09/03/2020 1052   CALCIUM 9.3 10/21/2016 0808   ALKPHOS 70 09/03/2020 1052   ALKPHOS 53 10/21/2016 0808   AST 18 09/03/2020 1052   AST 27 10/21/2016 0808   ALT 19 09/03/2020 1052   ALT 37 10/21/2016 0808   BILITOT 1.1 09/03/2020 1052   BILITOT 1.09 10/21/2016 0808         Impression and Plan:  75 year old with:   1.  Non-small cell lung cancer presented with stage IIIA with adenosquamous histology February 2021.  He is currently receiving definitive therapy with radiation and weekly chemotherapy without any complications.  Risks and benefits of continuing this treatment were discussed at this time.  Potential complications include nausea, neutropenia among others were reviewed.  82 is agreeable to proceed complete 6 weeks of therapy.   2.  Marginal zone lymphoma diagnosed in 2007.  He continues to be in remission without any evidence of follow-up relapse.  3.  IV access: Peripheral veins are currently in use without any issues.  4.  Antiemetics: No nausea or vomiting reported at this time.  Compazine is available to him.  5.  Cough: Controlled by Hycodan which I will refill for him.  6.  Goals of care and prognosis: Aggressive measures are warranted given his excellent performance status and potentially treatable disease.  7.  Follow-up: We will continue to follow weekly for carboplatin paclitaxel and MD follow-up in the next 3 to 4 weeks.   30  minutes were dedicated to this visit.  The time was spent on updating disease status, discussing treatment options and future plan of care review.   Zola Button, MD 4/5/20227:48 AM

## 2020-09-10 NOTE — Addendum Note (Signed)
Addended by: Wyatt Portela on: 09/10/2020 08:26 AM   Modules accepted: Orders

## 2020-09-10 NOTE — Patient Instructions (Signed)
[facility] Discharge Instructions for Patients Receiving Chemotherapy  Today you received the following chemotherapy agents: paclitaxel/carboplatin.  To help prevent nausea and vomiting after your treatment, we encourage you to take your nausea medication as directed.   If you develop nausea and vomiting that is not controlled by your nausea medication, call the clinic.   BELOW ARE SYMPTOMS THAT SHOULD BE REPORTED IMMEDIATELY:  *FEVER GREATER THAN 100.5 F  *CHILLS WITH OR WITHOUT FEVER  NAUSEA AND VOMITING THAT IS NOT CONTROLLED WITH YOUR NAUSEA MEDICATION  *UNUSUAL SHORTNESS OF BREATH  *UNUSUAL BRUISING OR BLEEDING  TENDERNESS IN MOUTH AND THROAT WITH OR WITHOUT PRESENCE OF ULCERS  *URINARY PROBLEMS  *BOWEL PROBLEMS  UNUSUAL RASH Items with * indicate a potential emergency and should be followed up as soon as possible.  Feel free to call the clinic should you have any questions or concerns. The clinic phone number is [phone].  Please show the [facility] at check-in to the Emergency Department and triage nurse.

## 2020-09-11 ENCOUNTER — Other Ambulatory Visit: Payer: Self-pay | Admitting: Oncology

## 2020-09-11 ENCOUNTER — Ambulatory Visit
Admission: RE | Admit: 2020-09-11 | Discharge: 2020-09-11 | Disposition: A | Discharge status: Home / Self Care | Payer: Medicare HMO | Source: Ambulatory Visit | Attending: Radiation Oncology | Admitting: Radiation Oncology

## 2020-09-11 ENCOUNTER — Other Ambulatory Visit: Payer: Self-pay

## 2020-09-11 DIAGNOSIS — C3431 Malignant neoplasm of lower lobe, right bronchus or lung: Secondary | ICD-10-CM | POA: Diagnosis not present

## 2020-09-11 DIAGNOSIS — Z510 Encounter for antineoplastic radiation therapy: Secondary | ICD-10-CM | POA: Diagnosis not present

## 2020-09-11 DIAGNOSIS — Z87891 Personal history of nicotine dependence: Secondary | ICD-10-CM | POA: Diagnosis not present

## 2020-09-11 NOTE — Telephone Encounter (Signed)
Per chart this medication was refilled on 09/10/20.

## 2020-09-12 ENCOUNTER — Ambulatory Visit
Admission: RE | Admit: 2020-09-12 | Discharge: 2020-09-12 | Disposition: A | Discharge status: Home / Self Care | Payer: Medicare HMO | Source: Ambulatory Visit | Attending: Radiation Oncology | Admitting: Radiation Oncology

## 2020-09-12 DIAGNOSIS — Z87891 Personal history of nicotine dependence: Secondary | ICD-10-CM | POA: Diagnosis not present

## 2020-09-12 DIAGNOSIS — Z510 Encounter for antineoplastic radiation therapy: Secondary | ICD-10-CM | POA: Diagnosis not present

## 2020-09-12 DIAGNOSIS — C3431 Malignant neoplasm of lower lobe, right bronchus or lung: Secondary | ICD-10-CM | POA: Diagnosis not present

## 2020-09-12 NOTE — Telephone Encounter (Signed)
Please refuse prescription as this is a duplicate.  Medication was refilled on 09/10/20.

## 2020-09-13 ENCOUNTER — Ambulatory Visit
Admission: RE | Admit: 2020-09-13 | Discharge: 2020-09-13 | Disposition: A | Discharge status: Home / Self Care | Payer: Medicare HMO | Source: Ambulatory Visit | Attending: Radiation Oncology | Admitting: Radiation Oncology

## 2020-09-13 ENCOUNTER — Other Ambulatory Visit: Payer: Self-pay

## 2020-09-13 DIAGNOSIS — Z87891 Personal history of nicotine dependence: Secondary | ICD-10-CM | POA: Diagnosis not present

## 2020-09-13 DIAGNOSIS — Z510 Encounter for antineoplastic radiation therapy: Secondary | ICD-10-CM | POA: Diagnosis not present

## 2020-09-13 DIAGNOSIS — C3431 Malignant neoplasm of lower lobe, right bronchus or lung: Secondary | ICD-10-CM | POA: Diagnosis not present

## 2020-09-15 ENCOUNTER — Other Ambulatory Visit: Payer: Self-pay | Admitting: Oncology

## 2020-09-16 ENCOUNTER — Other Ambulatory Visit: Payer: Self-pay

## 2020-09-16 ENCOUNTER — Ambulatory Visit
Admission: RE | Admit: 2020-09-16 | Discharge: 2020-09-16 | Disposition: A | Discharge status: Home / Self Care | Payer: Medicare HMO | Source: Ambulatory Visit | Attending: Radiation Oncology | Admitting: Radiation Oncology

## 2020-09-16 DIAGNOSIS — Z87891 Personal history of nicotine dependence: Secondary | ICD-10-CM | POA: Diagnosis not present

## 2020-09-16 DIAGNOSIS — C3431 Malignant neoplasm of lower lobe, right bronchus or lung: Secondary | ICD-10-CM | POA: Diagnosis not present

## 2020-09-16 DIAGNOSIS — Z510 Encounter for antineoplastic radiation therapy: Secondary | ICD-10-CM | POA: Diagnosis not present

## 2020-09-16 NOTE — Telephone Encounter (Signed)
Dr. Alen Blew, Not sure why this keeps coming in as you reordered it already 6 days ago.  He should have plenty left if he is taking appropriately. Gardiner Rhyme, RN

## 2020-09-17 ENCOUNTER — Other Ambulatory Visit: Payer: Self-pay | Admitting: Oncology

## 2020-09-17 ENCOUNTER — Inpatient Hospital Stay: Payer: Medicare HMO

## 2020-09-17 ENCOUNTER — Ambulatory Visit
Admission: RE | Admit: 2020-09-17 | Discharge: 2020-09-17 | Disposition: A | Discharge status: Home / Self Care | Payer: Medicare HMO | Source: Ambulatory Visit | Attending: Radiation Oncology | Admitting: Radiation Oncology

## 2020-09-17 VITALS — BP 114/68 | HR 96 | Temp 98.8°F | Resp 18

## 2020-09-17 DIAGNOSIS — C3431 Malignant neoplasm of lower lobe, right bronchus or lung: Secondary | ICD-10-CM

## 2020-09-17 DIAGNOSIS — Z87891 Personal history of nicotine dependence: Secondary | ICD-10-CM | POA: Diagnosis not present

## 2020-09-17 DIAGNOSIS — Z5111 Encounter for antineoplastic chemotherapy: Secondary | ICD-10-CM | POA: Diagnosis not present

## 2020-09-17 DIAGNOSIS — C3492 Malignant neoplasm of unspecified part of left bronchus or lung: Secondary | ICD-10-CM

## 2020-09-17 DIAGNOSIS — Z510 Encounter for antineoplastic radiation therapy: Secondary | ICD-10-CM | POA: Diagnosis not present

## 2020-09-17 LAB — CMP (CANCER CENTER ONLY)
ALT: 26 U/L (ref 0–44)
AST: 20 U/L (ref 15–41)
Albumin: 3.4 g/dL — ABNORMAL LOW (ref 3.5–5.0)
Alkaline Phosphatase: 66 U/L (ref 38–126)
Anion gap: 10 (ref 5–15)
BUN: 15 mg/dL (ref 8–23)
CO2: 26 mmol/L (ref 22–32)
Calcium: 9.1 mg/dL (ref 8.9–10.3)
Chloride: 102 mmol/L (ref 98–111)
Creatinine: 0.77 mg/dL (ref 0.61–1.24)
GFR, Estimated: >60 mL/min (ref >60)
Glucose, Bld: 96 mg/dL (ref 70–99)
Potassium: 3.2 mmol/L — ABNORMAL LOW (ref 3.5–5.1)
Sodium: 138 mmol/L (ref 135–145)
Total Bilirubin: 1.4 mg/dL — ABNORMAL HIGH (ref 0.3–1.2)
Total Protein: 6.5 g/dL (ref 6.5–8.1)

## 2020-09-17 LAB — CBC WITH DIFFERENTIAL (CANCER CENTER ONLY)
Abs Immature Granulocytes: 0.03 10*3/uL (ref 0.00–0.07)
Basophils Absolute: 0 10*3/uL (ref 0.0–0.1)
Basophils Relative: 0 %
Eosinophils Absolute: 0 10*3/uL (ref 0.0–0.5)
Eosinophils Relative: 1 %
HCT: 33.9 % — ABNORMAL LOW (ref 39.0–52.0)
Hemoglobin: 12 g/dL — ABNORMAL LOW (ref 13.0–17.0)
Immature Granulocytes: 1 %
Lymphocytes Relative: 15 %
Lymphs Abs: 0.4 10*3/uL — ABNORMAL LOW (ref 0.7–4.0)
MCH: 30.5 pg (ref 26.0–34.0)
MCHC: 35.4 g/dL (ref 30.0–36.0)
MCV: 86 fL (ref 80.0–100.0)
Monocytes Absolute: 0.2 10*3/uL (ref 0.1–1.0)
Monocytes Relative: 8 %
Neutro Abs: 2.1 10*3/uL (ref 1.7–7.7)
Neutrophils Relative %: 75 %
Platelet Count: 80 10*3/uL — ABNORMAL LOW (ref 150–400)
RBC: 3.94 MIL/uL — ABNORMAL LOW (ref 4.22–5.81)
RDW: 13.5 % (ref 11.5–15.5)
WBC Count: 2.7 10*3/uL — ABNORMAL LOW (ref 4.0–10.5)
nRBC: 0 % (ref 0.0–0.2)

## 2020-09-17 MED ORDER — DIPHENHYDRAMINE HCL 50 MG/ML IJ SOLN
50.0000 mg | Freq: Once | INTRAMUSCULAR | Status: AC
Start: 2020-09-17 — End: 2020-09-17
  Administered 2020-09-17: 50 mg via INTRAVENOUS

## 2020-09-17 MED ORDER — DEXAMETHASONE SODIUM PHOSPHATE 100 MG/10ML IJ SOLN
20.0000 mg | Freq: Once | INTRAMUSCULAR | Status: AC
  Administered 2020-09-17: 20 mg via INTRAVENOUS
  Filled 2020-09-17: qty 20

## 2020-09-17 MED ORDER — PALONOSETRON HCL INJECTION 0.25 MG/5ML
INTRAVENOUS | Status: AC
  Filled 2020-09-17: qty 5

## 2020-09-17 MED ORDER — SODIUM CHLORIDE 0.9 % IV SOLN
240.0000 mg | Freq: Once | INTRAVENOUS | Status: AC
  Administered 2020-09-17: 240 mg via INTRAVENOUS
  Filled 2020-09-17: qty 24

## 2020-09-17 MED ORDER — HYDROCODONE-HOMATROPINE 5-1.5 MG/5ML PO SYRP: 5.0000 mL | ORAL_SOLUTION | Freq: Four times a day (QID) | ORAL | 0 refills | Status: DC | PRN

## 2020-09-17 MED ORDER — FAMOTIDINE IN NACL 20-0.9 MG/50ML-% IV SOLN
20.0000 mg | Freq: Once | INTRAVENOUS | Status: AC
Start: 2020-09-17 — End: 2020-09-17
  Administered 2020-09-17: 20 mg via INTRAVENOUS

## 2020-09-17 MED ORDER — SODIUM CHLORIDE 0.9 % IV SOLN
Freq: Once | INTRAVENOUS | Status: AC
Start: 2020-09-17 — End: 2020-09-17
  Filled 2020-09-17: qty 250

## 2020-09-17 MED ORDER — DIPHENHYDRAMINE HCL 50 MG/ML IJ SOLN
INTRAMUSCULAR | Status: AC
  Filled 2020-09-17: qty 1

## 2020-09-17 MED ORDER — PACLITAXEL CHEMO INJECTION 300 MG/50ML
45.0000 mg/m2 | Freq: Once | INTRAVENOUS | Status: AC
Start: 2020-09-17 — End: 2020-09-17
  Administered 2020-09-17: 102 mg via INTRAVENOUS
  Filled 2020-09-17: qty 17

## 2020-09-17 MED ORDER — HEPARIN SOD (PORK) LOCK FLUSH 100 UNIT/ML IV SOLN
500.0000 [IU] | Freq: Once | INTRAVENOUS | Status: DC | PRN
  Filled 2020-09-17: qty 5

## 2020-09-17 MED ORDER — FAMOTIDINE IN NACL 20-0.9 MG/50ML-% IV SOLN
INTRAVENOUS | Status: AC
  Filled 2020-09-17: qty 50

## 2020-09-17 MED ORDER — PALONOSETRON HCL INJECTION 0.25 MG/5ML
0.2500 mg | Freq: Once | INTRAVENOUS | Status: AC
  Administered 2020-09-17: 0.25 mg via INTRAVENOUS

## 2020-09-17 MED ORDER — SODIUM CHLORIDE 0.9% FLUSH
10.0000 mL | INTRAVENOUS | Status: DC | PRN
  Filled 2020-09-17: qty 10

## 2020-09-17 NOTE — Patient Instructions (Signed)
[facility] Discharge Instructions for Patients Receiving Chemotherapy  Today you received the following chemotherapy agents: paclitaxel/carboplatin.  To help prevent nausea and vomiting after your treatment, we encourage you to take your nausea medication as directed.   If you develop nausea and vomiting that is not controlled by your nausea medication, call the clinic.   BELOW ARE SYMPTOMS THAT SHOULD BE REPORTED IMMEDIATELY:  *FEVER GREATER THAN 100.5 F  *CHILLS WITH OR WITHOUT FEVER  NAUSEA AND VOMITING THAT IS NOT CONTROLLED WITH YOUR NAUSEA MEDICATION  *UNUSUAL SHORTNESS OF BREATH  *UNUSUAL BRUISING OR BLEEDING  TENDERNESS IN MOUTH AND THROAT WITH OR WITHOUT PRESENCE OF ULCERS  *URINARY PROBLEMS  *BOWEL PROBLEMS  UNUSUAL RASH Items with * indicate a potential emergency and should be followed up as soon as possible.  Feel free to call the clinic should you have any questions or concerns. The clinic phone number is [phone].  Please show the [facility] at check-in to the Emergency Department and triage nurse.

## 2020-09-17 NOTE — Progress Notes (Signed)
Per Dr. Alen Blew OK to treat with platelets of 80

## 2020-09-18 ENCOUNTER — Ambulatory Visit: Payer: Medicare HMO

## 2020-09-18 ENCOUNTER — Other Ambulatory Visit: Payer: Self-pay

## 2020-09-18 ENCOUNTER — Ambulatory Visit
Admission: RE | Admit: 2020-09-18 | Discharge: 2020-09-18 | Disposition: A | Discharge status: Home / Self Care | Payer: Medicare HMO | Source: Ambulatory Visit | Attending: Radiation Oncology | Admitting: Radiation Oncology

## 2020-09-18 DIAGNOSIS — Z87891 Personal history of nicotine dependence: Secondary | ICD-10-CM | POA: Diagnosis not present

## 2020-09-18 DIAGNOSIS — Z510 Encounter for antineoplastic radiation therapy: Secondary | ICD-10-CM | POA: Diagnosis not present

## 2020-09-18 DIAGNOSIS — C3431 Malignant neoplasm of lower lobe, right bronchus or lung: Secondary | ICD-10-CM | POA: Diagnosis not present

## 2020-09-19 ENCOUNTER — Ambulatory Visit
Admission: RE | Admit: 2020-09-19 | Discharge: 2020-09-19 | Disposition: A | Discharge status: Home / Self Care | Payer: Medicare HMO | Source: Ambulatory Visit | Attending: Radiation Oncology | Admitting: Radiation Oncology

## 2020-09-19 ENCOUNTER — Ambulatory Visit: Payer: Medicare HMO

## 2020-09-19 DIAGNOSIS — Z87891 Personal history of nicotine dependence: Secondary | ICD-10-CM | POA: Diagnosis not present

## 2020-09-19 DIAGNOSIS — C3431 Malignant neoplasm of lower lobe, right bronchus or lung: Secondary | ICD-10-CM | POA: Diagnosis not present

## 2020-09-19 DIAGNOSIS — Z510 Encounter for antineoplastic radiation therapy: Secondary | ICD-10-CM | POA: Diagnosis not present

## 2020-09-20 ENCOUNTER — Ambulatory Visit
Admission: RE | Admit: 2020-09-20 | Discharge: 2020-09-20 | Disposition: A | Discharge status: Home / Self Care | Payer: Medicare HMO | Source: Ambulatory Visit | Attending: Radiation Oncology | Admitting: Radiation Oncology

## 2020-09-20 ENCOUNTER — Ambulatory Visit: Payer: Medicare HMO

## 2020-09-20 ENCOUNTER — Other Ambulatory Visit: Payer: Self-pay

## 2020-09-20 DIAGNOSIS — C3431 Malignant neoplasm of lower lobe, right bronchus or lung: Secondary | ICD-10-CM | POA: Diagnosis not present

## 2020-09-20 DIAGNOSIS — Z510 Encounter for antineoplastic radiation therapy: Secondary | ICD-10-CM | POA: Diagnosis not present

## 2020-09-20 DIAGNOSIS — Z87891 Personal history of nicotine dependence: Secondary | ICD-10-CM | POA: Diagnosis not present

## 2020-09-23 ENCOUNTER — Ambulatory Visit
Admission: RE | Admit: 2020-09-23 | Discharge: 2020-09-23 | Disposition: A | Discharge status: Home / Self Care | Payer: Medicare HMO | Source: Ambulatory Visit | Attending: Radiation Oncology | Admitting: Radiation Oncology

## 2020-09-23 ENCOUNTER — Other Ambulatory Visit: Payer: Self-pay

## 2020-09-23 DIAGNOSIS — Z87891 Personal history of nicotine dependence: Secondary | ICD-10-CM | POA: Diagnosis not present

## 2020-09-23 DIAGNOSIS — Z510 Encounter for antineoplastic radiation therapy: Secondary | ICD-10-CM | POA: Diagnosis not present

## 2020-09-23 DIAGNOSIS — C3431 Malignant neoplasm of lower lobe, right bronchus or lung: Secondary | ICD-10-CM | POA: Diagnosis not present

## 2020-09-24 ENCOUNTER — Inpatient Hospital Stay: Payer: Medicare HMO

## 2020-09-24 ENCOUNTER — Ambulatory Visit
Admission: RE | Admit: 2020-09-24 | Discharge: 2020-09-24 | Disposition: A | Discharge status: Home / Self Care | Payer: Medicare HMO | Source: Ambulatory Visit | Attending: Radiation Oncology | Admitting: Radiation Oncology

## 2020-09-24 VITALS — BP 119/72 | HR 100 | Resp 18 | Wt 207.0 lb

## 2020-09-24 DIAGNOSIS — Z510 Encounter for antineoplastic radiation therapy: Secondary | ICD-10-CM | POA: Diagnosis not present

## 2020-09-24 DIAGNOSIS — C3492 Malignant neoplasm of unspecified part of left bronchus or lung: Secondary | ICD-10-CM

## 2020-09-24 DIAGNOSIS — Z5111 Encounter for antineoplastic chemotherapy: Secondary | ICD-10-CM | POA: Diagnosis not present

## 2020-09-24 DIAGNOSIS — Z87891 Personal history of nicotine dependence: Secondary | ICD-10-CM | POA: Diagnosis not present

## 2020-09-24 DIAGNOSIS — C3431 Malignant neoplasm of lower lobe, right bronchus or lung: Secondary | ICD-10-CM | POA: Diagnosis not present

## 2020-09-24 LAB — CBC WITH DIFFERENTIAL (CANCER CENTER ONLY)
Abs Immature Granulocytes: 0.01 10*3/uL (ref 0.00–0.07)
Basophils Absolute: 0 10*3/uL (ref 0.0–0.1)
Basophils Relative: 1 %
Eosinophils Absolute: 0 10*3/uL (ref 0.0–0.5)
Eosinophils Relative: 1 %
HCT: 29.2 % — ABNORMAL LOW (ref 39.0–52.0)
Hemoglobin: 10.4 g/dL — ABNORMAL LOW (ref 13.0–17.0)
Immature Granulocytes: 1 %
Lymphocytes Relative: 17 %
Lymphs Abs: 0.3 10*3/uL — ABNORMAL LOW (ref 0.7–4.0)
MCH: 30.4 pg (ref 26.0–34.0)
MCHC: 35.6 g/dL (ref 30.0–36.0)
MCV: 85.4 fL (ref 80.0–100.0)
Monocytes Absolute: 0.1 10*3/uL (ref 0.1–1.0)
Monocytes Relative: 8 %
Neutro Abs: 1.2 10*3/uL — ABNORMAL LOW (ref 1.7–7.7)
Neutrophils Relative %: 72 %
Platelet Count: 96 10*3/uL — ABNORMAL LOW (ref 150–400)
RBC: 3.42 MIL/uL — ABNORMAL LOW (ref 4.22–5.81)
RDW: 14.3 % (ref 11.5–15.5)
WBC Count: 1.7 10*3/uL — ABNORMAL LOW (ref 4.0–10.5)
nRBC: 0 % (ref 0.0–0.2)

## 2020-09-24 LAB — CMP (CANCER CENTER ONLY)
ALT: 40 U/L (ref 0–44)
AST: 30 U/L (ref 15–41)
Albumin: 3.7 g/dL (ref 3.5–5.0)
Alkaline Phosphatase: 73 U/L (ref 38–126)
Anion gap: 10 (ref 5–15)
BUN: 14 mg/dL (ref 8–23)
CO2: 27 mmol/L (ref 22–32)
Calcium: 9 mg/dL (ref 8.9–10.3)
Chloride: 99 mmol/L (ref 98–111)
Creatinine: 0.87 mg/dL (ref 0.61–1.24)
GFR, Estimated: >60 mL/min (ref >60)
Glucose, Bld: 95 mg/dL (ref 70–99)
Potassium: 3.2 mmol/L — ABNORMAL LOW (ref 3.5–5.1)
Sodium: 136 mmol/L (ref 135–145)
Total Bilirubin: 1.8 mg/dL — ABNORMAL HIGH (ref 0.3–1.2)
Total Protein: 6.6 g/dL (ref 6.5–8.1)

## 2020-09-24 MED ORDER — FAMOTIDINE IN NACL 20-0.9 MG/50ML-% IV SOLN
INTRAVENOUS | Status: AC
  Filled 2020-09-24: qty 50

## 2020-09-24 MED ORDER — PALONOSETRON HCL INJECTION 0.25 MG/5ML
INTRAVENOUS | Status: AC
  Filled 2020-09-24: qty 5

## 2020-09-24 MED ORDER — PALONOSETRON HCL INJECTION 0.25 MG/5ML
0.2500 mg | Freq: Once | INTRAVENOUS | Status: AC
Start: 2020-09-24 — End: 2020-09-24
  Administered 2020-09-24: 0.25 mg via INTRAVENOUS

## 2020-09-24 MED ORDER — DIPHENHYDRAMINE HCL 50 MG/ML IJ SOLN
INTRAMUSCULAR | Status: AC
  Filled 2020-09-24: qty 1

## 2020-09-24 MED ORDER — PACLITAXEL CHEMO INJECTION 300 MG/50ML
45.0000 mg/m2 | Freq: Once | INTRAVENOUS | Status: AC
  Administered 2020-09-24: 102 mg via INTRAVENOUS
  Filled 2020-09-24: qty 17

## 2020-09-24 MED ORDER — SODIUM CHLORIDE 0.9 % IV SOLN
240.0000 mg | Freq: Once | INTRAVENOUS | Status: AC
  Administered 2020-09-24: 240 mg via INTRAVENOUS
  Filled 2020-09-24: qty 24

## 2020-09-24 MED ORDER — SODIUM CHLORIDE 0.9 % IV SOLN
Freq: Once | INTRAVENOUS | Status: AC
Start: 2020-09-24 — End: 2020-09-24
  Filled 2020-09-24: qty 250

## 2020-09-24 MED ORDER — DEXAMETHASONE SODIUM PHOSPHATE 100 MG/10ML IJ SOLN
20.0000 mg | Freq: Once | INTRAMUSCULAR | Status: AC
  Administered 2020-09-24: 20 mg via INTRAVENOUS
  Filled 2020-09-24: qty 20

## 2020-09-24 MED ORDER — DIPHENHYDRAMINE HCL 50 MG/ML IJ SOLN
50.0000 mg | Freq: Once | INTRAMUSCULAR | Status: AC
  Administered 2020-09-24: 50 mg via INTRAVENOUS

## 2020-09-24 MED ORDER — FAMOTIDINE IN NACL 20-0.9 MG/50ML-% IV SOLN
20.0000 mg | Freq: Once | INTRAVENOUS | Status: AC
Start: 2020-09-24 — End: 2020-09-24
  Administered 2020-09-24: 20 mg via INTRAVENOUS

## 2020-09-24 NOTE — Patient Instructions (Signed)
[facility] Discharge Instructions for Patients Receiving Chemotherapy  Today you received the following chemotherapy agents: paclitaxel/carboplatin.  To help prevent nausea and vomiting after your treatment, we encourage you to take your nausea medication as directed.   If you develop nausea and vomiting that is not controlled by your nausea medication, call the clinic.   BELOW ARE SYMPTOMS THAT SHOULD BE REPORTED IMMEDIATELY:  *FEVER GREATER THAN 100.5 F  *CHILLS WITH OR WITHOUT FEVER  NAUSEA AND VOMITING THAT IS NOT CONTROLLED WITH YOUR NAUSEA MEDICATION  *UNUSUAL SHORTNESS OF BREATH  *UNUSUAL BRUISING OR BLEEDING  TENDERNESS IN MOUTH AND THROAT WITH OR WITHOUT PRESENCE OF ULCERS  *URINARY PROBLEMS  *BOWEL PROBLEMS  UNUSUAL RASH Items with * indicate a potential emergency and should be followed up as soon as possible.  Feel free to call the clinic should you have any questions or concerns. The clinic phone number is [phone].  Please show the [facility] at check-in to the Emergency Department and triage nurse.

## 2020-09-24 NOTE — Progress Notes (Signed)
Per Dr. Alen Blew, ok to treat with Platelets 96, ANC 1.2, and total bili 1.8.

## 2020-09-25 ENCOUNTER — Ambulatory Visit
Admission: RE | Admit: 2020-09-25 | Discharge: 2020-09-25 | Disposition: A | Discharge status: Home / Self Care | Payer: Medicare HMO | Source: Ambulatory Visit | Attending: Radiation Oncology | Admitting: Radiation Oncology

## 2020-09-25 ENCOUNTER — Other Ambulatory Visit: Payer: Self-pay

## 2020-09-25 DIAGNOSIS — C3431 Malignant neoplasm of lower lobe, right bronchus or lung: Secondary | ICD-10-CM | POA: Diagnosis not present

## 2020-09-25 DIAGNOSIS — Z510 Encounter for antineoplastic radiation therapy: Secondary | ICD-10-CM | POA: Diagnosis not present

## 2020-09-25 DIAGNOSIS — Z87891 Personal history of nicotine dependence: Secondary | ICD-10-CM | POA: Diagnosis not present

## 2020-09-26 ENCOUNTER — Other Ambulatory Visit: Payer: Self-pay

## 2020-09-26 ENCOUNTER — Ambulatory Visit
Admission: RE | Admit: 2020-09-26 | Discharge: 2020-09-26 | Disposition: A | Discharge status: Home / Self Care | Payer: Medicare HMO | Source: Ambulatory Visit | Attending: Radiation Oncology | Admitting: Radiation Oncology

## 2020-09-26 DIAGNOSIS — C3431 Malignant neoplasm of lower lobe, right bronchus or lung: Secondary | ICD-10-CM | POA: Diagnosis not present

## 2020-09-26 DIAGNOSIS — Z510 Encounter for antineoplastic radiation therapy: Secondary | ICD-10-CM | POA: Diagnosis not present

## 2020-09-26 DIAGNOSIS — Z87891 Personal history of nicotine dependence: Secondary | ICD-10-CM | POA: Diagnosis not present

## 2020-09-27 ENCOUNTER — Ambulatory Visit
Admission: RE | Admit: 2020-09-27 | Discharge: 2020-09-27 | Disposition: A | Discharge status: Home / Self Care | Payer: Medicare HMO | Source: Ambulatory Visit | Attending: Radiation Oncology | Admitting: Radiation Oncology

## 2020-09-27 ENCOUNTER — Other Ambulatory Visit: Payer: Self-pay | Admitting: Radiation Oncology

## 2020-09-27 DIAGNOSIS — Z510 Encounter for antineoplastic radiation therapy: Secondary | ICD-10-CM | POA: Diagnosis not present

## 2020-09-27 DIAGNOSIS — C3431 Malignant neoplasm of lower lobe, right bronchus or lung: Secondary | ICD-10-CM | POA: Diagnosis not present

## 2020-09-27 DIAGNOSIS — Z87891 Personal history of nicotine dependence: Secondary | ICD-10-CM | POA: Diagnosis not present

## 2020-09-27 MED ORDER — SUCRALFATE 1 G PO TABS: 1.0000 g | ORAL_TABLET | Freq: Four times a day (QID) | ORAL | 1 refills | Status: DC

## 2020-09-30 ENCOUNTER — Ambulatory Visit
Admission: RE | Admit: 2020-09-30 | Discharge: 2020-09-30 | Disposition: A | Discharge status: Home / Self Care | Payer: Medicare HMO | Source: Ambulatory Visit | Attending: Radiation Oncology | Admitting: Radiation Oncology

## 2020-09-30 DIAGNOSIS — Z510 Encounter for antineoplastic radiation therapy: Secondary | ICD-10-CM | POA: Diagnosis not present

## 2020-09-30 DIAGNOSIS — C3431 Malignant neoplasm of lower lobe, right bronchus or lung: Secondary | ICD-10-CM | POA: Diagnosis not present

## 2020-09-30 DIAGNOSIS — Z87891 Personal history of nicotine dependence: Secondary | ICD-10-CM | POA: Diagnosis not present

## 2020-10-01 ENCOUNTER — Ambulatory Visit
Admission: RE | Admit: 2020-10-01 | Discharge: 2020-10-01 | Disposition: A | Discharge status: Home / Self Care | Payer: Medicare HMO | Source: Ambulatory Visit | Attending: Radiation Oncology | Admitting: Radiation Oncology

## 2020-10-01 ENCOUNTER — Encounter: Payer: Self-pay | Admitting: Radiation Oncology

## 2020-10-01 ENCOUNTER — Other Ambulatory Visit: Payer: Self-pay

## 2020-10-01 DIAGNOSIS — Z510 Encounter for antineoplastic radiation therapy: Secondary | ICD-10-CM | POA: Diagnosis not present

## 2020-10-01 DIAGNOSIS — C3431 Malignant neoplasm of lower lobe, right bronchus or lung: Secondary | ICD-10-CM | POA: Diagnosis not present

## 2020-10-01 DIAGNOSIS — Z87891 Personal history of nicotine dependence: Secondary | ICD-10-CM | POA: Diagnosis not present

## 2020-10-02 ENCOUNTER — Ambulatory Visit: Payer: Medicare HMO

## 2020-10-03 ENCOUNTER — Ambulatory Visit: Payer: Medicare HMO

## 2020-10-04 ENCOUNTER — Ambulatory Visit: Payer: Medicare HMO

## 2020-10-07 ENCOUNTER — Ambulatory Visit: Payer: Medicare HMO

## 2020-10-08 ENCOUNTER — Inpatient Hospital Stay: Payer: Medicare HMO | Attending: Oncology

## 2020-10-08 ENCOUNTER — Other Ambulatory Visit: Payer: Self-pay

## 2020-10-08 ENCOUNTER — Inpatient Hospital Stay: Payer: Medicare HMO | Admitting: Oncology

## 2020-10-08 ENCOUNTER — Ambulatory Visit: Payer: Medicare HMO

## 2020-10-08 DIAGNOSIS — Z923 Personal history of irradiation: Secondary | ICD-10-CM | POA: Diagnosis not present

## 2020-10-08 DIAGNOSIS — C3492 Malignant neoplasm of unspecified part of left bronchus or lung: Secondary | ICD-10-CM

## 2020-10-08 DIAGNOSIS — C884 Extranodal marginal zone B-cell lymphoma of mucosa-associated lymphoid tissue [MALT-lymphoma]: Secondary | ICD-10-CM | POA: Insufficient documentation

## 2020-10-08 DIAGNOSIS — C3490 Malignant neoplasm of unspecified part of unspecified bronchus or lung: Secondary | ICD-10-CM

## 2020-10-08 LAB — CMP (CANCER CENTER ONLY)
ALT: 27 U/L (ref 0–44)
AST: 23 U/L (ref 15–41)
Albumin: 3.7 g/dL (ref 3.5–5.0)
Alkaline Phosphatase: 86 U/L (ref 38–126)
Anion gap: 10 (ref 5–15)
BUN: 12 mg/dL (ref 8–23)
CO2: 30 mmol/L (ref 22–32)
Calcium: 9.4 mg/dL (ref 8.9–10.3)
Chloride: 98 mmol/L (ref 98–111)
Creatinine: 0.9 mg/dL (ref 0.61–1.24)
GFR, Estimated: >60 mL/min (ref >60)
Glucose, Bld: 130 mg/dL — ABNORMAL HIGH (ref 70–99)
Potassium: 3.3 mmol/L — ABNORMAL LOW (ref 3.5–5.1)
Sodium: 138 mmol/L (ref 135–145)
Total Bilirubin: 1.7 mg/dL — ABNORMAL HIGH (ref 0.3–1.2)
Total Protein: 7 g/dL (ref 6.5–8.1)

## 2020-10-08 LAB — CBC WITH DIFFERENTIAL (CANCER CENTER ONLY)
Abs Immature Granulocytes: 0.02 10*3/uL (ref 0.00–0.07)
Basophils Absolute: 0 10*3/uL (ref 0.0–0.1)
Basophils Relative: 1 %
Eosinophils Absolute: 0 10*3/uL (ref 0.0–0.5)
Eosinophils Relative: 1 %
HCT: 27.6 % — ABNORMAL LOW (ref 39.0–52.0)
Hemoglobin: 9.6 g/dL — ABNORMAL LOW (ref 13.0–17.0)
Immature Granulocytes: 1 %
Lymphocytes Relative: 27 %
Lymphs Abs: 0.5 10*3/uL — ABNORMAL LOW (ref 0.7–4.0)
MCH: 31.5 pg (ref 26.0–34.0)
MCHC: 34.8 g/dL (ref 30.0–36.0)
MCV: 90.5 fL (ref 80.0–100.0)
Monocytes Absolute: 0.3 10*3/uL (ref 0.1–1.0)
Monocytes Relative: 19 %
Neutro Abs: 0.9 10*3/uL — ABNORMAL LOW (ref 1.7–7.7)
Neutrophils Relative %: 51 %
Platelet Count: 169 10*3/uL (ref 150–400)
RBC: 3.05 MIL/uL — ABNORMAL LOW (ref 4.22–5.81)
RDW: 20.4 % — ABNORMAL HIGH (ref 11.5–15.5)
WBC Count: 1.8 10*3/uL — ABNORMAL LOW (ref 4.0–10.5)
nRBC: 0 % (ref 0.0–0.2)

## 2020-10-08 NOTE — Progress Notes (Signed)
Hematology and Oncology Follow Up Visit  Clifford [name] [id number] Mar 13, 1946 75 y.o. 10/08/2020 9:39 AM   Principle Diagnosis: 75 year old man with stage IIIA adenosquamous lung cancer diagnosed in February 2022.  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  Prior Therapy: 1. Status post pleurodesis and biopsy of the pleural-based nodule. 2. Received 6 cycles of CVP with rituximab. Therapy concluded in December 2007 after achieving remission at the time. 3.     Definitive therapy with radiation and weekly chemotherapy utilizing carboplatin and paclitaxel started on August 20, 2020.  He completed 6 cycles of therapy on September 24, 2020.  Current therapy: Active surveillance and under consideration for additional therapy.  Interim History:  Clifford [name] is here for return evaluation.  Since the last visit, he reports feeling reasonably well without any major plaints.  He does have some residual toxicity from treatment including hoarseness as well as fatigue.  He has also lost some weight although is eating slightly better at this time.  He still able to work part-time occasionally.  He denies any bone pain or pathological fractures.  He denies any recent hospitalization or illnesses.  His performance status and quality of life remained excellent.     Medications:  Current Outpatient Medications  Medication Sig Dispense Refill  . amLODipine (NORVASC) 5 MG tablet Take 5 mg by mouth daily.     Marland Kitchen aspirin 81 MG tablet Take 81 mg by mouth daily.    Marland Kitchen atorvastatin (LIPITOR) 10 MG tablet Take 10 mg by mouth daily.    . benzonatate (TESSALON) 100 MG capsule Take 100-200 mg by mouth 3 (three) times daily as needed for cough.    . cetirizine (ZYRTEC) 10 MG tablet Take 10 mg by mouth daily as needed for allergies.    . hydrochlorothiazide (HYDRODIURIL) 25 MG tablet Take 25 mg by  mouth daily.    Marland Kitchen HYDROcodone-homatropine (HYCODAN) 5-1.5 MG/5ML syrup Take 5 mLs by mouth every 6 (six) hours as needed for cough. 473 mL 0  . prochlorperazine (COMPAZINE) 10 MG tablet Take 1 tablet (10 mg total) by mouth every 6 (six) hours as needed for nausea or vomiting. 30 tablet 0  . sucralfate (CARAFATE) 1 g tablet Take 1 tablet (1 g total) by mouth 4 (four) times daily. Dissolve each tablet in 15 cc water before use. 120 tablet 1  . traMADol (ULTRAM) 50 MG tablet Take 50 mg by mouth daily as needed for pain.     No current facility-administered medications for this visit.    Allergies: No Known Allergies     Physical Exam:      ECOG: 0    General appearance: Alert, awake without any distress. Head: Atraumatic without abnormalities Oropharynx: Without any thrush or ulcers. Eyes: No scleral icterus. Lymph nodes: No lymphadenopathy noted in the cervical, supraclavicular, or axillary nodes Heart:regular rate and rhythm, without any murmurs or gallops.   Lung: Clear to auscultation without any rhonchi, wheezes or dullness to percussion. Abdomin: Soft, nontender without any shifting dullness or ascites. Musculoskeletal: No clubbing or cyanosis. Neurological: No motor or sensory deficits. Skin: No rashes or lesions.    Lab Results: Lab Results  Component Value Date   WBC 1.7 (L) 09/24/2020   HGB 10.4 (L) 09/24/2020   HCT 29.2 (L) 09/24/2020   MCV 85.4 09/24/2020   PLT 96 (L) 09/24/2020  Chemistry      Component Value Date/Time   NA 136 09/24/2020 1139   NA 144 10/21/2016 0808   K 3.2 (L) 09/24/2020 1139   K 4.0 10/21/2016 0808   CL 99 09/24/2020 1139   CL 109 (H) 10/26/2012 0841   CO2 27 09/24/2020 1139   CO2 29 10/21/2016 0808   BUN 14 09/24/2020 1139   BUN 15.3 10/21/2016 0808   CREATININE 0.87 09/24/2020 1139   CREATININE 1.0 10/21/2016 0808      Component Value Date/Time   CALCIUM 9.0 09/24/2020 1139   CALCIUM 9.3 10/21/2016 0808   ALKPHOS 73  09/24/2020 1139   ALKPHOS 53 10/21/2016 0808   AST 30 09/24/2020 1139   AST 27 10/21/2016 0808   ALT 40 09/24/2020 1139   ALT 37 10/21/2016 0808   BILITOT 1.8 (H) 09/24/2020 1139   BILITOT 1.09 10/21/2016 0808         Impression and Plan:  75 year old with:   1.  Stage IIIA adenosquamous lung cancer diagnosed February 2021.    He has completed definitive therapy with radiation and weekly chemotherapy without any major complications.  His disease status was updated at this time and treatment choices were reviewed.  The neck step is to update his staging scans including verifying his response.  Upon completing a imaging studies, only will consider treatment with consolidation immunotherapy.  Different salvage therapy option if he has progression of disease will be recommended.   2.  Marginal zone lymphoma: He continues to be without relapse after treatment complete response 2007.  3.  IV access: No issues with peripheral veins at this time.  Any future treatment will be given peripherally for the time being.  4.  Antiemetics: No residual nausea or vomiting at this time.  5.  Cough: Improved and Hycodan remains available to him.  6.  Goals of care and prognosis: His disease is incurable although aggressive measures are warranted at this time.  7.  Follow-up: In 6 weeks for repeat evaluation and repeat imaging studies.   30  minutes were spent on this encounter.  Time was dedicated to reviewing laboratory data, disease status update and future treatment options.   Zola Button, MD 5/3/20229:39 AM

## 2020-10-09 ENCOUNTER — Ambulatory Visit: Payer: Medicare HMO

## 2020-10-10 NOTE — Progress Notes (Signed)
  Patient Name: Clifford [name] MRN: [id number] DOB: Jun 07, 1946 Referring Physician: Zola Button (Profile Not Attached) Date of Service: 10/01/2020 [facility] [place]                                                        End Of Treatment Note  Diagnoses: C34.31-Malignant neoplasm of lower lobe, right bronchus or lung  Cancer Staging: Stage IIIa, cT4N0M0 vs. Stage IV NSCLC, adenosquamous carcinoma of the RLL.  Intent: Curative  Radiation Treatment Dates: 08/21/2020 through 10/01/2020 Site Technique Total Dose (Gy) Dose per Fx (Gy) Completed Fx Beam Energies  Lung, Right: Lung_Rt 3D 60/60 2 30/30 6X   Narrative: The patient tolerated radiation therapy relatively well. He did develop esophagitis related to his treatment.  Plan: The patient will receive a call in about one month from the radiation oncology department. He will continue follow up with Dr. Alen Blew as well. .  ________________________________________________    Carola Rhine, PAC

## 2020-10-30 DIAGNOSIS — Z0000 Encounter for general adult medical examination without abnormal findings: Secondary | ICD-10-CM | POA: Diagnosis not present

## 2020-10-30 DIAGNOSIS — R7309 Other abnormal glucose: Secondary | ICD-10-CM | POA: Diagnosis not present

## 2020-10-30 DIAGNOSIS — E785 Hyperlipidemia, unspecified: Secondary | ICD-10-CM | POA: Diagnosis not present

## 2020-10-30 DIAGNOSIS — R059 Cough, unspecified: Secondary | ICD-10-CM | POA: Diagnosis not present

## 2020-10-30 DIAGNOSIS — M79642 Pain in left hand: Secondary | ICD-10-CM | POA: Diagnosis not present

## 2020-10-30 DIAGNOSIS — I10 Essential (primary) hypertension: Secondary | ICD-10-CM | POA: Diagnosis not present

## 2020-10-30 DIAGNOSIS — C3490 Malignant neoplasm of unspecified part of unspecified bronchus or lung: Secondary | ICD-10-CM | POA: Diagnosis not present

## 2020-10-30 DIAGNOSIS — I700 Atherosclerosis of aorta: Secondary | ICD-10-CM | POA: Diagnosis not present

## 2020-10-30 DIAGNOSIS — Z8572 Personal history of non-Hodgkin lymphomas: Secondary | ICD-10-CM | POA: Diagnosis not present

## 2020-11-18 ENCOUNTER — Ambulatory Visit
Admission: RE | Admit: 2020-11-18 | Discharge: 2020-11-18 | Disposition: A | Discharge status: Home / Self Care | Payer: Medicare HMO | Source: Ambulatory Visit | Attending: Radiation Oncology | Admitting: Radiation Oncology

## 2020-11-18 DIAGNOSIS — C3431 Malignant neoplasm of lower lobe, right bronchus or lung: Secondary | ICD-10-CM | POA: Insufficient documentation

## 2020-11-18 NOTE — Progress Notes (Signed)
  Radiation Oncology         [phone] ________________________________  Name: Kamarion Zagami II MRN: [id number]  Date of Service: 11/18/2020  DOB: 12/25/1945  Post Treatment Telephone Note  Diagnosis:   Stage IIIa, cT4N0M0 vs. Stage IV NSCLC, adenosquamous carcinoma of the RLL.  Interval Since Last Radiation:  7 weeks   08/21/2020 through 10/01/2020 Site Technique Total Dose (Gy) Dose per Fx (Gy) Completed Fx Beam Energies  Lung, Right: Lung_Rt 3D 60/60 2 30/30 6X    Narrative:  The patient was contacted today for routine follow-up. During treatment he did very well with radiotherapy and did not have significant desquamation. He did have esophagitis, but per Dr. Hazeline Junker notes after completion of treatment, he's been feeling much better.   Impression/Plan: 1. Stage IIIa, cT4N0M0 vs. Stage IV NSCLC, adenosquamous carcinoma of the RLL. I was unable to reach the patient but left a message and discussed that we would be happy to continue to follow him as needed, but he will also continue to follow up with Dr. Alen Blew in medical oncology.      Carola Rhine, PAC

## 2020-11-19 ENCOUNTER — Other Ambulatory Visit: Payer: Self-pay

## 2020-11-19 ENCOUNTER — Inpatient Hospital Stay: Payer: Medicare HMO | Attending: Oncology

## 2020-11-19 ENCOUNTER — Ambulatory Visit (HOSPITAL_COMMUNITY)
Admission: RE | Admit: 2020-11-19 | Discharge: 2020-11-19 | Disposition: A | Discharge status: Home / Self Care | Payer: Medicare HMO | Source: Ambulatory Visit | Attending: Oncology | Admitting: Oncology

## 2020-11-19 DIAGNOSIS — C8580 Other specified types of non-Hodgkin lymphoma, unspecified site: Secondary | ICD-10-CM | POA: Insufficient documentation

## 2020-11-19 DIAGNOSIS — N200 Calculus of kidney: Secondary | ICD-10-CM | POA: Diagnosis not present

## 2020-11-19 DIAGNOSIS — Z79899 Other long term (current) drug therapy: Secondary | ICD-10-CM | POA: Diagnosis not present

## 2020-11-19 DIAGNOSIS — C3491 Malignant neoplasm of unspecified part of right bronchus or lung: Secondary | ICD-10-CM | POA: Insufficient documentation

## 2020-11-19 DIAGNOSIS — Z85118 Personal history of other malignant neoplasm of bronchus and lung: Secondary | ICD-10-CM | POA: Diagnosis not present

## 2020-11-19 DIAGNOSIS — J90 Pleural effusion, not elsewhere classified: Secondary | ICD-10-CM | POA: Diagnosis not present

## 2020-11-19 DIAGNOSIS — K769 Liver disease, unspecified: Secondary | ICD-10-CM | POA: Diagnosis not present

## 2020-11-19 DIAGNOSIS — C3492 Malignant neoplasm of unspecified part of left bronchus or lung: Secondary | ICD-10-CM | POA: Diagnosis not present

## 2020-11-19 DIAGNOSIS — I2510 Atherosclerotic heart disease of native coronary artery without angina pectoris: Secondary | ICD-10-CM | POA: Diagnosis not present

## 2020-11-19 DIAGNOSIS — C3490 Malignant neoplasm of unspecified part of unspecified bronchus or lung: Secondary | ICD-10-CM | POA: Diagnosis not present

## 2020-11-19 DIAGNOSIS — J929 Pleural plaque without asbestos: Secondary | ICD-10-CM | POA: Diagnosis not present

## 2020-11-19 DIAGNOSIS — Z5112 Encounter for antineoplastic immunotherapy: Secondary | ICD-10-CM | POA: Insufficient documentation

## 2020-11-19 DIAGNOSIS — K5730 Diverticulosis of large intestine without perforation or abscess without bleeding: Secondary | ICD-10-CM | POA: Diagnosis not present

## 2020-11-19 LAB — CBC WITH DIFFERENTIAL (CANCER CENTER ONLY)
Abs Immature Granulocytes: 0.01 10*3/uL (ref 0.00–0.07)
Basophils Absolute: 0 10*3/uL (ref 0.0–0.1)
Basophils Relative: 0 %
Eosinophils Absolute: 0.2 10*3/uL (ref 0.0–0.5)
Eosinophils Relative: 4 %
HCT: 36.6 % — ABNORMAL LOW (ref 39.0–52.0)
Hemoglobin: 12.4 g/dL — ABNORMAL LOW (ref 13.0–17.0)
Immature Granulocytes: 0 %
Lymphocytes Relative: 16 %
Lymphs Abs: 0.7 10*3/uL (ref 0.7–4.0)
MCH: 31 pg (ref 26.0–34.0)
MCHC: 33.9 g/dL (ref 30.0–36.0)
MCV: 91.5 fL (ref 80.0–100.0)
Monocytes Absolute: 0.3 10*3/uL (ref 0.1–1.0)
Monocytes Relative: 7 %
Neutro Abs: 3.3 10*3/uL (ref 1.7–7.7)
Neutrophils Relative %: 73 %
Platelet Count: 198 10*3/uL (ref 150–400)
RBC: 4 MIL/uL — ABNORMAL LOW (ref 4.22–5.81)
RDW: 13.5 % (ref 11.5–15.5)
WBC Count: 4.5 10*3/uL (ref 4.0–10.5)
nRBC: 0 % (ref 0.0–0.2)

## 2020-11-19 LAB — CMP (CANCER CENTER ONLY)
ALT: 18 U/L (ref 0–44)
AST: 18 U/L (ref 15–41)
Albumin: 3.3 g/dL — ABNORMAL LOW (ref 3.5–5.0)
Alkaline Phosphatase: 110 U/L (ref 38–126)
Anion gap: 12 (ref 5–15)
BUN: 13 mg/dL (ref 8–23)
CO2: 26 mmol/L (ref 22–32)
Calcium: 9.7 mg/dL (ref 8.9–10.3)
Chloride: 103 mmol/L (ref 98–111)
Creatinine: 0.83 mg/dL (ref 0.61–1.24)
GFR, Estimated: >60 mL/min (ref >60)
Glucose, Bld: 99 mg/dL (ref 70–99)
Potassium: 3.7 mmol/L (ref 3.5–5.1)
Sodium: 141 mmol/L (ref 135–145)
Total Bilirubin: 1 mg/dL (ref 0.3–1.2)
Total Protein: 7 g/dL (ref 6.5–8.1)

## 2020-11-19 MED ORDER — IOHEXOL 300 MG/ML  SOLN
100.0000 mL | Freq: Once | INTRAMUSCULAR | Status: AC | PRN
  Administered 2020-11-19: 100 mL via INTRAVENOUS

## 2020-11-26 ENCOUNTER — Other Ambulatory Visit: Payer: Self-pay | Admitting: Oncology

## 2020-11-26 ENCOUNTER — Inpatient Hospital Stay: Payer: Medicare HMO | Admitting: Oncology

## 2020-11-26 ENCOUNTER — Other Ambulatory Visit: Payer: Self-pay

## 2020-11-26 VITALS — BP 142/73 | HR 117 | Temp 96.2°F | Resp 19 | Ht 73.0 in | Wt 196.1 lb

## 2020-11-26 DIAGNOSIS — C3431 Malignant neoplasm of lower lobe, right bronchus or lung: Secondary | ICD-10-CM | POA: Diagnosis not present

## 2020-11-26 DIAGNOSIS — Z79899 Other long term (current) drug therapy: Secondary | ICD-10-CM | POA: Diagnosis not present

## 2020-11-26 DIAGNOSIS — Z5112 Encounter for antineoplastic immunotherapy: Secondary | ICD-10-CM | POA: Diagnosis not present

## 2020-11-26 DIAGNOSIS — C3491 Malignant neoplasm of unspecified part of right bronchus or lung: Secondary | ICD-10-CM | POA: Diagnosis not present

## 2020-11-26 DIAGNOSIS — C8580 Other specified types of non-Hodgkin lymphoma, unspecified site: Secondary | ICD-10-CM | POA: Diagnosis not present

## 2020-11-26 DIAGNOSIS — C3492 Malignant neoplasm of unspecified part of left bronchus or lung: Secondary | ICD-10-CM | POA: Diagnosis not present

## 2020-11-26 MED ORDER — HYDROCODONE BIT-HOMATROP MBR 5-1.5 MG/5ML PO SOLN: 5.0000 mL | Freq: Four times a day (QID) | ORAL | 0 refills | Status: DC | PRN

## 2020-11-26 NOTE — Progress Notes (Signed)
DISCONTINUE ON PATHWAY REGIMEN - Non-Small Cell Lung     Administer weekly:     Paclitaxel      Carboplatin   **Always confirm dose/schedule in your pharmacy ordering system**  REASON: Disease Progression PRIOR TREATMENT: [id number]: Carboplatin AUC=2 + Paclitaxel 45 mg/m2 Weekly During Radiation TREATMENT RESPONSE: Progressive Disease (PD)  START ON PATHWAY REGIMEN - Non-Small Cell Lung     A cycle is every 28 days:     Nivolumab   **Always confirm dose/schedule in your pharmacy ordering system**  Patient Characteristics: Stage IV Metastatic, Squamous, Molecular Analysis Completed, Alteration Present and Targeted Therapy Exhausted or EGFR Exon 20 Insertion or KRAS G12C Present, and No Prior Chemo/Immunotherapy or No Alteration Present, PS = 0, 1, Second Line -  Chemotherapy/Immunotherapy, No Prior PD-1/PD-L1  Inhibitor and Immunotherapy Candidate Therapeutic Status: Stage IV Metastatic Histology: Squamous Cell Molecular Analysis Results: No Alteration Present ECOG Performance Status: 1 Chemotherapy/Immunotherapy Line of Therapy: Second Line Chemotherapy/Immunotherapy Immunotherapy Candidate Status: Candidate for Immunotherapy Prior Immunotherapy Status: No Prior PD-1/PD-L1 Inhibitor Intent of Therapy: Non-Curative / Palliative Intent, Discussed with Patient

## 2020-11-26 NOTE — Progress Notes (Signed)
Hematology and Oncology Follow Up Visit  Evart Mcdonnell [id number] 1945-09-27 75 y.o. 11/26/2020 8:44 AM   Principle Diagnosis: 75 year old man with stage IV lung cancer developed in June 2022.  He presented with IIIA adenosquamous lung cancer diagnosed in February 2022.  He was found to have open PD-L1 tumor proportion score of 0 with no actionable mutation.   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  Prior Therapy: Status post pleurodesis and biopsy of the pleural-based nodule. Received 6 cycles of CVP with rituximab. Therapy concluded in December 2007 after achieving remission at the time. 3.     Definitive therapy with radiation and weekly chemotherapy utilizing carboplatin and paclitaxel started on August 20, 2020.  He completed 6 cycles of therapy on September 24, 2020.  Current therapy: Under consideration to start additional therapy.  Interim History:  Mr. Noboa is here for a follow-up visit.  Since the last visit, he completed radiation therapy concomitantly with chemo and has reported increased and his cough and fatigue.  He has reported to have no nausea, vomiting or abdominal pain.  He denies any shortness of breath at rest but does report exertional dyspnea.  He has not reported any recent hospitalizations or illnesses.  His appetite has declined slightly and of lost weight.  His performance status is also declined slightly and have cut down on his part-time working hours.     Medications: Updated on review. Current Outpatient Medications  Medication Sig Dispense Refill   amLODipine (NORVASC) 5 MG tablet Take 5 mg by mouth daily.      aspirin 81 MG tablet Take 81 mg by mouth daily.     atorvastatin (LIPITOR) 10 MG tablet Take 10 mg by mouth daily.     benzonatate (TESSALON) 100 MG capsule Take 100-200 mg by mouth 3 (three) times daily as needed for cough.     cetirizine (ZYRTEC) 10 MG tablet Take 10 mg by mouth  daily as needed for allergies.     hydrochlorothiazide (HYDRODIURIL) 25 MG tablet Take 25 mg by mouth daily.     HYDROcodone-homatropine (HYCODAN) 5-1.5 MG/5ML syrup Take 5 mLs by mouth every 6 (six) hours as needed for cough. 473 mL 0   prochlorperazine (COMPAZINE) 10 MG tablet Take 1 tablet (10 mg total) by mouth every 6 (six) hours as needed for nausea or vomiting. 30 tablet 0   sucralfate (CARAFATE) 1 g tablet Take 1 tablet (1 g total) by mouth 4 (four) times daily. Dissolve each tablet in 15 cc water before use. 120 tablet 1   traMADol (ULTRAM) 50 MG tablet Take 50 mg by mouth daily as needed for pain.     No current facility-administered medications for this visit.    Allergies: No Known Allergies     Physical Exam:   Blood pressure (!) 142/73, pulse (!) 117, temperature (!) 96.2 F (35.7 C), resp. rate 19, height 6' 1"  (1.854 m), weight 196 lb 1.6 oz (89 kg), SpO2 96 %.    ECOG: 1   General appearance: Comfortable appearing without any discomfort Head: Normocephalic without any trauma Oropharynx: Mucous membranes are moist and pink without any thrush or ulcers. Eyes: Pupils are equal and round reactive to light. Lymph nodes: No cervical, supraclavicular, inguinal or axillary lymphadenopathy.   Heart:regular rate and rhythm.  S1 and S2 without leg edema. Lung: Clear without any rhonchi or wheezes.  No dullness to percussion. Abdomin: Soft, nontender, nondistended with good bowel sounds.  No hepatosplenomegaly. Musculoskeletal: No joint deformity or effusion.  Full range of motion noted. Neurological: No deficits noted on motor, sensory and deep tendon reflex exam. Skin: No petechial rash or dryness.  Appeared moist.     Lab Results: Lab Results  Component Value Date   WBC 4.5 11/19/2020   HGB 12.4 (L) 11/19/2020   HCT 36.6 (L) 11/19/2020   MCV 91.5 11/19/2020   PLT 198 11/19/2020     Chemistry      Component Value Date/Time   NA 141 11/19/2020 1152   NA 144  10/21/2016 0808   K 3.7 11/19/2020 1152   K 4.0 10/21/2016 0808   CL 103 11/19/2020 1152   CL 109 (H) 10/26/2012 0841   CO2 26 11/19/2020 1152   CO2 29 10/21/2016 0808   BUN 13 11/19/2020 1152   BUN 15.3 10/21/2016 0808   CREATININE 0.83 11/19/2020 1152   CREATININE 1.0 10/21/2016 0808      Component Value Date/Time   CALCIUM 9.7 11/19/2020 1152   CALCIUM 9.3 10/21/2016 0808   ALKPHOS 110 11/19/2020 1152   ALKPHOS 53 10/21/2016 0808   AST 18 11/19/2020 1152   AST 27 10/21/2016 0808   ALT 18 11/19/2020 1152   ALT 37 10/21/2016 0808   BILITOT 1.0 11/19/2020 1152   BILITOT 1.09 10/21/2016 0808        IMPRESSION: 1. Today's study demonstrates mixed findings. Although there is a positive response to therapy in terms of regression of the largest mass in the right lower lobe, there has been a substantial increase in number and size of numerous pulmonary nodules which are now widespread throughout the lungs bilaterally, indicative of widespread metastatic disease. Small right pleural effusion is also noted, likely malignant. 2. No definite metastatic disease to the abdomen or pelvis confidently identified. 3. Cholelithiasis or biliary sludge, without evidence of acute cholecystitis. 4. Splenomegaly. 5. Colonic diverticulosis without evidence of acute diverticulitis at this time. 6. Aortic atherosclerosis, in addition to left main and 3 vessel coronary artery disease. 7. Additional incidental findings, as above.   Impression and Plan:  75 year old with:   1.  Stage IV lung cancer with initially presented with stage IIIA adenosquamous lung cancer diagnosed February 2021.    CT scan obtained on 11/19/2020 was personally reviewed and discussed with the patient.  His original tumor appears to have responded to therapy although he has developed bilateral pulmonary nodules indicating stage IV disease.  Treatment options moving forward were discussed.  Salvage therapy options  including gemcitabine combination chemotherapy versus immunotherapy were discussed.  After discussion today, we opted to proceed with monthly nivolumab and reassess his response in 3 months.  He is agreeable to proceed with this treatment.   2.  Marginal zone lymphoma: He continues to be in remission at this time without any evidence of residual disease.  3.  IV access: Peripheral veins are currently in use without any issues.  4.  Antiemetics: Compazine is available to him without any nausea or vomiting.  5.  Cough: Continues to be an issue I will refill his Hycodan.  6.  Goals of care and prognosis: Therapy remains palliative although aggressive measures are warranted given his recent pulm status.  7.  Follow-up: In the near future to start therapy.   30  minutes were dedicated to this visit.  The time was spent on reviewing imaging studies, treatment choices and outlining future plan of care.   Zola Button, MD 6/21/20228:44 AM

## 2020-12-03 ENCOUNTER — Inpatient Hospital Stay: Payer: Medicare HMO

## 2020-12-03 ENCOUNTER — Other Ambulatory Visit: Payer: Self-pay

## 2020-12-03 VITALS — BP 142/66 | HR 93 | Temp 98.3°F | Resp 16 | Wt 194.2 lb

## 2020-12-03 DIAGNOSIS — C3431 Malignant neoplasm of lower lobe, right bronchus or lung: Secondary | ICD-10-CM

## 2020-12-03 DIAGNOSIS — C8580 Other specified types of non-Hodgkin lymphoma, unspecified site: Secondary | ICD-10-CM | POA: Diagnosis not present

## 2020-12-03 DIAGNOSIS — Z79899 Other long term (current) drug therapy: Secondary | ICD-10-CM | POA: Diagnosis not present

## 2020-12-03 DIAGNOSIS — Z5112 Encounter for antineoplastic immunotherapy: Secondary | ICD-10-CM | POA: Diagnosis not present

## 2020-12-03 DIAGNOSIS — C3491 Malignant neoplasm of unspecified part of right bronchus or lung: Secondary | ICD-10-CM | POA: Diagnosis not present

## 2020-12-03 DIAGNOSIS — C3492 Malignant neoplasm of unspecified part of left bronchus or lung: Secondary | ICD-10-CM

## 2020-12-03 LAB — CBC WITH DIFFERENTIAL (CANCER CENTER ONLY)
Abs Immature Granulocytes: 0.01 10*3/uL (ref 0.00–0.07)
Basophils Absolute: 0 10*3/uL (ref 0.0–0.1)
Basophils Relative: 0 %
Eosinophils Absolute: 0.1 10*3/uL (ref 0.0–0.5)
Eosinophils Relative: 2 %
HCT: 36.6 % — ABNORMAL LOW (ref 39.0–52.0)
Hemoglobin: 12.7 g/dL — ABNORMAL LOW (ref 13.0–17.0)
Immature Granulocytes: 0 %
Lymphocytes Relative: 16 %
Lymphs Abs: 0.7 10*3/uL (ref 0.7–4.0)
MCH: 30.3 pg (ref 26.0–34.0)
MCHC: 34.7 g/dL (ref 30.0–36.0)
MCV: 87.4 fL (ref 80.0–100.0)
Monocytes Absolute: 0.3 10*3/uL (ref 0.1–1.0)
Monocytes Relative: 8 %
Neutro Abs: 3.2 10*3/uL (ref 1.7–7.7)
Neutrophils Relative %: 74 %
Platelet Count: 219 10*3/uL (ref 150–400)
RBC: 4.19 MIL/uL — ABNORMAL LOW (ref 4.22–5.81)
RDW: 13.1 % (ref 11.5–15.5)
WBC Count: 4.4 10*3/uL (ref 4.0–10.5)
nRBC: 0 % (ref 0.0–0.2)

## 2020-12-03 LAB — CMP (CANCER CENTER ONLY)
ALT: 16 U/L (ref 0–44)
AST: 15 U/L (ref 15–41)
Albumin: 3 g/dL — ABNORMAL LOW (ref 3.5–5.0)
Alkaline Phosphatase: 108 U/L (ref 38–126)
Anion gap: 11 (ref 5–15)
BUN: 13 mg/dL (ref 8–23)
CO2: 24 mmol/L (ref 22–32)
Calcium: 9.6 mg/dL (ref 8.9–10.3)
Chloride: 102 mmol/L (ref 98–111)
Creatinine: 0.88 mg/dL (ref 0.61–1.24)
GFR, Estimated: >60 mL/min (ref >60)
Glucose, Bld: 158 mg/dL — ABNORMAL HIGH (ref 70–99)
Potassium: 3.1 mmol/L — ABNORMAL LOW (ref 3.5–5.1)
Sodium: 137 mmol/L (ref 135–145)
Total Bilirubin: 0.8 mg/dL (ref 0.3–1.2)
Total Protein: 6.7 g/dL (ref 6.5–8.1)

## 2020-12-03 LAB — TSH: TSH: 1.277 u[IU]/mL (ref 0.320–4.118)

## 2020-12-03 MED ORDER — SODIUM CHLORIDE 0.9 % IV SOLN
480.0000 mg | Freq: Once | INTRAVENOUS | Status: AC
  Administered 2020-12-03: 480 mg via INTRAVENOUS
  Filled 2020-12-03: qty 48

## 2020-12-03 MED ORDER — SODIUM CHLORIDE 0.9 % IV SOLN
Freq: Once | INTRAVENOUS | Status: AC
  Filled 2020-12-03: qty 250

## 2020-12-03 NOTE — Patient Instructions (Signed)
[facility]  Discharge Instructions: Thank you for choosing [facility] to provide your oncology and hematology care.   If you have a lab appointment with the [facility], please go directly to the [facility] and check in at the registration area.   Wear comfortable clothing and clothing appropriate for easy access to any Portacath or PICC line.   We strive to give you quality time with your provider. You may need to reschedule your appointment if you arrive late (15 or more minutes).  Arriving late affects you and other patients whose appointments are after yours.  Also, if you miss three or more appointments without notifying the office, you may be dismissed from the clinic at the provider's discretion.      For prescription refill requests, have your pharmacy contact our office and allow 72 hours for refills to be completed.    Today you received the following chemotherapy and/or immunotherapy agents Nivolumab.   To help prevent nausea and vomiting after your treatment, we encourage you to take your nausea medication as directed.  BELOW ARE SYMPTOMS THAT SHOULD BE REPORTED IMMEDIATELY: *FEVER GREATER THAN 100.4 F (38 C) OR HIGHER *CHILLS OR SWEATING *NAUSEA AND VOMITING THAT IS NOT CONTROLLED WITH YOUR NAUSEA MEDICATION *UNUSUAL SHORTNESS OF BREATH *UNUSUAL BRUISING OR BLEEDING *URINARY PROBLEMS (pain or burning when urinating, or frequent urination) *BOWEL PROBLEMS (unusual diarrhea, constipation, pain near the anus) TENDERNESS IN MOUTH AND THROAT WITH OR WITHOUT PRESENCE OF ULCERS (sore throat, sores in mouth, or a toothache) UNUSUAL RASH, SWELLING OR PAIN  UNUSUAL VAGINAL DISCHARGE OR ITCHING   Items with * indicate a potential emergency and should be followed up as soon as possible or go to the Emergency Department if any problems should occur.  Please show the CHEMOTHERAPY ALERT CARD or IMMUNOTHERAPY ALERT CARD at check-in to the  Emergency Department and triage nurse.  Should you have questions after your visit or need to cancel or reschedule your appointment, please contact [facility]  Dept: [phone]  and follow the prompts.  Office hours are 8:00 a.m. to 4:30 p.m. Monday - Friday. Please note that voicemails left after 4:00 p.m. may not be returned until the following business day.  We are closed weekends and major holidays. You have access to a nurse at all times for urgent questions. Please call the main number to the clinic Dept: [phone] and follow the prompts.   For any non-urgent questions, you may also contact your provider using MyChart. We now offer e-Visits for anyone 68 and older to request care online for non-urgent symptoms. For details visit [email or web address].   Also download the MyChart app! Go to the app store, search "MyChart", open the app, select [facility], and log in with your MyChart username and password.  Due to Covid, a mask is required upon entering the hospital/clinic. If you do not have a mask, one will be given to you upon arrival. For doctor visits, patients may have 1 support person aged 73 or older with them. For treatment visits, patients cannot have anyone with them due to current Covid guidelines and our immunocompromised population.   Nivolumab injection What is this medication? NIVOLUMAB (nye VOL ue mab) is a monoclonal antibody. It treats certain types of cancer. Some of the cancers treated are colon cancer, head and neck cancer,Hodgkin lymphoma, lung cancer, and melanoma. This medicine may be used for other purposes; ask your health care provider orpharmacist  if you have questions. COMMON BRAND NAME(S): Opdivo What should I tell my care team before I take this medication? They need to know if you have any of these conditions: autoimmune diseases like Crohn's disease, ulcerative colitis, or lupus have had or planning to have an allogeneic  stem cell transplant (uses someone else's stem cells) history of chest radiation history of organ transplant nervous system problems like myasthenia gravis or Guillain-Barre syndrome an unusual or allergic reaction to nivolumab, other medicines, foods, dyes, or preservatives pregnant or trying to get pregnant breast-feeding How should I use this medication? This medicine is for infusion into a vein. It is given by a health careprofessional in a hospital or clinic setting. A special MedGuide will be given to you before each treatment. Be sure to readthis information carefully each time. Talk to your pediatrician regarding the use of this medicine in children. While this drug may be prescribed for children as young as 12 years for selectedconditions, precautions do apply. Overdosage: If you think you have taken too much of this medicine contact apoison control center or emergency room at once. NOTE: This medicine is only for you. Do not share this medicine with others. What if I miss a dose? It is important not to miss your dose. Call your doctor or health careprofessional if you are unable to keep an appointment. What may interact with this medication? Interactions have not been studied. This list may not describe all possible interactions. Give your health care provider a list of all the medicines, herbs, non-prescription drugs, or dietary supplements you use. Also tell them if you smoke, drink alcohol, or use illegaldrugs. Some items may interact with your medicine. What should I watch for while using this medication? This drug may make you feel generally unwell. Continue your course of treatmenteven though you feel ill unless your doctor tells you to stop. You may need blood work done while you are taking this medicine. Do not become pregnant while taking this medicine or for 5 months after stopping it. Women should inform their doctor if they wish to become pregnant or think they might be  pregnant. There is a potential for serious side effects to an unborn child. Talk to your health care professional or pharmacist for more information. Do not breast-feed an infant while taking this medicine orfor 5 months after stopping it. What side effects may I notice from receiving this medication? Side effects that you should report to your doctor or health care professionalas soon as possible: allergic reactions like skin rash, itching or hives, swelling of the face, lips, or tongue breathing problems blood in the urine bloody or watery diarrhea or black, tarry stools changes in emotions or moods changes in vision chest pain cough dizziness feeling faint or lightheaded, falls fever, chills headache with fever, neck stiffness, confusion, loss of memory, sensitivity to light, hallucination, loss of contact with reality, or seizures joint pain mouth sores redness, blistering, peeling or loosening of the skin, including inside the mouth severe muscle pain or weakness signs and symptoms of high blood sugar such as dizziness; dry mouth; dry skin; fruity breath; nausea; stomach pain; increased hunger or thirst; increased urination signs and symptoms of kidney injury like trouble passing urine or change in the amount of urine signs and symptoms of liver injury like dark yellow or brown urine; general ill feeling or flu-like symptoms; light-colored stools; loss of appetite; nausea; right upper belly pain; unusually weak or tired; yellowing of the eyes or skin  swelling of the ankles, feet, hands trouble passing urine or change in the amount of urine unusually weak or tired weight gain or loss Side effects that usually do not require medical attention (report to yourdoctor or health care professional if they continue or are bothersome): bone pain constipation decreased appetite diarrhea muscle pain nausea, vomiting tiredness This list may not describe all possible side effects. Call your  doctor for medical advice about side effects. You may report side effects to FDA at1-800-FDA-1088. Where should I keep my medication? This drug is given in a hospital or clinic and will not be stored at home. NOTE: This sheet is a summary. It may not cover all possible information. If you have questions about this medicine, talk to your doctor, pharmacist, orhealth care provider.  2022 Elsevier/Gold Standard (2019-09-27 10:08:25)

## 2020-12-30 ENCOUNTER — Inpatient Hospital Stay: Payer: Medicare HMO

## 2020-12-30 ENCOUNTER — Other Ambulatory Visit: Payer: Self-pay

## 2020-12-30 ENCOUNTER — Inpatient Hospital Stay: Payer: Medicare HMO | Attending: Oncology

## 2020-12-30 ENCOUNTER — Inpatient Hospital Stay (HOSPITAL_BASED_OUTPATIENT_CLINIC_OR_DEPARTMENT_OTHER): Payer: Medicare HMO | Admitting: Oncology

## 2020-12-30 VITALS — BP 140/69 | HR 97 | Temp 98.8°F | Resp 18 | Wt 190.8 lb

## 2020-12-30 DIAGNOSIS — Z5112 Encounter for antineoplastic immunotherapy: Secondary | ICD-10-CM | POA: Diagnosis present

## 2020-12-30 DIAGNOSIS — C3431 Malignant neoplasm of lower lobe, right bronchus or lung: Secondary | ICD-10-CM | POA: Diagnosis not present

## 2020-12-30 DIAGNOSIS — Z79899 Other long term (current) drug therapy: Secondary | ICD-10-CM | POA: Insufficient documentation

## 2020-12-30 DIAGNOSIS — R059 Cough, unspecified: Secondary | ICD-10-CM

## 2020-12-30 DIAGNOSIS — C3492 Malignant neoplasm of unspecified part of left bronchus or lung: Secondary | ICD-10-CM | POA: Diagnosis not present

## 2020-12-30 LAB — CMP (CANCER CENTER ONLY)
ALT: 11 U/L (ref 0–44)
AST: 12 U/L — ABNORMAL LOW (ref 15–41)
Albumin: 3 g/dL — ABNORMAL LOW (ref 3.5–5.0)
Alkaline Phosphatase: 100 U/L (ref 38–126)
Anion gap: 11 (ref 5–15)
BUN: 13 mg/dL (ref 8–23)
CO2: 26 mmol/L (ref 22–32)
Calcium: 9.4 mg/dL (ref 8.9–10.3)
Chloride: 100 mmol/L (ref 98–111)
Creatinine: 0.81 mg/dL (ref 0.61–1.24)
GFR, Estimated: >60 mL/min (ref >60)
Glucose, Bld: 117 mg/dL — ABNORMAL HIGH (ref 70–99)
Potassium: 3 mmol/L — ABNORMAL LOW (ref 3.5–5.1)
Sodium: 137 mmol/L (ref 135–145)
Total Bilirubin: 1 mg/dL (ref 0.3–1.2)
Total Protein: 6.6 g/dL (ref 6.5–8.1)

## 2020-12-30 LAB — CBC WITH DIFFERENTIAL (CANCER CENTER ONLY)
Abs Immature Granulocytes: 0.01 10*3/uL (ref 0.00–0.07)
Basophils Absolute: 0 10*3/uL (ref 0.0–0.1)
Basophils Relative: 1 %
Eosinophils Absolute: 0.1 10*3/uL (ref 0.0–0.5)
Eosinophils Relative: 3 %
HCT: 35 % — ABNORMAL LOW (ref 39.0–52.0)
Hemoglobin: 12.1 g/dL — ABNORMAL LOW (ref 13.0–17.0)
Immature Granulocytes: 0 %
Lymphocytes Relative: 14 %
Lymphs Abs: 0.6 10*3/uL — ABNORMAL LOW (ref 0.7–4.0)
MCH: 29.3 pg (ref 26.0–34.0)
MCHC: 34.6 g/dL (ref 30.0–36.0)
MCV: 84.7 fL (ref 80.0–100.0)
Monocytes Absolute: 0.4 10*3/uL (ref 0.1–1.0)
Monocytes Relative: 8 %
Neutro Abs: 3.3 10*3/uL (ref 1.7–7.7)
Neutrophils Relative %: 74 %
Platelet Count: 191 10*3/uL (ref 150–400)
RBC: 4.13 MIL/uL — ABNORMAL LOW (ref 4.22–5.81)
RDW: 13.3 % (ref 11.5–15.5)
WBC Count: 4.4 10*3/uL (ref 4.0–10.5)
nRBC: 0 % (ref 0.0–0.2)

## 2020-12-30 MED ORDER — HYDROCODONE BIT-HOMATROP MBR 5-1.5 MG/5ML PO SOLN: 5.0000 mL | Freq: Four times a day (QID) | ORAL | 0 refills | Status: DC | PRN

## 2020-12-30 MED ORDER — SODIUM CHLORIDE 0.9 % IV SOLN
480.0000 mg | Freq: Once | INTRAVENOUS | Status: AC
  Administered 2020-12-30: 480 mg via INTRAVENOUS
  Filled 2020-12-30: qty 48

## 2020-12-30 MED ORDER — SODIUM CHLORIDE 0.9 % IV SOLN
Freq: Once | INTRAVENOUS | Status: AC
  Filled 2020-12-30: qty 250

## 2020-12-30 NOTE — Patient Instructions (Signed)
[facility]  Discharge Instructions: Thank you for choosing [facility] to provide your oncology and hematology care.   If you have a lab appointment with the [facility], please go directly to the [facility] and check in at the registration area.   Wear comfortable clothing and clothing appropriate for easy access to any Portacath or PICC line.   We strive to give you quality time with your provider. You may need to reschedule your appointment if you arrive late (15 or more minutes).  Arriving late affects you and other patients whose appointments are after yours.  Also, if you miss three or more appointments without notifying the office, you may be dismissed from the clinic at the provider's discretion.      For prescription refill requests, have your pharmacy contact our office and allow 72 hours for refills to be completed.    Today you received the following chemotherapy and/or immunotherapy agents Nivolumab.   To help prevent nausea and vomiting after your treatment, we encourage you to take your nausea medication as directed.  BELOW ARE SYMPTOMS THAT SHOULD BE REPORTED IMMEDIATELY: *FEVER GREATER THAN 100.4 F (38 C) OR HIGHER *CHILLS OR SWEATING *NAUSEA AND VOMITING THAT IS NOT CONTROLLED WITH YOUR NAUSEA MEDICATION *UNUSUAL SHORTNESS OF BREATH *UNUSUAL BRUISING OR BLEEDING *URINARY PROBLEMS (pain or burning when urinating, or frequent urination) *BOWEL PROBLEMS (unusual diarrhea, constipation, pain near the anus) TENDERNESS IN MOUTH AND THROAT WITH OR WITHOUT PRESENCE OF ULCERS (sore throat, sores in mouth, or a toothache) UNUSUAL RASH, SWELLING OR PAIN  UNUSUAL VAGINAL DISCHARGE OR ITCHING   Items with * indicate a potential emergency and should be followed up as soon as possible or go to the Emergency Department if any problems should occur.  Please show the CHEMOTHERAPY ALERT CARD or IMMUNOTHERAPY ALERT CARD at check-in to the  Emergency Department and triage nurse.  Should you have questions after your visit or need to cancel or reschedule your appointment, please contact [facility]  Dept: [phone]  and follow the prompts.  Office hours are 8:00 a.m. to 4:30 p.m. Monday - Friday. Please note that voicemails left after 4:00 p.m. may not be returned until the following business day.  We are closed weekends and major holidays. You have access to a nurse at all times for urgent questions. Please call the main number to the clinic Dept: [phone] and follow the prompts.   For any non-urgent questions, you may also contact your provider using MyChart. We now offer e-Visits for anyone 77 and older to request care online for non-urgent symptoms. For details visit [email or web address].   Also download the MyChart app! Go to the app store, search "MyChart", open the app, select [facility], and log in with your MyChart username and password.  Due to Covid, a mask is required upon entering the hospital/clinic. If you do not have a mask, one will be given to you upon arrival. For doctor visits, patients may have 1 support person aged 15 or older with them. For treatment visits, patients cannot have anyone with them due to current Covid guidelines and our immunocompromised population.   Nivolumab injection What is this medication? NIVOLUMAB (nye VOL ue mab) is a monoclonal antibody. It treats certain types of cancer. Some of the cancers treated are colon cancer, head and neck cancer,Hodgkin lymphoma, lung cancer, and melanoma. This medicine may be used for other purposes; ask your health care provider orpharmacist  if you have questions. COMMON BRAND NAME(S): Opdivo What should I tell my care team before I take this medication? They need to know if you have any of these conditions: autoimmune diseases like Crohn's disease, ulcerative colitis, or lupus have had or planning to have an allogeneic  stem cell transplant (uses someone else's stem cells) history of chest radiation history of organ transplant nervous system problems like myasthenia gravis or Guillain-Barre syndrome an unusual or allergic reaction to nivolumab, other medicines, foods, dyes, or preservatives pregnant or trying to get pregnant breast-feeding How should I use this medication? This medicine is for infusion into a vein. It is given by a health careprofessional in a hospital or clinic setting. A special MedGuide will be given to you before each treatment. Be sure to readthis information carefully each time. Talk to your pediatrician regarding the use of this medicine in children. While this drug may be prescribed for children as young as 12 years for selectedconditions, precautions do apply. Overdosage: If you think you have taken too much of this medicine contact apoison control center or emergency room at once. NOTE: This medicine is only for you. Do not share this medicine with others. What if I miss a dose? It is important not to miss your dose. Call your doctor or health careprofessional if you are unable to keep an appointment. What may interact with this medication? Interactions have not been studied. This list may not describe all possible interactions. Give your health care provider a list of all the medicines, herbs, non-prescription drugs, or dietary supplements you use. Also tell them if you smoke, drink alcohol, or use illegaldrugs. Some items may interact with your medicine. What should I watch for while using this medication? This drug may make you feel generally unwell. Continue your course of treatmenteven though you feel ill unless your doctor tells you to stop. You may need blood work done while you are taking this medicine. Do not become pregnant while taking this medicine or for 5 months after stopping it. Women should inform their doctor if they wish to become pregnant or think they might be  pregnant. There is a potential for serious side effects to an unborn child. Talk to your health care professional or pharmacist for more information. Do not breast-feed an infant while taking this medicine orfor 5 months after stopping it. What side effects may I notice from receiving this medication? Side effects that you should report to your doctor or health care professionalas soon as possible: allergic reactions like skin rash, itching or hives, swelling of the face, lips, or tongue breathing problems blood in the urine bloody or watery diarrhea or black, tarry stools changes in emotions or moods changes in vision chest pain cough dizziness feeling faint or lightheaded, falls fever, chills headache with fever, neck stiffness, confusion, loss of memory, sensitivity to light, hallucination, loss of contact with reality, or seizures joint pain mouth sores redness, blistering, peeling or loosening of the skin, including inside the mouth severe muscle pain or weakness signs and symptoms of high blood sugar such as dizziness; dry mouth; dry skin; fruity breath; nausea; stomach pain; increased hunger or thirst; increased urination signs and symptoms of kidney injury like trouble passing urine or change in the amount of urine signs and symptoms of liver injury like dark yellow or brown urine; general ill feeling or flu-like symptoms; light-colored stools; loss of appetite; nausea; right upper belly pain; unusually weak or tired; yellowing of the eyes or skin  swelling of the ankles, feet, hands trouble passing urine or change in the amount of urine unusually weak or tired weight gain or loss Side effects that usually do not require medical attention (report to yourdoctor or health care professional if they continue or are bothersome): bone pain constipation decreased appetite diarrhea muscle pain nausea, vomiting tiredness This list may not describe all possible side effects. Call your  doctor for medical advice about side effects. You may report side effects to FDA at1-800-FDA-1088. Where should I keep my medication? This drug is given in a hospital or clinic and will not be stored at home. NOTE: This sheet is a summary. It may not cover all possible information. If you have questions about this medicine, talk to your doctor, pharmacist, orhealth care provider.  2022 Elsevier/Gold Standard (2019-09-27 10:08:25)

## 2020-12-30 NOTE — Progress Notes (Signed)
Hematology and Oncology Follow Up Visit  Clifford [name] [id number] 11/14/1945 75 y.o. 12/30/2020 8:03 AM   Principle Diagnosis: 75 year old man with non-small cell lung cancer diagnosed in February 2022.  He developed stage IV adenosquamous with additional pulmonary nodules but no distant metastasis.   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  Prior Therapy: Status post pleurodesis and biopsy of the pleural-based nodule. Received 6 cycles of CVP with rituximab. Therapy concluded in December 2007 after achieving remission at the time. 3.     Definitive therapy with radiation and weekly chemotherapy utilizing carboplatin and paclitaxel started on August 20, 2020.  He completed 6 cycles of therapy on September 24, 2020.  Current therapy: Nivolumab 480 mg monthly started on December 03, 2020 is here for second cycle of therapy.  Interim History:  Clifford [name] returns today for a follow-up evaluation.  Since the last visit, he received the first month of nivolumab infusion without any complications.  He denies any nausea, vomiting or abdominal pain.  He denies any skin rash or pruritus.  He continues to have issues with cough, congestion and occasional dyspnea.  His cough is productive and associated with postural changes.  He is still able to work part-time on the weekend but does not do anything else.  His appetite is down of lost close to 5 pounds in the last 4 weeks.  Overall he has lost 40 pounds since his cancer diagnosis.    Medications: Reviewed without changes. Current Outpatient Medications  Medication Sig Dispense Refill   amLODipine (NORVASC) 5 MG tablet Take 5 mg by mouth daily.      aspirin 81 MG tablet Take 81 mg by mouth daily.     atorvastatin (LIPITOR) 10 MG tablet Take 10 mg by mouth daily.     benzonatate (TESSALON) 100 MG capsule Take 100-200 mg by mouth 3 (three) times daily as needed for cough.     cetirizine  (ZYRTEC) 10 MG tablet Take 10 mg by mouth daily as needed for allergies.     hydrochlorothiazide (HYDRODIURIL) 25 MG tablet Take 25 mg by mouth daily.     HYDROcodone bit-homatropine (HYCODAN) 5-1.5 MG/5ML syrup Take 5 mLs by mouth every 6 (six) hours as needed for cough. 473 mL 0   HYDROcodone-homatropine (HYCODAN) 5-1.5 MG/5ML syrup Take 5 mLs by mouth every 6 (six) hours as needed for cough. 473 mL 0   prochlorperazine (COMPAZINE) 10 MG tablet Take 1 tablet (10 mg total) by mouth every 6 (six) hours as needed for nausea or vomiting. 30 tablet 0   sucralfate (CARAFATE) 1 g tablet Take 1 tablet (1 g total) by mouth 4 (four) times daily. Dissolve each tablet in 15 cc water before use. 120 tablet 1   traMADol (ULTRAM) 50 MG tablet Take 50 mg by mouth daily as needed for pain.     No current facility-administered medications for this visit.    Allergies: No Known Allergies     Physical Exam:    Blood pressure 140/69, pulse 97, temperature 98.8 F (37.1 C), temperature source Oral, resp. rate 18, weight 190 lb 12.8 oz (86.5 kg), SpO2 98 %.    ECOG: 1     General appearance: Alert, awake without any distress. Head: Atraumatic without abnormalities Oropharynx: Without any thrush or ulcers. Eyes: No scleral icterus. Lymph nodes: No lymphadenopathy noted in the cervical, supraclavicular, or axillary nodes Heart:regular rate and rhythm, without any murmurs or gallops.  Lung: Expiratory wheezes noted throughout his lung fields.  Good airflow bilaterally. Abdomin: Soft, nontender without any shifting dullness or ascites. Musculoskeletal: No clubbing or cyanosis. Neurological: No motor or sensory deficits. Skin: No rashes or lesions. Psychiatric: Mood and affect appeared normal.    Lab Results: Lab Results  Component Value Date   WBC 4.4 12/03/2020   HGB 12.7 (L) 12/03/2020   HCT 36.6 (L) 12/03/2020   MCV 87.4 12/03/2020   PLT 219 12/03/2020     Chemistry      Component  Value Date/Time   NA 137 12/03/2020 0735   NA 144 10/21/2016 0808   K 3.1 (L) 12/03/2020 0735   K 4.0 10/21/2016 0808   CL 102 12/03/2020 0735   CL 109 (H) 10/26/2012 0841   CO2 24 12/03/2020 0735   CO2 29 10/21/2016 0808   BUN 13 12/03/2020 0735   BUN 15.3 10/21/2016 0808   CREATININE 0.88 12/03/2020 0735   CREATININE 1.0 10/21/2016 0808      Component Value Date/Time   CALCIUM 9.6 12/03/2020 0735   CALCIUM 9.3 10/21/2016 0808   ALKPHOS 108 12/03/2020 0735   ALKPHOS 53 10/21/2016 0808   AST 15 12/03/2020 0735   AST 27 10/21/2016 0808   ALT 16 12/03/2020 0735   ALT 37 10/21/2016 0808   BILITOT 0.8 12/03/2020 0735   BILITOT 1.09 10/21/2016 0808         Impression and Plan:  75 year old with:   1.  Lung cancer diagnosed in February 2022 after presenting with stage IIIa disease.  He developed stage IV lung adenosquamous subtype with bilateral pulmonary nodules.    He is currently on nivolumab without any major complications.  Risks and benefits of continuing this treatment were discussed at this time.  The plan is to update his staging scans in 2 months.  Different salvage therapy options such as systemic chemotherapy utilizing gemcitabine combination among others were reviewed.  He is agreeable to continue.   2.  Marginal zone lymphoma: No evidence of relapsed disease at this time.  3.  IV access: No issues reported with the peripheral veins at this time.  4.  Antiemetics: No nausea or vomiting reported at this time.  Compazine is available to him.  5.  Pulmonary considerations: He continues to have productive cough with dyspnea.  I will refer to pulmonary medicine to help optimize his regimen he might require decongestion, inhalers among others.  Trial of gabapentin may also be required but I will defer that to pulmonary medicine to help with his symptoms.  6.  Goals of care and prognosis: His disease is incurable but aggressive measures are warranted given his  reasonable performance status.  7.  Follow-up: In 4 weeks for the next cycle of therapy.   30  minutes were spent on this encounter.  The time was dedicated to reviewing laboratory data, disease status update, treatment choices and outlining future plan of care.   Zola Button, MD 7/25/20228:03 AM

## 2021-01-22 ENCOUNTER — Other Ambulatory Visit: Payer: Self-pay | Admitting: Oncology

## 2021-01-22 MED ORDER — GABAPENTIN 300 MG PO CAPS: 300.0000 mg | ORAL_CAPSULE | Freq: Two times a day (BID) | ORAL | 3 refills | Status: DC

## 2021-01-28 ENCOUNTER — Other Ambulatory Visit: Payer: Self-pay | Admitting: *Deleted

## 2021-01-28 ENCOUNTER — Inpatient Hospital Stay (HOSPITAL_BASED_OUTPATIENT_CLINIC_OR_DEPARTMENT_OTHER): Payer: Medicare HMO | Admitting: Oncology

## 2021-01-28 ENCOUNTER — Inpatient Hospital Stay: Payer: Medicare HMO

## 2021-01-28 ENCOUNTER — Inpatient Hospital Stay: Payer: Medicare HMO | Attending: Oncology

## 2021-01-28 ENCOUNTER — Other Ambulatory Visit: Payer: Self-pay

## 2021-01-28 VITALS — BP 139/68 | HR 98 | Temp 98.4°F | Resp 18 | Wt 183.2 lb

## 2021-01-28 DIAGNOSIS — C3491 Malignant neoplasm of unspecified part of right bronchus or lung: Secondary | ICD-10-CM | POA: Insufficient documentation

## 2021-01-28 DIAGNOSIS — Z79899 Other long term (current) drug therapy: Secondary | ICD-10-CM | POA: Insufficient documentation

## 2021-01-28 DIAGNOSIS — C3492 Malignant neoplasm of unspecified part of left bronchus or lung: Secondary | ICD-10-CM

## 2021-01-28 DIAGNOSIS — C3431 Malignant neoplasm of lower lobe, right bronchus or lung: Secondary | ICD-10-CM

## 2021-01-28 DIAGNOSIS — C3490 Malignant neoplasm of unspecified part of unspecified bronchus or lung: Secondary | ICD-10-CM

## 2021-01-28 DIAGNOSIS — Z5112 Encounter for antineoplastic immunotherapy: Secondary | ICD-10-CM | POA: Diagnosis not present

## 2021-01-28 DIAGNOSIS — E876 Hypokalemia: Secondary | ICD-10-CM

## 2021-01-28 LAB — CBC WITH DIFFERENTIAL (CANCER CENTER ONLY)
Abs Immature Granulocytes: 0.01 10*3/uL (ref 0.00–0.07)
Basophils Absolute: 0 10*3/uL (ref 0.0–0.1)
Basophils Relative: 0 %
Eosinophils Absolute: 0.2 10*3/uL (ref 0.0–0.5)
Eosinophils Relative: 3 %
HCT: 37.4 % — ABNORMAL LOW (ref 39.0–52.0)
Hemoglobin: 12.9 g/dL — ABNORMAL LOW (ref 13.0–17.0)
Immature Granulocytes: 0 %
Lymphocytes Relative: 12 %
Lymphs Abs: 0.6 10*3/uL — ABNORMAL LOW (ref 0.7–4.0)
MCH: 28.7 pg (ref 26.0–34.0)
MCHC: 34.5 g/dL (ref 30.0–36.0)
MCV: 83.1 fL (ref 80.0–100.0)
Monocytes Absolute: 0.3 10*3/uL (ref 0.1–1.0)
Monocytes Relative: 7 %
Neutro Abs: 3.6 10*3/uL (ref 1.7–7.7)
Neutrophils Relative %: 78 %
Platelet Count: 208 10*3/uL (ref 150–400)
RBC: 4.5 MIL/uL (ref 4.22–5.81)
RDW: 14.4 % (ref 11.5–15.5)
WBC Count: 4.7 10*3/uL (ref 4.0–10.5)
nRBC: 0 % (ref 0.0–0.2)

## 2021-01-28 LAB — CMP (CANCER CENTER ONLY)
ALT: 13 U/L (ref 0–44)
AST: 12 U/L — ABNORMAL LOW (ref 15–41)
Albumin: 3.1 g/dL — ABNORMAL LOW (ref 3.5–5.0)
Alkaline Phosphatase: 105 U/L (ref 38–126)
Anion gap: 12 (ref 5–15)
BUN: 9 mg/dL (ref 8–23)
CO2: 28 mmol/L (ref 22–32)
Calcium: 9.4 mg/dL (ref 8.9–10.3)
Chloride: 97 mmol/L — ABNORMAL LOW (ref 98–111)
Creatinine: 0.79 mg/dL (ref 0.61–1.24)
GFR, Estimated: >60 mL/min (ref >60)
Glucose, Bld: 116 mg/dL — ABNORMAL HIGH (ref 70–99)
Potassium: 2.9 mmol/L — ABNORMAL LOW (ref 3.5–5.1)
Sodium: 137 mmol/L (ref 135–145)
Total Bilirubin: 1.1 mg/dL (ref 0.3–1.2)
Total Protein: 6.8 g/dL (ref 6.5–8.1)

## 2021-01-28 MED ORDER — HYDROCODONE BIT-HOMATROP MBR 5-1.5 MG/5ML PO SOLN: 5.0000 mL | Freq: Four times a day (QID) | ORAL | 0 refills | Status: DC | PRN

## 2021-01-28 MED ORDER — POTASSIUM CHLORIDE CRYS ER 20 MEQ PO TBCR: 20.0000 meq | EXTENDED_RELEASE_TABLET | Freq: Every day | ORAL | 0 refills | Status: DC

## 2021-01-28 MED ORDER — SODIUM CHLORIDE 0.9 % IV SOLN
480.0000 mg | Freq: Once | INTRAVENOUS | Status: AC
  Administered 2021-01-28: 480 mg via INTRAVENOUS
  Filled 2021-01-28: qty 48

## 2021-01-28 MED ORDER — SODIUM CHLORIDE 0.9 % IV SOLN: Freq: Once | INTRAVENOUS | Status: AC

## 2021-01-28 NOTE — Progress Notes (Signed)
Hematology and Oncology Follow Up Visit  Clifford [name] [id number] 1945-09-09 75 y.o. 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  He developed progression of disease with pulmonary nodules in June 2022.   Next generation sequencing showed no actionable mutation with a PD-L1 score of 0.   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  Prior Therapy: Status post pleurodesis and biopsy of the pleural-based nodule. Received 6 cycles of CVP with rituximab. Therapy concluded in December 2007 after achieving remission at the time. 3.     Definitive therapy with radiation and weekly chemotherapy utilizing carboplatin and paclitaxel started on August 20, 2020.  He completed 6 cycles of therapy on September 24, 2020.  Current therapy: Nivolumab 480 mg monthly started in June 2022.  He is here for cycle 3 of therapy.  Interim History:  Mr. Townley presents today for a follow-up visit.  Since the last visit, he reports no major changes in his health.  He continues to have cough with slight improvement with Neurontin that he started last week.  He feels that he is able to take a deeper breath although improvement has been minor.  He denies any nausea, vomiting or abdominal pain.  He denies any major appetite changes although he has lost more weight.  He denies vomiting bone pain or pathological fractures.    Medications: Updated on review. Current Outpatient Medications  Medication Sig Dispense Refill   amLODipine (NORVASC) 5 MG tablet Take 5 mg by mouth daily.      aspirin 81 MG tablet Take 81 mg by mouth daily.     atorvastatin (LIPITOR) 10 MG tablet Take 10 mg by mouth daily.     benzonatate (TESSALON) 100 MG capsule Take 100-200 mg by mouth 3 (three) times daily as needed for cough.     cetirizine (ZYRTEC) 10 MG tablet Take 10 mg by mouth daily as  needed for allergies.     gabapentin (NEURONTIN) 300 MG capsule Take 1 capsule (300 mg total) by mouth 2 (two) times daily. 60 capsule 3   hydrochlorothiazide (HYDRODIURIL) 25 MG tablet Take 25 mg by mouth daily.     HYDROcodone bit-homatropine (HYCODAN) 5-1.5 MG/5ML syrup Take 5 mLs by mouth every 6 (six) hours as needed for cough. 473 mL 0   HYDROcodone-homatropine (HYCODAN) 5-1.5 MG/5ML syrup Take 5 mLs by mouth every 6 (six) hours as needed for cough. 473 mL 0   prochlorperazine (COMPAZINE) 10 MG tablet Take 1 tablet (10 mg total) by mouth every 6 (six) hours as needed for nausea or vomiting. 30 tablet 0   sucralfate (CARAFATE) 1 g tablet Take 1 tablet (1 g total) by mouth 4 (four) times daily. Dissolve each tablet in 15 cc water before use. 120 tablet 1   traMADol (ULTRAM) 50 MG tablet Take 50 mg by mouth daily as needed for pain.     No current facility-administered medications for this visit.    Allergies: No Known Allergies     Physical Exam:    Blood pressure 139/68, pulse 98, temperature 98.4 F (36.9 C), resp. rate 18, weight 183 lb 4 oz (83.1 kg), SpO2 93 %.     ECOG: 1    General appearance: Comfortable appearing without any discomfort Head: Normocephalic without any trauma Oropharynx: Mucous membranes are moist and pink without any thrush or ulcers. Eyes: Pupils are equal and round  reactive to light. Lymph nodes: No cervical, supraclavicular, inguinal or axillary lymphadenopathy.   Heart:regular rate and rhythm.  S1 and S2 without leg edema. Lung: Clear without any rhonchi or wheezes.  No dullness to percussion. Abdomin: Soft, nontender, nondistended with good bowel sounds.  No hepatosplenomegaly. Musculoskeletal: No joint deformity or effusion.  Full range of motion noted. Neurological: No deficits noted on motor, sensory and deep tendon reflex exam. Skin: No petechial rash or dryness.  Appeared moist.      Lab Results: Lab Results  Component Value Date    WBC 4.4 12/30/2020   HGB 12.1 (L) 12/30/2020   HCT 35.0 (L) 12/30/2020   MCV 84.7 12/30/2020   PLT 191 12/30/2020     Chemistry      Component Value Date/Time   NA 137 12/30/2020 0754   NA 144 10/21/2016 0808   K 3.0 (L) 12/30/2020 0754   K 4.0 10/21/2016 0808   CL 100 12/30/2020 0754   CL 109 (H) 10/26/2012 0841   CO2 26 12/30/2020 0754   CO2 29 10/21/2016 0808   BUN 13 12/30/2020 0754   BUN 15.3 10/21/2016 0808   CREATININE 0.81 12/30/2020 0754   CREATININE 1.0 10/21/2016 0808      Component Value Date/Time   CALCIUM 9.4 12/30/2020 0754   CALCIUM 9.3 10/21/2016 0808   ALKPHOS 100 12/30/2020 0754   ALKPHOS 53 10/21/2016 0808   AST 12 (L) 12/30/2020 0754   AST 27 10/21/2016 0808   ALT 11 12/30/2020 0754   ALT 37 10/21/2016 0808   BILITOT 1.0 12/30/2020 0754   BILITOT 1.09 10/21/2016 0808         Impression and Plan:  75-year-old with:   1.  Stage IV lung cancer diagnosed in March 2022.  He was found to have adenosquamous subtype with bilateral pulmonary nodules.    His disease status was updated and treatment choices were reviewed at this time.  Risks and benefits of continuing nivolumab versus switching to multiagent systemic chemotherapy were reiterated.  Options such as gemcitabine combination chemotherapy were reviewed.  I recommended updating his staging scans in the near future after this current cycle of therapy and proceed with therapy accordingly.  He is agreeable to proceed at this time.   2.  Marginal zone lymphoma: Remains in remission without any evidence of relapse.  3.  IV access: Peripheral veins are currently in use without any issues.  4.  Antiemetics: No nausea or vomiting reported at this time.  Compazine is available to him.  5.  Cough and pulmonary considerations: Related to his malignancy and disease progression.  He was started on Neurontin in addition to cough suppressant.  I have asked him to increase her Neurontin to 303 times a day  with a pulmonary evaluation pending.  6.  Goals of care and prognosis: Therapy remains patent although aggressive measures are warranted given his reasonable performance status.  7.  Follow-up: In 1 month for repeat follow-up.   30  minutes were dedicated to this visit.  Time spent on reviewing laboratory data, disease status update, treatment choices and future plan of care discussion.   Firas [name], MD 8/23/20228:22 AM 

## 2021-01-28 NOTE — Patient Instructions (Signed)
[facility]   Discharge Instructions: Thank you for choosing [facility] to provide your oncology and hematology care.   If you have a lab appointment with the [facility], please go directly to the [facility] and check in at the registration area.   Wear comfortable clothing and clothing appropriate for easy access to any Portacath or PICC line.   We strive to give you quality time with your provider. You may need to reschedule your appointment if you arrive late (15 or more minutes).  Arriving late affects you and other patients whose appointments are after yours.  Also, if you miss three or more appointments without notifying the office, you may be dismissed from the clinic at the provider's discretion.      For prescription refill requests, have your pharmacy contact our office and allow 72 hours for refills to be completed.    Today you received the following chemotherapy and/or immunotherapy agents: Nivolumab (Opdivo)      To help prevent nausea and vomiting after your treatment, we encourage you to take your nausea medication as directed.  BELOW ARE SYMPTOMS THAT SHOULD BE REPORTED IMMEDIATELY: *FEVER GREATER THAN 100.4 F (38 C) OR HIGHER *CHILLS OR SWEATING *NAUSEA AND VOMITING THAT IS NOT CONTROLLED WITH YOUR NAUSEA MEDICATION *UNUSUAL SHORTNESS OF BREATH *UNUSUAL BRUISING OR BLEEDING *URINARY PROBLEMS (pain or burning when urinating, or frequent urination) *BOWEL PROBLEMS (unusual diarrhea, constipation, pain near the anus) TENDERNESS IN MOUTH AND THROAT WITH OR WITHOUT PRESENCE OF ULCERS (sore throat, sores in mouth, or a toothache) UNUSUAL RASH, SWELLING OR PAIN  UNUSUAL VAGINAL DISCHARGE OR ITCHING   Items with * indicate a potential emergency and should be followed up as soon as possible or go to the Emergency Department if any problems should occur.  Please show the CHEMOTHERAPY ALERT CARD or IMMUNOTHERAPY ALERT CARD at  check-in to the Emergency Department and triage nurse.  Should you have questions after your visit or need to cancel or reschedule your appointment, please contact [facility]  Dept: [phone]  and follow the prompts.  Office hours are 8:00 a.m. to 4:30 p.m. Monday - Friday. Please note that voicemails left after 4:00 p.m. may not be returned until the following business day.  We are closed weekends and major holidays. You have access to a nurse at all times for urgent questions. Please call the main number to the clinic Dept: [phone] and follow the prompts.   For any non-urgent questions, you may also contact your provider using MyChart. We now offer e-Visits for anyone 32 and older to request care online for non-urgent symptoms. For details visit [email or web address].   Also download the MyChart app! Go to the app store, search "MyChart", open the app, select [facility], and log in with your MyChart username and password.  Due to Covid, a mask is required upon entering the hospital/clinic. If you do not have a mask, one will be given to you upon arrival. For doctor visits, patients may have 1 support person aged 57 or older with them. For treatment visits, patients cannot have anyone with them due to current Covid guidelines and our immunocompromised population.

## 2021-01-30 ENCOUNTER — Telehealth: Payer: Self-pay | Admitting: *Deleted

## 2021-01-30 NOTE — Telephone Encounter (Signed)
Patient called with question about CT scan scheduled on 9/2. He was told to pick up the 2 bottles of contrast to drink prior to the CT. He said he never drank it in the past, he only had IV contrast.  Requested clarification from Dr. Alen Blew if he should drink PO contrast. Contacted patient with Dr. Hazeline Junker response: Oral contrast is needed for CT of Abdomen. Informed patient he can pick up contrast at reception desk at [facility]  Patient verbalized understanding.

## 2021-02-04 ENCOUNTER — Telehealth: Payer: Self-pay | Admitting: Oncology

## 2021-02-04 NOTE — Telephone Encounter (Signed)
Scheduled per 08/23 los, patient has been called and notified.

## 2021-02-07 ENCOUNTER — Ambulatory Visit (HOSPITAL_COMMUNITY)
Admission: RE | Admit: 2021-02-07 | Discharge: 2021-02-07 | Disposition: A | Discharge status: Home / Self Care | Payer: Medicare HMO | Source: Ambulatory Visit | Attending: Oncology | Admitting: Oncology

## 2021-02-07 ENCOUNTER — Other Ambulatory Visit: Payer: Self-pay

## 2021-02-07 DIAGNOSIS — C3490 Malignant neoplasm of unspecified part of unspecified bronchus or lung: Secondary | ICD-10-CM | POA: Insufficient documentation

## 2021-02-07 DIAGNOSIS — N400 Enlarged prostate without lower urinary tract symptoms: Secondary | ICD-10-CM | POA: Diagnosis not present

## 2021-02-07 DIAGNOSIS — I700 Atherosclerosis of aorta: Secondary | ICD-10-CM | POA: Diagnosis not present

## 2021-02-07 DIAGNOSIS — I2510 Atherosclerotic heart disease of native coronary artery without angina pectoris: Secondary | ICD-10-CM | POA: Diagnosis not present

## 2021-02-07 DIAGNOSIS — K8020 Calculus of gallbladder without cholecystitis without obstruction: Secondary | ICD-10-CM | POA: Diagnosis not present

## 2021-02-07 DIAGNOSIS — K3189 Other diseases of stomach and duodenum: Secondary | ICD-10-CM | POA: Diagnosis not present

## 2021-02-07 DIAGNOSIS — J90 Pleural effusion, not elsewhere classified: Secondary | ICD-10-CM | POA: Diagnosis not present

## 2021-02-07 DIAGNOSIS — N281 Cyst of kidney, acquired: Secondary | ICD-10-CM | POA: Diagnosis not present

## 2021-02-07 MED ORDER — IOHEXOL 350 MG/ML SOLN
80.0000 mL | Freq: Once | INTRAVENOUS | Status: AC | PRN
  Administered 2021-02-07: 80 mL via INTRAVENOUS

## 2021-02-11 ENCOUNTER — Other Ambulatory Visit: Payer: Self-pay | Admitting: Oncology

## 2021-02-11 NOTE — Progress Notes (Signed)
The results of the CT scan were personally reviewed and discussed with Clifford [name] today.  He has clear for his lung cancer and the switch of therapy is recommended.  Switching to gemcitabine and vinorelbine regiment will be implemented.  Complication associated with this therapy were reviewed today including nausea, vomiting and myelosuppression.  He has had a pulmonary medicine consultation this week which would be helpful to address his symptoms as well as the potential intervention of its indicated at this time to help his symptoms.  All his questions were answered today.

## 2021-02-11 NOTE — Progress Notes (Signed)
DISCONTINUE ON PATHWAY REGIMEN - Non-Small Cell Lung     A cycle is every 28 days:     Nivolumab   **Always confirm dose/schedule in your pharmacy ordering system**  REASON: Disease Progression PRIOR TREATMENT: [id number]: Nivolumab 480 mg q28 Days Until Progression or Unacceptable Toxicity TREATMENT RESPONSE: Progressive Disease (PD)  START OFF PATHWAY REGIMEN - Non-Small Cell Lung   [id number]:Gemcitabine 800 mg/m2 + Vinorelbine 15 mg/m2 + Liposomal Doxorubicin 10 mg/m2 q21 days (GVD post-transplant, dose-reduced):   A cycle is every 21 days:     Gemcitabine      Vinorelbine      Liposomal doxorubicin   **Always confirm dose/schedule in your pharmacy ordering system**  Patient Characteristics: Stage IV Metastatic, Squamous, Molecular Analysis Completed, No Alteration Present, PS = 0, 1, Not a Candidate for Immunotherapy Therapeutic Status: Stage IV Metastatic Histology: Squamous Cell Molecular Analysis Results: No Alteration Present ECOG Performance Status: 1 Immunotherapy Candidate Status: Not a Candidate for Immunotherapy Intent of Therapy: Non-Curative / Palliative Intent, Discussed with Patient

## 2021-02-12 ENCOUNTER — Ambulatory Visit (INDEPENDENT_AMBULATORY_CARE_PROVIDER_SITE_OTHER): Payer: Medicare HMO | Admitting: Emergency Medicine

## 2021-02-12 ENCOUNTER — Encounter: Payer: Self-pay | Admitting: Emergency Medicine

## 2021-02-12 ENCOUNTER — Telehealth: Payer: Self-pay | Admitting: Oncology

## 2021-02-12 ENCOUNTER — Other Ambulatory Visit: Payer: Self-pay

## 2021-02-12 DIAGNOSIS — R059 Cough, unspecified: Secondary | ICD-10-CM | POA: Diagnosis not present

## 2021-02-12 DIAGNOSIS — R0602 Shortness of breath: Secondary | ICD-10-CM

## 2021-02-12 DIAGNOSIS — C3431 Malignant neoplasm of lower lobe, right bronchus or lung: Secondary | ICD-10-CM

## 2021-02-12 MED ORDER — FLUTICASONE PROPIONATE 50 MCG/ACT NA SUSP: 2.0000 | Freq: Every day | NASAL | 2 refills | Status: AC

## 2021-02-12 MED ORDER — HYDROCOD POLST-CPM POLST ER 10-8 MG/5ML PO SUER: 5.0000 mL | Freq: Two times a day (BID) | ORAL | 0 refills | Status: DC | PRN

## 2021-02-12 MED ORDER — ALBUTEROL SULFATE HFA 108 (90 BASE) MCG/ACT IN AERS: 2.0000 | INHALATION_SPRAY | Freq: Four times a day (QID) | RESPIRATORY_TRACT | 6 refills | Status: AC | PRN

## 2021-02-12 MED ORDER — PREDNISONE 10 MG PO TABS: ORAL_TABLET | ORAL | 0 refills | Status: DC

## 2021-02-12 MED ORDER — AZITHROMYCIN 250 MG PO TABS: ORAL_TABLET | ORAL | 0 refills | Status: DC

## 2021-02-12 NOTE — Patient Instructions (Addendum)
We will try changing your Hycodan to Tussionex.  Start with 5 cc up to 12 hours to suppress your cough. Please take prednisone as directed until completely gone. Please take azithromycin as directed until completely gone. Start taking your Zyrtec every day until next visit. Try adding fluticasone nasal spray, 2 sprays each nostril once daily until next visit. We will do a trial of an albuterol inhaler to see if it impacts her cough and breathing.  You can use 2 puffs up to every 4 hours if needed for shortness of breath, wheezing, coughing spells. Follow with Dr. Alen Blew as planned.  We can talk about any possible benefit of draining the pleural fluid around the right lung or possibly even leaving the tube in place to allow intermittent drainage depending on your response to planned chemotherapy Follow with Dr Lamonte Sakai in 1 month or next available

## 2021-02-12 NOTE — Telephone Encounter (Signed)
Schedule per sch msg. Called and left msg. Mailed printout
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
## 2021-02-13 ENCOUNTER — Telehealth: Payer: Self-pay | Admitting: Emergency Medicine

## 2021-02-13 NOTE — Telephone Encounter (Signed)
Called and spoke with Patient.  Patient stated he was seen yesterday by Dr. Lamonte Sakai and prescribed several new medications. Patient stated Tussionex was priced $300 per Patient pharmacy and was told it was not cheaper with [facility].  Patient stated he did not get cough medication. Patient requested Tussionex to be cancelled. Patient stated yesterday afternoon he took prescribed azithromycin, Prednisone, Flonase, and albuterol. Patient stated he did not have to use anything for cough, because that seemed to decrease cough.  Patient stated this morning he took his regular meds, prednisone, azithromycin, and albuterol.   Patient stated 1 hour later he felt a heavy feeling in his chest, with deep breaths.  Patient stated his cough is still better, but is concerned with chest heaviness.    Message routed to Dr. Lamonte Sakai to advise

## 2021-02-13 NOTE — Telephone Encounter (Signed)
Have him use the hycodan as he needs it since tussionex is too expensive  Have him continue the current meds as he is taking them to see if the chest heaviness responds. He should call us tomorrow to let us know.

## 2021-02-13 NOTE — Telephone Encounter (Signed)
Called and spoke with Patient.  Dr. Agustina Caroli recommendations given. Understanding stated.  Tussionex prescription cancelled per Patient request.  Nothing further at this time.

## 2021-02-19 ENCOUNTER — Encounter: Payer: Self-pay | Admitting: Emergency Medicine

## 2021-02-19 DIAGNOSIS — R0602 Shortness of breath: Secondary | ICD-10-CM | POA: Insufficient documentation

## 2021-02-19 DIAGNOSIS — R059 Cough, unspecified: Secondary | ICD-10-CM | POA: Insufficient documentation

## 2021-02-19 NOTE — Assessment & Plan Note (Signed)
Progressing despite current nivolumab.  Sounds like he is probably going to start an alternative chemotherapy.

## 2021-02-19 NOTE — Assessment & Plan Note (Addendum)
That this is likely due to the progression of his lung cancer which is unfortunately no longer responding to nivolumab.  This may include parenchymal disease, airway disease or even his pleural effusion.  Reasonable to treat him as a possible exacerbation of COPD with antibiotics and prednisone to see if he gets benefit.  I will also give him a trial of albuterol to see if this helps cough, whether there is a component of obstructive lung disease contributing.  Some benefit from [facility], will try changing him to Tussionex for longer lasting relief, continue the Tessalon and gabapentin as ordered.  Also try adding back rhinitis regimen Zyrtec and nasal steroid.  We will try changing your Hycodan to Tussionex.  Start with 5 cc up to 12 hours to suppress your cough. Please take prednisone as directed until completely gone. Please take azithromycin as directed until completely gone. Start taking your Zyrtec every day until next visit. Try adding fluticasone nasal spray, 2 sprays each nostril once daily until next visit. We will do a trial of an albuterol inhaler to see if it impacts her cough and breathing.  You can use 2 puffs up to every 4 hours if needed for shortness of breath, wheezing, coughing spells. Follow with Dr Lamonte Sakai in 1 month or next available

## 2021-02-19 NOTE — Assessment & Plan Note (Signed)
Again likely associated with progression of his lung cancer although flaring obstructive lung disease is possible.  Also consider impact of his loculated pleural effusion.  It looks about the same on his most recent imaging but he is gotten subjective benefit in the past from drainage.  There may be a role here for Pleurx catheter placement.  I explained this to them today.

## 2021-02-20 ENCOUNTER — Other Ambulatory Visit: Payer: Self-pay | Admitting: Oncology

## 2021-02-20 DIAGNOSIS — C3431 Malignant neoplasm of lower lobe, right bronchus or lung: Secondary | ICD-10-CM

## 2021-02-20 MED ORDER — HYDROCODONE BIT-HOMATROP MBR 5-1.5 MG/5ML PO SOLN: 5.0000 mL | Freq: Four times a day (QID) | ORAL | 0 refills | Status: DC | PRN

## 2021-02-26 ENCOUNTER — Inpatient Hospital Stay: Payer: Medicare HMO | Attending: Oncology

## 2021-02-26 ENCOUNTER — Inpatient Hospital Stay: Payer: Medicare HMO | Admitting: Dietician

## 2021-02-26 ENCOUNTER — Inpatient Hospital Stay (HOSPITAL_BASED_OUTPATIENT_CLINIC_OR_DEPARTMENT_OTHER): Payer: Medicare HMO | Admitting: Oncology

## 2021-02-26 ENCOUNTER — Other Ambulatory Visit: Payer: Self-pay

## 2021-02-26 ENCOUNTER — Inpatient Hospital Stay: Payer: Medicare HMO

## 2021-02-26 VITALS — BP 133/68 | HR 89 | Temp 98.9°F | Resp 17 | Ht 71.0 in | Wt 184.6 lb

## 2021-02-26 DIAGNOSIS — Z5111 Encounter for antineoplastic chemotherapy: Secondary | ICD-10-CM | POA: Insufficient documentation

## 2021-02-26 DIAGNOSIS — Z79899 Other long term (current) drug therapy: Secondary | ICD-10-CM | POA: Insufficient documentation

## 2021-02-26 DIAGNOSIS — C3492 Malignant neoplasm of unspecified part of left bronchus or lung: Secondary | ICD-10-CM

## 2021-02-26 DIAGNOSIS — C3431 Malignant neoplasm of lower lobe, right bronchus or lung: Secondary | ICD-10-CM

## 2021-02-26 LAB — CMP (CANCER CENTER ONLY)
ALT: 12 U/L (ref 0–44)
AST: 14 U/L — ABNORMAL LOW (ref 15–41)
Albumin: 3.1 g/dL — ABNORMAL LOW (ref 3.5–5.0)
Alkaline Phosphatase: 97 U/L (ref 38–126)
Anion gap: 10 (ref 5–15)
BUN: 12 mg/dL (ref 8–23)
CO2: 26 mmol/L (ref 22–32)
Calcium: 9.3 mg/dL (ref 8.9–10.3)
Chloride: 102 mmol/L (ref 98–111)
Creatinine: 0.79 mg/dL (ref 0.61–1.24)
GFR, Estimated: >60 mL/min (ref >60)
Glucose, Bld: 95 mg/dL (ref 70–99)
Potassium: 3.4 mmol/L — ABNORMAL LOW (ref 3.5–5.1)
Sodium: 138 mmol/L (ref 135–145)
Total Bilirubin: 1.2 mg/dL (ref 0.3–1.2)
Total Protein: 6.5 g/dL (ref 6.5–8.1)

## 2021-02-26 LAB — CBC WITH DIFFERENTIAL (CANCER CENTER ONLY)
Abs Immature Granulocytes: 0.02 10*3/uL (ref 0.00–0.07)
Basophils Absolute: 0 10*3/uL (ref 0.0–0.1)
Basophils Relative: 0 %
Eosinophils Absolute: 0.2 10*3/uL (ref 0.0–0.5)
Eosinophils Relative: 3 %
HCT: 39.8 % (ref 39.0–52.0)
Hemoglobin: 13.2 g/dL (ref 13.0–17.0)
Immature Granulocytes: 0 %
Lymphocytes Relative: 10 %
Lymphs Abs: 0.6 10*3/uL — ABNORMAL LOW (ref 0.7–4.0)
MCH: 28.6 pg (ref 26.0–34.0)
MCHC: 33.2 g/dL (ref 30.0–36.0)
MCV: 86.1 fL (ref 80.0–100.0)
Monocytes Absolute: 0.5 10*3/uL (ref 0.1–1.0)
Monocytes Relative: 8 %
Neutro Abs: 4.6 10*3/uL (ref 1.7–7.7)
Neutrophils Relative %: 79 %
Platelet Count: 155 10*3/uL (ref 150–400)
RBC: 4.62 MIL/uL (ref 4.22–5.81)
RDW: 15.9 % — ABNORMAL HIGH (ref 11.5–15.5)
WBC Count: 5.8 10*3/uL (ref 4.0–10.5)
nRBC: 0 % (ref 0.0–0.2)

## 2021-02-26 MED ORDER — SODIUM CHLORIDE 0.9 % IV SOLN
1000.0000 mg/m2 | Freq: Once | INTRAVENOUS | Status: AC
  Administered 2021-02-26: 2052 mg via INTRAVENOUS
  Filled 2021-02-26: qty 53.97

## 2021-02-26 MED ORDER — SODIUM CHLORIDE 0.9 % IV SOLN: Freq: Once | INTRAVENOUS | Status: AC

## 2021-02-26 MED ORDER — VINORELBINE TARTRATE CHEMO INJECTION 50 MG/5ML
24.0000 mg/m2 | Freq: Once | INTRAVENOUS | Status: AC
  Administered 2021-02-26: 50 mg via INTRAVENOUS
  Filled 2021-02-26: qty 5

## 2021-02-26 MED ORDER — PROCHLORPERAZINE MALEATE 10 MG PO TABS
10.0000 mg | ORAL_TABLET | Freq: Once | ORAL | Status: AC
  Administered 2021-02-26: 10 mg via ORAL
  Filled 2021-02-26: qty 1

## 2021-02-26 NOTE — Progress Notes (Signed)
Hematology and Oncology Follow Up Visit  Clifford [name] [id number] March 30, 1946 75 y.o. 02/26/2021 7:52 AM   Principle Diagnosis: 75 year old man with lung cancer diagnosed in March 2022.  He developed stage IV adenosquamous  with pulmonary nodules, no actionable mutation with a PD-L1 score of 0 noted in June 2022.   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  Prior Therapy: Status post pleurodesis and biopsy of the pleural-based nodule. Received 6 cycles of CVP with rituximab. Therapy concluded in December 2007 after achieving remission at the time. 3.     Definitive therapy with radiation and weekly chemotherapy utilizing carboplatin and paclitaxel started on August 20, 2020.  He completed 6 cycles of therapy on September 24, 2020. 4.   Nivolumab 480 mg monthly started in June 2022.  He completed 3 cycles of therapy in August 2022 with progression of disease.  Current therapy: Gemcitabine 1000 mg per metered square with [name] being 25 mg per metered square day 1, gemcitabine 1000 mg per metered squared day 8.  Day 1 of cycle 1 is scheduled to be today on 02/26/2021.  Interim History:  Clifford [name] is here for repeat evaluation.  Since the last visit, he reports no major changes in his health.  His cough has improved slightly since the last visit.  He was evaluated by Dr. Lamonte Sakai and was given a course of azithromycin and prednisone with improvement in his productive cough.  He denies any nausea, vomiting or abdominal pain.  He is eating better after completing prednisone course and has gained some weight.    Medications: Updated on review. Current Outpatient Medications  Medication Sig Dispense Refill   albuterol (VENTOLIN HFA) 108 (90 Base) MCG/ACT inhaler Inhale 2 puffs into the lungs every 6 (six) hours as needed for wheezing or shortness of breath. 8 g 6   amLODipine (NORVASC) 5 MG tablet Take 5 mg by mouth daily.      aspirin  81 MG tablet Take 81 mg by mouth daily.     atorvastatin (LIPITOR) 10 MG tablet Take 10 mg by mouth daily.     azithromycin (ZITHROMAX Z-PAK) 250 MG tablet Take 2 tabs today, then 1 tab until gone 6 each 0   benzonatate (TESSALON) 100 MG capsule Take 100-200 mg by mouth 3 (three) times daily as needed for cough. (Patient not taking: Reported on 02/12/2021)     cetirizine (ZYRTEC) 10 MG tablet Take 10 mg by mouth daily as needed for allergies.     chlorpheniramine-HYDROcodone (TUSSIONEX PENNKINETIC ER) 10-8 MG/5ML SUER Take 5 mLs by mouth every 12 (twelve) hours as needed for cough. 473 mL 0   fluticasone (FLONASE) 50 MCG/ACT nasal spray Place 2 sprays into both nostrils daily. 16 g 2   gabapentin (NEURONTIN) 300 MG capsule Take 1 capsule (300 mg total) by mouth 2 (two) times daily. 60 capsule 3   hydrochlorothiazide (HYDRODIURIL) 25 MG tablet Take 25 mg by mouth daily.     HYDROcodone bit-homatropine (HYCODAN) 5-1.5 MG/5ML syrup Take 5 mLs by mouth every 6 (six) hours as needed for cough. 473 mL 0   HYDROcodone-homatropine (HYCODAN) 5-1.5 MG/5ML syrup Take 5 mLs by mouth every 6 (six) hours as needed for cough. 473 mL 0   potassium chloride SA (KLOR-CON) 20 MEQ tablet Take 1 tablet (20 mEq total) by mouth daily for 14 days. 14 tablet 0   predniSONE (DELTASONE) 10 MG tablet Take 4 tablets X 3 days, 3 tabs  X 3 days, 2 tabs x 3 days, 1 tab x 3 days 30 tablet 0   prochlorperazine (COMPAZINE) 10 MG tablet Take 1 tablet (10 mg total) by mouth every 6 (six) hours as needed for nausea or vomiting. (Patient not taking: Reported on 02/12/2021) 30 tablet 0   sucralfate (CARAFATE) 1 g tablet Take 1 tablet (1 g total) by mouth 4 (four) times daily. Dissolve each tablet in 15 cc water before use. (Patient not taking: Reported on 02/12/2021) 120 tablet 1   traMADol (ULTRAM) 50 MG tablet Take 50 mg by mouth daily as needed for pain.     No current facility-administered medications for this visit.    Allergies: No  Known Allergies     Physical Exam:    Blood pressure 133/68, pulse 89, temperature 98.9 F (37.2 C), temperature source Oral, resp. rate 17, height 5' 11"  (1.803 m), weight 184 lb 9.6 oz (83.7 kg), SpO2 96 %.      ECOG: 1   General appearance: Alert, awake without any distress. Head: Atraumatic without abnormalities Oropharynx: Without any thrush or ulcers. Eyes: No scleral icterus. Lymph nodes: No lymphadenopathy noted in the cervical, supraclavicular, or axillary nodes Heart:regular rate and rhythm, without any murmurs or gallops.   Lung: Clear to auscultation without any rhonchi, wheezes or dullness to percussion. Abdomin: Soft, nontender without any shifting dullness or ascites. Musculoskeletal: No clubbing or cyanosis. Neurological: No motor or sensory deficits. Skin: No rashes or lesions.     Lab Results: Lab Results  Component Value Date   WBC 4.7 01/28/2021   HGB 12.9 (L) 01/28/2021   HCT 37.4 (L) 01/28/2021   MCV 83.1 01/28/2021   PLT 208 01/28/2021     Chemistry      Component Value Date/Time   NA 137 01/28/2021 0827   NA 144 10/21/2016 0808   K 2.9 (L) 01/28/2021 0827   K 4.0 10/21/2016 0808   CL 97 (L) 01/28/2021 0827   CL 109 (H) 10/26/2012 0841   CO2 28 01/28/2021 0827   CO2 29 10/21/2016 0808   BUN 9 01/28/2021 0827   BUN 15.3 10/21/2016 0808   CREATININE 0.79 01/28/2021 0827   CREATININE 1.0 10/21/2016 0808      Component Value Date/Time   CALCIUM 9.4 01/28/2021 0827   CALCIUM 9.3 10/21/2016 0808   ALKPHOS 105 01/28/2021 0827   ALKPHOS 53 10/21/2016 0808   AST 12 (L) 01/28/2021 0827   AST 27 10/21/2016 0808   ALT 13 01/28/2021 0827   ALT 37 10/21/2016 0808   BILITOT 1.1 01/28/2021 0827   BILITOT 1.09 10/21/2016 0808      IMPRESSION: 1. Marked interval progression of bilateral pulmonary metastases with progression of previously existing nodules and innumerable new bilateral pulmonary nodules visible on today's study. 2. New  dense collapse/consolidative opacity in the parahilar right lung likely a combination of radiation fibrosis and treated neoplasm. 3. Progressive small to moderate right pleural effusion with anterolateral loculation. 4. Marked wall thickening in the pyloric region, nonspecific. This may be related to peristalsis. 5. 15 mm nodular focus of soft tissue attenuation in the descending duodenum. Not definitely visible on the prior study. This may reflect food material although mucosal lesion can not be excluded. Attention on follow-up recommended. Upper GI series or upper endoscopy could be used to further evaluate in the short-term as clinically warranted. 6. Cholelithiasis. 7. Prostatomegaly. 8. Aortic Atherosclerosis (ICD10-I70.0).     Impression and Plan:  75 year old with:   1.  Lung cancer diagnosed in March 2022.  He developed stage IV with enlarging pulmonary nodules subsequently.   CT scan obtained on February 07, 2021 showed progression of disease with enlarging pulmonary nodules and pleural effusion which was discussed today again.  Salvage chemotherapy remains his best option and complications associated with this therapy were discussed.  His complication including nausea, vomiting, myelosuppression, neutropenia and possible sepsis were reiterated.  He is agreeable to continue at this time and the plan to update his staging scan after cycle 3.  2.  Marginal zone lymphoma: No evidence of relapsed disease at this time.  3.  IV access: Risks and benefits of Port-A-Cath insertion were discussed at this time.  Potential complications including bleeding, thrombosis among others were reviewed.  He has deferred this option and prefers peripheral veins at this time.  4.  Antiemetics: Compazine is available to him without any nausea or vomiting.   5.  Cough and pulmonary considerations: I appreciate the input from Dr. Lamonte Sakai and pulmonary medicine.  He was prescribed a course of  prednisone and azithromycin as well as started Zyrtec.  6.  Goals of care and prognosis: His disease is incurable although aggressive measures are warranted given his excellent performance status.  7.  Follow-up: He will return in 1 week to complete cycle 1 and in 3 weeks for the start of cycle 2 of therapy.   30  minutes were spent on this encounter.  Time was dedicated to reviewing laboratory data, disease status update, treatment choices complication related to therapy.   Zola Button, MD 9/21/20227:52 AM

## 2021-02-26 NOTE — Progress Notes (Signed)
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 He developed progression of disease with pulmonary nodules June 2022. Patient completed concurrent radiation and weekly chemotherapy 09/24/20. He is currently receiving nivolumab/gemzar.   Met with patient during infusion. He reports his appetite has improved and is eating well. Patient reports he has been feeling "better" recently, has more energy and has been active. Patient reports playing golf again, walks 3-4 holes then he is tired. Yesterday he recalls [facility] protein bar, donut, goldfish, BLT with chips drank 2 Gatorade, boost and lots of water. Patient reports history of myeloma (2006), says he gained 60 lbs during treatment. Patient feels good with current weight. He denies nutrition impact symptoms, other than chronic constipation. Patient is taking Senokot-S daily, reports this works for him.  Nutrition Focused Physical Exam: deferred   Medications: Gabapentin, Hycodan, Klor-con, Tramadol   Labs: K 3.4  Anthropometrics: Weights have decreased 12 lbs (6.1%) in 3 months which is insignificant for time frame. Weight 184  lb 6.4 oz today increased from 182 lb 6.4 oz on 9/7 and 183 lb 4 oz on 8/23  Height: 5'11" Weight: 184 lb 6.4 oz UBW: 227 lb (few years ago per pt) BMI: 25.75   NUTRITION DIAGNOSIS: No nutrition diagnosis at this time   INTERVENTION:  Encouraged on importance of adequate calorie and protein energy intake to maintain weights, strength, nutrition Encouraged small frequent meals and snacks that are high in calories and protein  Continue drinking Boost supplement daily, recommended switching to Boost Plus/equivalent for added calories - coupons provided Discussed strategies for constipation - handout provided  Contact information provided    MONITORING, EVALUATION, GOAL: patient will tolerate adequate calories  and protein to promote weight stability   Next Visit: None scheduled at this time, patient encouraged to contact with questions or concerns

## 2021-02-26 NOTE — Patient Instructions (Signed)
[facility]  Discharge Instructions: Thank you for choosing [facility] to provide your oncology and hematology care.   If you have a lab appointment with the [facility], please go directly to the [facility] and check in at the registration area.   Wear comfortable clothing and clothing appropriate for easy access to any Portacath or PICC line.   We strive to give you quality time with your provider. You may need to reschedule your appointment if you arrive late (15 or more minutes).  Arriving late affects you and other patients whose appointments are after yours.  Also, if you miss three or more appointments without notifying the office, you may be dismissed from the clinic at the provider's discretion.      For prescription refill requests, have your pharmacy contact our office and allow 72 hours for refills to be completed.    Today you received the following chemotherapy and/or immunotherapy agents: vinorelbine and gemcitabine.      To help prevent nausea and vomiting after your treatment, we encourage you to take your nausea medication as directed.  BELOW ARE SYMPTOMS THAT SHOULD BE REPORTED IMMEDIATELY: *FEVER GREATER THAN 100.4 F (38 C) OR HIGHER *CHILLS OR SWEATING *NAUSEA AND VOMITING THAT IS NOT CONTROLLED WITH YOUR NAUSEA MEDICATION *UNUSUAL SHORTNESS OF BREATH *UNUSUAL BRUISING OR BLEEDING *URINARY PROBLEMS (pain or burning when urinating, or frequent urination) *BOWEL PROBLEMS (unusual diarrhea, constipation, pain near the anus) TENDERNESS IN MOUTH AND THROAT WITH OR WITHOUT PRESENCE OF ULCERS (sore throat, sores in mouth, or a toothache) UNUSUAL RASH, SWELLING OR PAIN  UNUSUAL VAGINAL DISCHARGE OR ITCHING   Items with * indicate a potential emergency and should be followed up as soon as possible or go to the Emergency Department if any problems should occur.  Please show the CHEMOTHERAPY ALERT CARD or IMMUNOTHERAPY ALERT  CARD at check-in to the Emergency Department and triage nurse.  Should you have questions after your visit or need to cancel or reschedule your appointment, please contact [facility]  Dept: [phone]  and follow the prompts.  Office hours are 8:00 a.m. to 4:30 p.m. Monday - Friday. Please note that voicemails left after 4:00 p.m. may not be returned until the following business day.  We are closed weekends and major holidays. You have access to a nurse at all times for urgent questions. Please call the main number to the clinic Dept: [phone] and follow the prompts.   For any non-urgent questions, you may also contact your provider using MyChart. We now offer e-Visits for anyone 69 and older to request care online for non-urgent symptoms. For details visit [email or web address].   Also download the MyChart app! Go to the app store, search "MyChart", open the app, select [facility], and log in with your MyChart username and password.  Due to Covid, a mask is required upon entering the hospital/clinic. If you do not have a mask, one will be given to you upon arrival. For doctor visits, patients may have 1 support person aged 27 or older with them. For treatment visits, patients cannot have anyone with them due to current Covid guidelines and our immunocompromised population.   Vinorelbine injection What is this medication? VINORELBINE (vi NOR el been) is a chemotherapy drug. It targets fast dividing cells, like cancer cells, and causes these cells to die. This medicine is used to treat lung cancer. This medicine may be used for other purposes; ask your  health care provider or pharmacist if you have questions. COMMON BRAND NAME(S): Navelbine What should I tell my care team before I take this medication? They need to know if you have any of these conditions: blockage in your bowel liver disease low blood counts, like low white cell, platelet, or red cell  counts lung or breathing disease, like asthma tingling of the fingers or toes, or other nerve disorder an unusual or allergic reaction to vinorelbine, other chemotherapy agents, other medicines, foods, dyes, or preservatives pregnant or trying to get pregnant breast-feeding How should I use this medication? This medicine is for infusion into a vein. It is given by a health care provider in a hospital or clinic setting. Talk to your pediatrician regarding the use of this medicine in children. Special care may be needed. Overdosage: If you think you have taken too much of this medicine contact a poison control center or emergency room at once. NOTE: This medicine is only for you. Do not share this medicine with others. What if I miss a dose? It is important not to miss your dose. Call your doctor or health care provider if you are unable to keep an appointment. What may interact with this medication? Do not take this medicine with any of the following medications: live virus vaccines This medicine may also interact with the following medications: certain antibiotics such as erythromycin or clarithromycin certain antivirals for HIV or hepatitis certain medicines for fungal infections such as fluconazole, ketoconazole, itraconazole, posaconazole, or voriconazole cimetidine ciprofloxacin conivaptan crizotinib cyclosporine diltiazem dronedarone fluvoxamine grapefruit juice idelalisib imatinib nefazodone nelfinavir verapamil This list may not describe all possible interactions. Give your health care provider a list of all the medicines, herbs, non-prescription drugs, or dietary supplements you use. Also tell them if you smoke, drink alcohol, or use illegal drugs. Some items may interact with your medicine. What should I watch for while using this medication? Your condition will be monitored carefully while you are receiving this medicine. This medicine may make you feel generally  unwell. This is not uncommon as chemotherapy can affect healthy cells as well as cancer cells. Report any side effects. Continue your course of treatment even though you feel ill unless your health care provider tells you to stop. Do not become pregnant while taking this medicine or for 6 months after stopping it. Women should inform their health care provider if they wish to become pregnant or think they might be pregnant. Men should not father a child while taking this medicine and for 3 months after stopping it. There is potential for serious side effects to an unborn child. Talk to your health care provider for more information. Do not breast-feed while taking this medicine or for 9 days after stopping it. This may make it more difficult to father a child. Talk to your health care provider if you are concerned about your fertility. This medicine will cause constipation. Try to have a bowel movement at least every 2 to 3 days. If you do not have a bowel movement for 3 days, call your health care provider. This medicine may increase your risk of getting an infection. Call your health care provider for advice if you get a fever, chills, or sore throat, or other symptoms of a cold or flu. Do not treat yourself. Try to avoid being around people who are sick. Call your health care provider if you are around anyone with measles, chickenpox, or if you develop sores or blisters that do not  heal properly. You may need blood work done while you are taking this medicine. Avoid taking medicines that contain aspirin, acetaminophen, ibuprofen, naproxen, or ketoprofen unless instructed by your health care provider. These medicines may hide a fever. Be careful brushing or flossing your teeth or using a toothpick because you may get an infection or bleed more easily. If you have any dental work done, tell your dentist you are receiving this medicine. What side effects may I notice from receiving this medication? Side  effects that you should report to your doctor or health care professional as soon as possible: allergic reactions like skin rash, itching or hives; swelling of the face, lips, or tongue breathing problems constipation pain, redness, or irritation at site where injected signs and symptoms of bleeding such as bloody or black, tarry stools; red or dark brown urine; spitting up blood or brown material that looks like coffee grounds; red spots on the skin; unusual bruising or bleeding from the eyes, gums, or nose signs and symptoms of infection like fever; chills; cough; sore throat; pain or trouble passing urine signs and symptoms of liver injury like dark yellow or brown urine; general ill feeling or flu-like symptoms; light-colored stools; loss of appetite; nausea; right upper belly pain; unusually weak or tired; yellowing of the eyes or skin signs and symptoms of low red blood cells or anemia such as unusually weak or tired; feeling faint or lightheaded; falls; breathing problems tingling, numbness in the hands or feet Side effects that usually do not require medical attention (report to your doctor or health care professional if they continue or are bothersome): jaw pain loss of appetite nausea, vomiting stomach pain This list may not describe all possible side effects. Call your doctor for medical advice about side effects. You may report side effects to FDA at 1-800-FDA-1088. Where should I keep my medication? This drug is given in a hospital or clinic and will not be stored at home. NOTE: This sheet is a summary. It may not cover all possible information. If you have questions about this medicine, talk to your doctor, pharmacist, or health care provider.  2022 Elsevier/Gold Standard (2019-05-24 17:40:18)  Gemcitabine injection What is this medication? GEMCITABINE (jem SYE ta been) is a chemotherapy drug. This medicine is used to treat many types of cancer like breast cancer, lung cancer,  pancreatic cancer, and ovarian cancer. This medicine may be used for other purposes; ask your health care provider or pharmacist if you have questions. COMMON BRAND NAME(S): Gemzar, Infugem What should I tell my care team before I take this medication? They need to know if you have any of these conditions: blood disorders infection kidney disease liver disease lung or breathing disease, like asthma recent or ongoing radiation therapy an unusual or allergic reaction to gemcitabine, other chemotherapy, other medicines, foods, dyes, or preservatives pregnant or trying to get pregnant breast-feeding How should I use this medication? This drug is given as an infusion into a vein. It is administered in a hospital or clinic by a specially trained health care professional. Talk to your pediatrician regarding the use of this medicine in children. Special care may be needed. Overdosage: If you think you have taken too much of this medicine contact a poison control center or emergency room at once. NOTE: This medicine is only for you. Do not share this medicine with others. What if I miss a dose? It is important not to miss your dose. Call your doctor or health care professional  if you are unable to keep an appointment. What may interact with this medication? medicines to increase blood counts like filgrastim, pegfilgrastim, sargramostim some other chemotherapy drugs like cisplatin vaccines Talk to your doctor or health care professional before taking any of these medicines: acetaminophen aspirin ibuprofen ketoprofen naproxen This list may not describe all possible interactions. Give your health care provider a list of all the medicines, herbs, non-prescription drugs, or dietary supplements you use. Also tell them if you smoke, drink alcohol, or use illegal drugs. Some items may interact with your medicine. What should I watch for while using this medication? Visit your doctor for checks on  your progress. This drug may make you feel generally unwell. This is not uncommon, as chemotherapy can affect healthy cells as well as cancer cells. Report any side effects. Continue your course of treatment even though you feel ill unless your doctor tells you to stop. In some cases, you may be given additional medicines to help with side effects. Follow all directions for their use. Call your doctor or health care professional for advice if you get a fever, chills or sore throat, or other symptoms of a cold or flu. Do not treat yourself. This drug decreases your body's ability to fight infections. Try to avoid being around people who are sick. This medicine may increase your risk to bruise or bleed. Call your doctor or health care professional if you notice any unusual bleeding. Be careful brushing and flossing your teeth or using a toothpick because you may get an infection or bleed more easily. If you have any dental work done, tell your dentist you are receiving this medicine. Avoid taking products that contain aspirin, acetaminophen, ibuprofen, naproxen, or ketoprofen unless instructed by your doctor. These medicines may hide a fever. Do not become pregnant while taking this medicine or for 6 months after stopping it. Women should inform their doctor if they wish to become pregnant or think they might be pregnant. Men should not father a child while taking this medicine and for 3 months after stopping it. There is a potential for serious side effects to an unborn child. Talk to your health care professional or pharmacist for more information. Do not breast-feed an infant while taking this medicine or for at least 1 week after stopping it. Men should inform their doctors if they wish to father a child. This medicine may lower sperm counts. Talk with your doctor or health care professional if you are concerned about your fertility. What side effects may I notice from receiving this medication? Side  effects that you should report to your doctor or health care professional as soon as possible: allergic reactions like skin rash, itching or hives, swelling of the face, lips, or tongue breathing problems pain, redness, or irritation at site where injected signs and symptoms of a dangerous change in heartbeat or heart rhythm like chest pain; dizziness; fast or irregular heartbeat; palpitations; feeling faint or lightheaded, falls; breathing problems signs of decreased platelets or bleeding - bruising, pinpoint red spots on the skin, black, tarry stools, blood in the urine signs of decreased red blood cells - unusually weak or tired, feeling faint or lightheaded, falls signs of infection - fever or chills, cough, sore throat, pain or difficulty passing urine signs and symptoms of kidney injury like trouble passing urine or change in the amount of urine signs and symptoms of liver injury like dark yellow or brown urine; general ill feeling or flu-like symptoms; light-colored stools; loss  of appetite; nausea; right upper belly pain; unusually weak or tired; yellowing of the eyes or skin swelling of ankles, feet, hands Side effects that usually do not require medical attention (report to your doctor or health care professional if they continue or are bothersome): constipation diarrhea hair loss loss of appetite nausea rash vomiting This list may not describe all possible side effects. Call your doctor for medical advice about side effects. You may report side effects to FDA at 1-800-FDA-1088. Where should I keep my medication? This drug is given in a hospital or clinic and will not be stored at home. NOTE: This sheet is a summary. It may not cover all possible information. If you have questions about this medicine, talk to your doctor, pharmacist, or health care provider.  2022 Elsevier/Gold Standard (2017-08-18 18:06:11)

## 2021-02-28 ENCOUNTER — Telehealth: Payer: Self-pay | Admitting: Oncology

## 2021-02-28 NOTE — Telephone Encounter (Signed)
Scheduled per 09/21 los, patient has been called and notified of upcoming appointments.

## 2021-03-05 ENCOUNTER — Inpatient Hospital Stay: Payer: Medicare HMO

## 2021-03-05 ENCOUNTER — Other Ambulatory Visit: Payer: Self-pay

## 2021-03-05 VITALS — BP 138/76 | HR 97 | Temp 98.1°F | Resp 20 | Wt 180.4 lb

## 2021-03-05 DIAGNOSIS — Z5111 Encounter for antineoplastic chemotherapy: Secondary | ICD-10-CM | POA: Diagnosis not present

## 2021-03-05 DIAGNOSIS — C3492 Malignant neoplasm of unspecified part of left bronchus or lung: Secondary | ICD-10-CM

## 2021-03-05 DIAGNOSIS — C3431 Malignant neoplasm of lower lobe, right bronchus or lung: Secondary | ICD-10-CM | POA: Diagnosis not present

## 2021-03-05 DIAGNOSIS — Z79899 Other long term (current) drug therapy: Secondary | ICD-10-CM | POA: Diagnosis not present

## 2021-03-05 LAB — CBC WITH DIFFERENTIAL (CANCER CENTER ONLY)
Abs Immature Granulocytes: 0.01 10*3/uL (ref 0.00–0.07)
Basophils Absolute: 0 10*3/uL (ref 0.0–0.1)
Basophils Relative: 0 %
Eosinophils Absolute: 0.1 10*3/uL (ref 0.0–0.5)
Eosinophils Relative: 4 %
HCT: 34.3 % — ABNORMAL LOW (ref 39.0–52.0)
Hemoglobin: 12.2 g/dL — ABNORMAL LOW (ref 13.0–17.0)
Immature Granulocytes: 0 %
Lymphocytes Relative: 17 %
Lymphs Abs: 0.4 10*3/uL — ABNORMAL LOW (ref 0.7–4.0)
MCH: 29 pg (ref 26.0–34.0)
MCHC: 35.6 g/dL (ref 30.0–36.0)
MCV: 81.7 fL (ref 80.0–100.0)
Monocytes Absolute: 0.2 10*3/uL (ref 0.1–1.0)
Monocytes Relative: 6 %
Neutro Abs: 1.8 10*3/uL (ref 1.7–7.7)
Neutrophils Relative %: 73 %
Platelet Count: 93 10*3/uL — ABNORMAL LOW (ref 150–400)
RBC: 4.2 MIL/uL — ABNORMAL LOW (ref 4.22–5.81)
RDW: 14.6 % (ref 11.5–15.5)
WBC Count: 2.5 10*3/uL — ABNORMAL LOW (ref 4.0–10.5)
nRBC: 0 % (ref 0.0–0.2)

## 2021-03-05 LAB — CMP (CANCER CENTER ONLY)
ALT: 45 U/L — ABNORMAL HIGH (ref 0–44)
AST: 34 U/L (ref 15–41)
Albumin: 3.1 g/dL — ABNORMAL LOW (ref 3.5–5.0)
Alkaline Phosphatase: 117 U/L (ref 38–126)
Anion gap: 13 (ref 5–15)
BUN: 10 mg/dL (ref 8–23)
CO2: 26 mmol/L (ref 22–32)
Calcium: 9.9 mg/dL (ref 8.9–10.3)
Chloride: 97 mmol/L — ABNORMAL LOW (ref 98–111)
Creatinine: 0.79 mg/dL (ref 0.61–1.24)
GFR, Estimated: >60 mL/min (ref >60)
Glucose, Bld: 108 mg/dL — ABNORMAL HIGH (ref 70–99)
Potassium: 3.5 mmol/L (ref 3.5–5.1)
Sodium: 136 mmol/L (ref 135–145)
Total Bilirubin: 1.5 mg/dL — ABNORMAL HIGH (ref 0.3–1.2)
Total Protein: 7 g/dL (ref 6.5–8.1)

## 2021-03-05 MED ORDER — VINORELBINE TARTRATE CHEMO INJECTION 50 MG/5ML
24.0000 mg/m2 | Freq: Once | INTRAVENOUS | Status: AC
  Administered 2021-03-05: 50 mg via INTRAVENOUS
  Filled 2021-03-05: qty 5

## 2021-03-05 MED ORDER — PROCHLORPERAZINE MALEATE 10 MG PO TABS: 10.0000 mg | ORAL_TABLET | Freq: Once | ORAL | Status: AC

## 2021-03-05 MED ORDER — PROCHLORPERAZINE MALEATE 10 MG PO TABS
ORAL_TABLET | ORAL | Status: AC
  Administered 2021-03-05: 10 mg via ORAL
  Filled 2021-03-05: qty 1

## 2021-03-05 MED ORDER — SODIUM CHLORIDE 0.9 % IV SOLN: Freq: Once | INTRAVENOUS | Status: AC

## 2021-03-05 MED ORDER — SODIUM CHLORIDE 0.9 % IV SOLN
1000.0000 mg/m2 | Freq: Once | INTRAVENOUS | Status: AC
  Administered 2021-03-05: 2052 mg via INTRAVENOUS
  Filled 2021-03-05: qty 53.97

## 2021-03-05 NOTE — Progress Notes (Signed)
Per Dr. Alen Blew, "OK To Treat w/thrombocytopenia".

## 2021-03-05 NOTE — Patient Instructions (Signed)
[facility]  Discharge Instructions: Thank you for choosing [facility] to provide your oncology and hematology care.   If you have a lab appointment with the [facility], please go directly to the [facility] and check in at the registration area.   Wear comfortable clothing and clothing appropriate for easy access to any Portacath or PICC line.   We strive to give you quality time with your provider. You may need to reschedule your appointment if you arrive late (15 or more minutes).  Arriving late affects you and other patients whose appointments are after yours.  Also, if you miss three or more appointments without notifying the office, you may be dismissed from the clinic at the provider's discretion.      For prescription refill requests, have your pharmacy contact our office and allow 72 hours for refills to be completed.    Today you received the following chemotherapy and/or immunotherapy agents Vinorelbine (Navelbine) and Gemcitabine (Gemzar).      To help prevent nausea and vomiting after your treatment, we encourage you to take your nausea medication as directed.  BELOW ARE SYMPTOMS THAT SHOULD BE REPORTED IMMEDIATELY: *FEVER GREATER THAN 100.4 F (38 C) OR HIGHER *CHILLS OR SWEATING *NAUSEA AND VOMITING THAT IS NOT CONTROLLED WITH YOUR NAUSEA MEDICATION *UNUSUAL SHORTNESS OF BREATH *UNUSUAL BRUISING OR BLEEDING *URINARY PROBLEMS (pain or burning when urinating, or frequent urination) *BOWEL PROBLEMS (unusual diarrhea, constipation, pain near the anus) TENDERNESS IN MOUTH AND THROAT WITH OR WITHOUT PRESENCE OF ULCERS (sore throat, sores in mouth, or a toothache) UNUSUAL RASH, SWELLING OR PAIN  UNUSUAL VAGINAL DISCHARGE OR ITCHING   Items with * indicate a potential emergency and should be followed up as soon as possible or go to the Emergency Department if any problems should occur.  Please show the CHEMOTHERAPY ALERT CARD or  IMMUNOTHERAPY ALERT CARD at check-in to the Emergency Department and triage nurse.  Should you have questions after your visit or need to cancel or reschedule your appointment, please contact [facility]  Dept: [phone]  and follow the prompts.  Office hours are 8:00 a.m. to 4:30 p.m. Monday - Friday. Please note that voicemails left after 4:00 p.m. may not be returned until the following business day.  We are closed weekends and major holidays. You have access to a nurse at all times for urgent questions. Please call the main number to the clinic Dept: [phone] and follow the prompts.   For any non-urgent questions, you may also contact your provider using MyChart. We now offer e-Visits for anyone 56 and older to request care online for non-urgent symptoms. For details visit [email or web address].   Also download the MyChart app! Go to the app store, search "MyChart", open the app, select [facility], and log in with your MyChart username and password.  Due to Covid, a mask is required upon entering the hospital/clinic. If you do not have a mask, one will be given to you upon arrival. For doctor visits, patients may have 1 support person aged 22 or older with them. For treatment visits, patients cannot have anyone with them due to current Covid guidelines and our immunocompromised population.

## 2021-03-12 ENCOUNTER — Telehealth: Payer: Self-pay | Admitting: *Deleted

## 2021-03-12 ENCOUNTER — Other Ambulatory Visit: Payer: Self-pay | Admitting: Oncology

## 2021-03-12 MED ORDER — MEGESTROL ACETATE 400 MG/10ML PO SUSP: 400.0000 mg | Freq: Every day | ORAL | 1 refills | Status: AC

## 2021-03-12 NOTE — Telephone Encounter (Signed)
Clifford [name] states he has not appetite and has recently lost 5-6 lbs. Is asking about Megace as a possibility to stimulate appetite.  Also wants to know what can be done about his continued hoarseness. Has been going on since prior to chemo.

## 2021-03-12 NOTE — Telephone Encounter (Signed)
Notified that Megace was sent to pharmacy and Dr Alen Blew will address hoarseness at next visit.

## 2021-03-18 ENCOUNTER — Encounter: Payer: Self-pay | Admitting: Emergency Medicine

## 2021-03-18 ENCOUNTER — Other Ambulatory Visit: Payer: Self-pay

## 2021-03-18 ENCOUNTER — Ambulatory Visit (INDEPENDENT_AMBULATORY_CARE_PROVIDER_SITE_OTHER): Payer: Medicare HMO | Admitting: Emergency Medicine

## 2021-03-18 DIAGNOSIS — R053 Chronic cough: Secondary | ICD-10-CM

## 2021-03-18 DIAGNOSIS — J449 Chronic obstructive pulmonary disease, unspecified: Secondary | ICD-10-CM | POA: Diagnosis not present
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
## 2021-03-18 NOTE — Assessment & Plan Note (Addendum)
His cough is improved.  It does sound like he benefited from the prednisone and azithromycin, consistent with an acute exacerbation of COPD +/- bronchitis.  Has had some upper airway irritation type cough since then, is using Zyrtec and fluticasone nasal spray.  Intermittently needs his OTC cough syrup or even Hycodan.  I suspect that he is also benefiting from restarting salvage chemotherapy.  Following with Dr. Alen Blew. He remains on gabapentin - unsure whether it is helping in any way. Will probably try to withdraw it at some point in the future and see if he misses it.   Get your repeat CT scan at the end of the year as planned by Dr. Alen Blew Continue Zyrtec once daily Continue fluticasone nasal spray, 2 sprays each nostril once daily at least through the fall allergy season. Continue to use your Hycodan cough syrup and also your over-the-counter cough syrup when you need them for cough suppression. Follow with Dr Lamonte Sakai in December so we can review your symptoms and also your repeat CT scan.

## 2021-03-18 NOTE — Assessment & Plan Note (Signed)
Based on his response to prednisone/azithromycin, the benefit he gets from intermittent albuterol use, I suspect that he does have some degree of COPD.  We have to quantify this.  We will consider performing pulmonary function testing at some point going forward.  I talked him today about possibly starting maintenance bronchodilator therapy.  If his albuterol use increases then plan to revisit

## 2021-03-18 NOTE — Patient Instructions (Addendum)
Get your repeat CT scan at the end of the year as planned by Dr. Alen Blew Continue Zyrtec once daily Continue fluticasone nasal spray, 2 sprays each nostril once daily at least through the fall allergy season. Keep your albuterol available to use 2 puffs if you needed for shortness of breath, chest tightness, wheezing, spells of coughing. Depending on how your breathing, cough progress we may decide to consider an every day scheduled inhaler medication at some point in the future. Continue to use your Hycodan cough syrup and also your over-the-counter cough syrup when you need them for cough suppression. Follow with Dr Lamonte Sakai in December so we can review your symptoms and also your repeat CT scan.

## 2021-03-19 ENCOUNTER — Inpatient Hospital Stay: Payer: Medicare HMO | Attending: Oncology | Admitting: Oncology

## 2021-03-19 ENCOUNTER — Inpatient Hospital Stay: Payer: Medicare HMO

## 2021-03-19 VITALS — BP 133/72 | HR 69 | Temp 98.6°F | Resp 17 | Ht 71.0 in | Wt 175.2 lb

## 2021-03-19 DIAGNOSIS — C3431 Malignant neoplasm of lower lobe, right bronchus or lung: Secondary | ICD-10-CM

## 2021-03-19 DIAGNOSIS — C3492 Malignant neoplasm of unspecified part of left bronchus or lung: Secondary | ICD-10-CM

## 2021-03-19 DIAGNOSIS — Z5111 Encounter for antineoplastic chemotherapy: Secondary | ICD-10-CM | POA: Diagnosis not present

## 2021-03-19 DIAGNOSIS — D6959 Other secondary thrombocytopenia: Secondary | ICD-10-CM | POA: Insufficient documentation

## 2021-03-19 LAB — CBC WITH DIFFERENTIAL (CANCER CENTER ONLY)
Abs Immature Granulocytes: 0.01 10*3/uL (ref 0.00–0.07)
Basophils Absolute: 0.1 10*3/uL (ref 0.0–0.1)
Basophils Relative: 2 %
Eosinophils Absolute: 0 10*3/uL (ref 0.0–0.5)
Eosinophils Relative: 1 %
HCT: 34.5 % — ABNORMAL LOW (ref 39.0–52.0)
Hemoglobin: 11.8 g/dL — ABNORMAL LOW (ref 13.0–17.0)
Immature Granulocytes: 0 %
Lymphocytes Relative: 33 %
Lymphs Abs: 0.8 10*3/uL (ref 0.7–4.0)
MCH: 28.7 pg (ref 26.0–34.0)
MCHC: 34.2 g/dL (ref 30.0–36.0)
MCV: 83.9 fL (ref 80.0–100.0)
Monocytes Absolute: 0.6 10*3/uL (ref 0.1–1.0)
Monocytes Relative: 24 %
Neutro Abs: 1 10*3/uL — ABNORMAL LOW (ref 1.7–7.7)
Neutrophils Relative %: 40 %
Platelet Count: 497 10*3/uL — ABNORMAL HIGH (ref 150–400)
RBC: 4.11 MIL/uL — ABNORMAL LOW (ref 4.22–5.81)
RDW: 17.2 % — ABNORMAL HIGH (ref 11.5–15.5)
WBC Count: 2.3 10*3/uL — ABNORMAL LOW (ref 4.0–10.5)
nRBC: 0 % (ref 0.0–0.2)

## 2021-03-19 LAB — CMP (CANCER CENTER ONLY)
ALT: 29 U/L (ref 0–44)
AST: 23 U/L (ref 15–41)
Albumin: 3.5 g/dL (ref 3.5–5.0)
Alkaline Phosphatase: 100 U/L (ref 38–126)
Anion gap: 9 (ref 5–15)
BUN: 13 mg/dL (ref 8–23)
CO2: 31 mmol/L (ref 22–32)
Calcium: 9.9 mg/dL (ref 8.9–10.3)
Chloride: 102 mmol/L (ref 98–111)
Creatinine: 0.78 mg/dL (ref 0.61–1.24)
GFR, Estimated: >60 mL/min (ref >60)
Glucose, Bld: 114 mg/dL — ABNORMAL HIGH (ref 70–99)
Potassium: 3.8 mmol/L (ref 3.5–5.1)
Sodium: 142 mmol/L (ref 135–145)
Total Bilirubin: 1 mg/dL (ref 0.3–1.2)
Total Protein: 7.3 g/dL (ref 6.5–8.1)

## 2021-03-19 MED ORDER — SODIUM CHLORIDE 0.9 % IV SOLN: Freq: Once | INTRAVENOUS | Status: AC

## 2021-03-19 MED ORDER — SODIUM CHLORIDE 0.9 % IV SOLN
800.0000 mg/m2 | Freq: Once | INTRAVENOUS | Status: AC
  Administered 2021-03-19: 1672 mg via INTRAVENOUS
  Filled 2021-03-19: qty 43.97

## 2021-03-19 MED ORDER — VINORELBINE TARTRATE CHEMO INJECTION 50 MG/5ML
19.5000 mg/m2 | Freq: Once | INTRAVENOUS | Status: AC
  Administered 2021-03-19: 40 mg via INTRAVENOUS
  Filled 2021-03-19: qty 4

## 2021-03-19 MED ORDER — PROCHLORPERAZINE MALEATE 10 MG PO TABS
10.0000 mg | ORAL_TABLET | Freq: Once | ORAL | Status: AC
  Administered 2021-03-19: 10 mg via ORAL
  Filled 2021-03-19: qty 1

## 2021-03-19 NOTE — Patient Instructions (Signed)
[facility]   Discharge Instructions: Thank you for choosing [facility] to provide your oncology and hematology care.   If you have a lab appointment with the [facility], please go directly to the [facility] and check in at the registration area.   Wear comfortable clothing and clothing appropriate for easy access to any Portacath or PICC line.   We strive to give you quality time with your provider. You may need to reschedule your appointment if you arrive late (15 or more minutes).  Arriving late affects you and other patients whose appointments are after yours.  Also, if you miss three or more appointments without notifying the office, you may be dismissed from the clinic at the provider's discretion.      For prescription refill requests, have your pharmacy contact our office and allow 72 hours for refills to be completed.    Today you received the following chemotherapy and/or immunotherapy agents: vinorelbine and gemcitabine.      To help prevent nausea and vomiting after your treatment, we encourage you to take your nausea medication as directed.  BELOW ARE SYMPTOMS THAT SHOULD BE REPORTED IMMEDIATELY: *FEVER GREATER THAN 100.4 F (38 C) OR HIGHER *CHILLS OR SWEATING *NAUSEA AND VOMITING THAT IS NOT CONTROLLED WITH YOUR NAUSEA MEDICATION *UNUSUAL SHORTNESS OF BREATH *UNUSUAL BRUISING OR BLEEDING *URINARY PROBLEMS (pain or burning when urinating, or frequent urination) *BOWEL PROBLEMS (unusual diarrhea, constipation, pain near the anus) TENDERNESS IN MOUTH AND THROAT WITH OR WITHOUT PRESENCE OF ULCERS (sore throat, sores in mouth, or a toothache) UNUSUAL RASH, SWELLING OR PAIN  UNUSUAL VAGINAL DISCHARGE OR ITCHING   Items with * indicate a potential emergency and should be followed up as soon as possible or go to the Emergency Department if any problems should occur.  Please show the CHEMOTHERAPY ALERT CARD or IMMUNOTHERAPY ALERT  CARD at check-in to the Emergency Department and triage nurse.  Should you have questions after your visit or need to cancel or reschedule your appointment, please contact [facility]  Dept: [phone]  and follow the prompts.  Office hours are 8:00 a.m. to 4:30 p.m. Monday - Friday. Please note that voicemails left after 4:00 p.m. may not be returned until the following business day.  We are closed weekends and major holidays. You have access to a nurse at all times for urgent questions. Please call the main number to the clinic Dept: [phone] and follow the prompts.   For any non-urgent questions, you may also contact your provider using MyChart. We now offer e-Visits for anyone 64 and older to request care online for non-urgent symptoms. For details visit [email or web address].   Also download the MyChart app! Go to the app store, search "MyChart", open the app, select [facility], and log in with your MyChart username and password.  Due to Covid, a mask is required upon entering the hospital/clinic. If you do not have a mask, one will be given to you upon arrival. For doctor visits, patients may have 1 support person aged 62 or older with them. For treatment visits, patients cannot have anyone with them due to current Covid guidelines and our immunocompromised population.

## 2021-03-19 NOTE — Progress Notes (Signed)
Hematology and Oncology Follow Up Visit  Clifford [name] [id number] [birth year] 75 y.o. 03/19/2021 11:55 AM   Principle Diagnosis: 75 year old man with stage IV adenosquamous of the lung diagnosed in 2022.  He has a primary lung mass with nodules, PD-L1 score of 0.    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  Prior Therapy: Status post pleurodesis and biopsy of the pleural-based nodule. Received 6 cycles of CVP with rituximab. Therapy concluded in December 2007 after achieving remission at the time. 3.     Definitive therapy with radiation and weekly chemotherapy utilizing carboplatin and paclitaxel started on August 20, 2020.  He completed 6 cycles of therapy on September 24, 2020. 4.   Nivolumab 480 mg monthly started in June 2022.  He completed 3 cycles of therapy in August 2022 with progression of disease.  Current therapy: Gemcitabine 1000 mg per metered square with [name] being 25 mg per metered square day 1, day 8. gemcitabine 1000 mg per metered squared day 1 and 8.  He presents for day 1 cycle 2 of therapy.  Interim History:  [name] is here for a follow-up visit.  Since the last visit, he completed the first cycle of chemotherapy without any major complaints.  He denies any nausea or vomiting but that has reported excessive fatigue and tiredness.  His respiratory status has improved slightly at this time.  He is reporting less cough and dyspnea on exertion.  He denies any worsening neuropathy or infusion related complications.  His performance status remains marginal but has not declined.    Medications: Reviewed without changes. Current Outpatient Medications  Medication Sig Dispense Refill   albuterol (VENTOLIN HFA) 108 (90 Base) MCG/ACT inhaler Inhale 2 puffs into the lungs every 6 (six) hours as needed for wheezing or shortness of breath. 8 g 6   amLODipine (NORVASC) 5 MG tablet Take 5 mg by mouth daily.       aspirin 81 MG tablet Take 81 mg by mouth daily.     atorvastatin (LIPITOR) 10 MG tablet Take 10 mg by mouth daily.     azithromycin (ZITHROMAX Z-PAK) 250 MG tablet Take 2 tabs today, then 1 tab until gone 6 each 0   benzonatate (TESSALON) 100 MG capsule Take 100-200 mg by mouth 3 (three) times daily as needed for cough. (Patient not taking: No sig reported)     cetirizine (ZYRTEC) 10 MG tablet Take 10 mg by mouth daily as needed for allergies.     chlorpheniramine-HYDROcodone (TUSSIONEX PENNKINETIC ER) 10-8 MG/5ML SUER Take 5 mLs by mouth every 12 (twelve) hours as needed for cough. 473 mL 0   fluticasone (FLONASE) 50 MCG/ACT nasal spray Place 2 sprays into both nostrils daily. 16 g 2   gabapentin (NEURONTIN) 300 MG capsule Take 1 capsule (300 mg total) by mouth 2 (two) times daily. 60 capsule 3   hydrochlorothiazide (HYDRODIURIL) 25 MG tablet Take 25 mg by mouth daily.     HYDROcodone bit-homatropine (HYCODAN) 5-1.5 MG/5ML syrup Take 5 mLs by mouth every 6 (six) hours as needed for cough. 473 mL 0   HYDROcodone-homatropine (HYCODAN) 5-1.5 MG/5ML syrup Take 5 mLs by mouth every 6 (six) hours as needed for cough. 473 mL 0   megestrol (MEGACE) 400 MG/10ML suspension Take 10 mLs (400 mg total) by mouth daily. 240 mL 1   potassium chloride SA (KLOR-CON) 20 MEQ tablet Take 1 tablet (20 mEq total) by mouth daily for 14 days.  14 tablet 0   predniSONE (DELTASONE) 10 MG tablet Take 4 tablets X 3 days, 3 tabs X 3 days, 2 tabs x 3 days, 1 tab x 3 days 30 tablet 0   prochlorperazine (COMPAZINE) 10 MG tablet Take 1 tablet (10 mg total) by mouth every 6 (six) hours as needed for nausea or vomiting. (Patient not taking: No sig reported) 30 tablet 0   sucralfate (CARAFATE) 1 g tablet Take 1 tablet (1 g total) by mouth 4 (four) times daily. Dissolve each tablet in 15 cc water before use. (Patient not taking: No sig reported) 120 tablet 1   traMADol (ULTRAM) 50 MG tablet Take 50 mg by mouth daily as needed for pain.      No current facility-administered medications for this visit.    Allergies: No Known Allergies     Physical Exam:    Blood pressure 133/72, pulse 69, temperature 98.6 F (37 C), temperature source Oral, resp. rate 17, height 5' 11" (1.803 m), weight 175 lb 3.2 oz (79.5 kg), SpO2 99 %.      ECOG: 1    General appearance: Comfortable appearing without any discomfort Head: Normocephalic without any trauma Oropharynx: Mucous membranes are moist and pink without any thrush or ulcers. Eyes: Pupils are equal and round reactive to light. Lymph nodes: No cervical, supraclavicular, inguinal or axillary lymphadenopathy.   Heart:regular rate and rhythm.  S1 and S2 without leg edema. Lung: Clear without any rhonchi or wheezes.  No dullness to percussion. Abdomin: Soft, nontender, nondistended with good bowel sounds.  No hepatosplenomegaly. Musculoskeletal: No joint deformity or effusion.  Full range of motion noted. Neurological: No deficits noted on motor, sensory and deep tendon reflex exam. Skin: No petechial rash or dryness.  Appeared moist.       Lab Results: Lab Results  Component Value Date   WBC 2.3 (L) 03/19/2021   HGB 11.8 (L) 03/19/2021   HCT 34.5 (L) 03/19/2021   MCV 83.9 03/19/2021   PLT 497 (H) 03/19/2021     Chemistry      Component Value Date/Time   NA 136 03/05/2021 0918   NA 144 10/21/2016 0808   K 3.5 03/05/2021 0918   K 4.0 10/21/2016 0808   CL 97 (L) 03/05/2021 0918   CL 109 (H) 10/26/2012 0841   CO2 26 03/05/2021 0918   CO2 29 10/21/2016 0808   BUN 10 03/05/2021 0918   BUN 15.3 10/21/2016 0808   CREATININE 0.79 03/05/2021 0918   CREATININE 1.0 10/21/2016 0808      Component Value Date/Time   CALCIUM 9.9 03/05/2021 0918   CALCIUM 9.3 10/21/2016 0808   ALKPHOS 117 03/05/2021 0918   ALKPHOS 53 10/21/2016 0808   AST 34 03/05/2021 0918   AST 27 10/21/2016 0808   ALT 45 (H) 03/05/2021 0918   ALT 37 10/21/2016 0808   BILITOT 1.5 (H)  03/05/2021 0918   BILITOT 1.09 10/21/2016 0808           Impression and Plan:  75 year old with:   1.  Stage IV adenosquamous lung cancer with enlarging pulmonary mass and scattered nodules noted in 2022.     He is currently receiving salvage therapy utilizing gemcitabine and [name] being with a reasonable tolerance and improvement in his clinical status.  Risks and benefits of continuing this treatment were reviewed at this time and he is agreeable to proceed with this treatment.  Will adjust the dosing of his chemotherapy given his cytopenia and side effects.  2.  Weight loss: We discussed strategies to improve his nutritional intake.  Including nutritional supplements.  Dose modifications might help with the symptoms.  3.  IV access: Peripheral veins are currently in use at this time without any issues.  4.  Antiemetics: No nausea or vomiting reported at this time.  Compazine available to him.  5.  Cough and pulmonary considerations: Improved at this time with the current pulmonary regimen.   6.  Goals of care and prognosis: Therapy remains palliative though aggressive measures are warranted at this time.  7.  Cytopenia: Related to chemotherapy.  We will adjust the dosing of both [name] being and gemcitabine and consider growth factor support for subsequent cycles.  8.  Follow-up: He will return in 1 week to complete the current cycle and in 3 weeks for the beginning of cycle 3.   30  minutes were spent on this encounter.  Time was dedicated to reviewing laboratory data, disease status update, treatment choices complication related to therapy.   Zola Button, MD 10/12/202211:55 AM

## 2021-03-19 NOTE — Progress Notes (Signed)
Ok to treat with ANC 1.0 per Dr. Alen Blew.

## 2021-03-24 ENCOUNTER — Other Ambulatory Visit: Payer: Self-pay | Admitting: Oncology

## 2021-03-24 DIAGNOSIS — C3431 Malignant neoplasm of lower lobe, right bronchus or lung: Secondary | ICD-10-CM

## 2021-03-25 ENCOUNTER — Encounter: Payer: Self-pay | Admitting: Oncology

## 2021-03-25 MED ORDER — HYDROCODONE BIT-HOMATROP MBR 5-1.5 MG/5ML PO SOLN: 5.0000 mL | Freq: Four times a day (QID) | ORAL | 0 refills | Status: DC | PRN

## 2021-03-26 ENCOUNTER — Inpatient Hospital Stay: Payer: Medicare HMO

## 2021-03-26 ENCOUNTER — Other Ambulatory Visit: Payer: Self-pay

## 2021-03-26 VITALS — BP 122/69 | HR 104 | Temp 97.8°F | Resp 18 | Ht 71.0 in | Wt 171.5 lb

## 2021-03-26 DIAGNOSIS — Z5111 Encounter for antineoplastic chemotherapy: Secondary | ICD-10-CM | POA: Diagnosis not present

## 2021-03-26 DIAGNOSIS — D6959 Other secondary thrombocytopenia: Secondary | ICD-10-CM | POA: Diagnosis not present

## 2021-03-26 DIAGNOSIS — C3431 Malignant neoplasm of lower lobe, right bronchus or lung: Secondary | ICD-10-CM

## 2021-03-26 DIAGNOSIS — C3492 Malignant neoplasm of unspecified part of left bronchus or lung: Secondary | ICD-10-CM

## 2021-03-26 LAB — CMP (CANCER CENTER ONLY)
ALT: 44 U/L (ref 0–44)
AST: 30 U/L (ref 15–41)
Albumin: 3.1 g/dL — ABNORMAL LOW (ref 3.5–5.0)
Alkaline Phosphatase: 118 U/L (ref 38–126)
Anion gap: 12 (ref 5–15)
BUN: 9 mg/dL (ref 8–23)
CO2: 25 mmol/L (ref 22–32)
Calcium: 9.6 mg/dL (ref 8.9–10.3)
Chloride: 100 mmol/L (ref 98–111)
Creatinine: 0.78 mg/dL (ref 0.61–1.24)
GFR, Estimated: >60 mL/min (ref >60)
Glucose, Bld: 133 mg/dL — ABNORMAL HIGH (ref 70–99)
Potassium: 2.9 mmol/L — ABNORMAL LOW (ref 3.5–5.1)
Sodium: 137 mmol/L (ref 135–145)
Total Bilirubin: 1.1 mg/dL (ref 0.3–1.2)
Total Protein: 6.9 g/dL (ref 6.5–8.1)

## 2021-03-26 LAB — CBC WITH DIFFERENTIAL (CANCER CENTER ONLY)
Abs Immature Granulocytes: 0.02 10*3/uL (ref 0.00–0.07)
Basophils Absolute: 0 10*3/uL (ref 0.0–0.1)
Basophils Relative: 1 %
Eosinophils Absolute: 0 10*3/uL (ref 0.0–0.5)
Eosinophils Relative: 0 %
HCT: 33.8 % — ABNORMAL LOW (ref 39.0–52.0)
Hemoglobin: 11.6 g/dL — ABNORMAL LOW (ref 13.0–17.0)
Immature Granulocytes: 1 %
Lymphocytes Relative: 27 %
Lymphs Abs: 0.6 10*3/uL — ABNORMAL LOW (ref 0.7–4.0)
MCH: 28.1 pg (ref 26.0–34.0)
MCHC: 34.3 g/dL (ref 30.0–36.0)
MCV: 81.8 fL (ref 80.0–100.0)
Monocytes Absolute: 0.2 10*3/uL (ref 0.1–1.0)
Monocytes Relative: 8 %
Neutro Abs: 1.4 10*3/uL — ABNORMAL LOW (ref 1.7–7.7)
Neutrophils Relative %: 63 %
Platelet Count: 233 10*3/uL (ref 150–400)
RBC: 4.13 MIL/uL — ABNORMAL LOW (ref 4.22–5.81)
RDW: 16 % — ABNORMAL HIGH (ref 11.5–15.5)
WBC Count: 2.3 10*3/uL — ABNORMAL LOW (ref 4.0–10.5)
nRBC: 0 % (ref 0.0–0.2)

## 2021-03-26 MED ORDER — SODIUM CHLORIDE 0.9 % IV SOLN
800.0000 mg/m2 | Freq: Once | INTRAVENOUS | Status: AC
  Administered 2021-03-26: 1672 mg via INTRAVENOUS
  Filled 2021-03-26: qty 43.97

## 2021-03-26 MED ORDER — SODIUM CHLORIDE 0.9 % IV SOLN: Freq: Once | INTRAVENOUS | Status: AC

## 2021-03-26 MED ORDER — PROCHLORPERAZINE MALEATE 10 MG PO TABS
10.0000 mg | ORAL_TABLET | Freq: Once | ORAL | Status: AC
  Administered 2021-03-26: 10 mg via ORAL
  Filled 2021-03-26: qty 1

## 2021-03-26 MED ORDER — VINORELBINE TARTRATE CHEMO INJECTION 50 MG/5ML
19.5000 mg/m2 | Freq: Once | INTRAVENOUS | Status: AC
  Administered 2021-03-26: 40 mg via INTRAVENOUS
  Filled 2021-03-26: qty 4

## 2021-03-26 MED ORDER — SODIUM CHLORIDE 0.9 % IV SOLN: 20.0000 mg/m2 | Freq: Once | INTRAVENOUS | Status: DC

## 2021-03-26 NOTE — Patient Instructions (Signed)
[facility] ONCOLOGY   Discharge Instructions: Thank you for choosing [facility] to provide your oncology and hematology care.   If you have a lab appointment with the [facility], please go directly to the [facility] and check in at the registration area.   Wear comfortable clothing and clothing appropriate for easy access to any Portacath or PICC line.   We strive to give you quality time with your provider. You may need to reschedule your appointment if you arrive late (15 or more minutes).  Arriving late affects you and other patients whose appointments are after yours.  Also, if you miss three or more appointments without notifying the office, you may be dismissed from the clinic at the provider's discretion.      For prescription refill requests, have your pharmacy contact our office and allow 72 hours for refills to be completed.    Today you received the following chemotherapy and/or immunotherapy agents: vinorelbine and gemcitabine.      To help prevent nausea and vomiting after your treatment, we encourage you to take your nausea medication as directed.  BELOW ARE SYMPTOMS THAT SHOULD BE REPORTED IMMEDIATELY: *FEVER GREATER THAN 100.4 F (38 C) OR HIGHER *CHILLS OR SWEATING *NAUSEA AND VOMITING THAT IS NOT CONTROLLED WITH YOUR NAUSEA MEDICATION *UNUSUAL SHORTNESS OF BREATH *UNUSUAL BRUISING OR BLEEDING *URINARY PROBLEMS (pain or burning when urinating, or frequent urination) *BOWEL PROBLEMS (unusual diarrhea, constipation, pain near the anus) TENDERNESS IN MOUTH AND THROAT WITH OR WITHOUT PRESENCE OF ULCERS (sore throat, sores in mouth, or a toothache) UNUSUAL RASH, SWELLING OR PAIN  UNUSUAL VAGINAL DISCHARGE OR ITCHING   Items with * indicate a potential emergency and should be followed up as soon as possible or go to the Emergency Department if any problems should occur.  Please show the CHEMOTHERAPY ALERT CARD or IMMUNOTHERAPY ALERT  CARD at check-in to the Emergency Department and triage nurse.  Should you have questions after your visit or need to cancel or reschedule your appointment, please contact [facility]  Dept: [phone]  and follow the prompts.  Office hours are 8:00 a.m. to 4:30 p.m. Monday - Friday. Please note that voicemails left after 4:00 p.m. may not be returned until the following business day.  We are closed weekends and major holidays. You have access to a nurse at all times for urgent questions. Please call the main number to the clinic Dept: [phone] and follow the prompts.   For any non-urgent questions, you may also contact your provider using MyChart. We now offer e-Visits for anyone 48 and older to request care online for non-urgent symptoms. For details visit [email or web address].   Also download the MyChart app! Go to the app store, search "MyChart", open the app, select [facility], and log in with your MyChart username and password.  Due to Covid, a mask is required upon entering the hospital/clinic. If you do not have a mask, one will be given to you upon arrival. For doctor visits, patients may have 1 support person aged 30 or older with them. For treatment visits, patients cannot have anyone with them due to current Covid guidelines and our immunocompromised population.

## 2021-03-26 NOTE — Progress Notes (Signed)
Ok to treat today D8/C2 Naelbine and Gemzar per Dr. Alen Blew w/ ANC 1.4 and elevated HR.

## 2021-04-09 ENCOUNTER — Other Ambulatory Visit: Payer: Self-pay

## 2021-04-09 ENCOUNTER — Inpatient Hospital Stay: Payer: Medicare HMO | Attending: Oncology

## 2021-04-09 ENCOUNTER — Inpatient Hospital Stay: Payer: Medicare HMO

## 2021-04-09 ENCOUNTER — Inpatient Hospital Stay (HOSPITAL_BASED_OUTPATIENT_CLINIC_OR_DEPARTMENT_OTHER): Payer: Medicare HMO | Admitting: Oncology

## 2021-04-09 VITALS — BP 160/65 | HR 108 | Temp 99.0°F | Resp 18 | Wt 175.7 lb

## 2021-04-09 VITALS — BP 119/64 | HR 102 | Resp 18

## 2021-04-09 DIAGNOSIS — R634 Abnormal weight loss: Secondary | ICD-10-CM | POA: Insufficient documentation

## 2021-04-09 DIAGNOSIS — C3490 Malignant neoplasm of unspecified part of unspecified bronchus or lung: Secondary | ICD-10-CM | POA: Diagnosis not present

## 2021-04-09 DIAGNOSIS — C3492 Malignant neoplasm of unspecified part of left bronchus or lung: Secondary | ICD-10-CM

## 2021-04-09 DIAGNOSIS — D701 Agranulocytosis secondary to cancer chemotherapy: Secondary | ICD-10-CM | POA: Diagnosis not present

## 2021-04-09 DIAGNOSIS — C3431 Malignant neoplasm of lower lobe, right bronchus or lung: Secondary | ICD-10-CM | POA: Diagnosis not present

## 2021-04-09 DIAGNOSIS — Z79899 Other long term (current) drug therapy: Secondary | ICD-10-CM | POA: Diagnosis not present

## 2021-04-09 DIAGNOSIS — Z5112 Encounter for antineoplastic immunotherapy: Secondary | ICD-10-CM | POA: Insufficient documentation

## 2021-04-09 DIAGNOSIS — Z5111 Encounter for antineoplastic chemotherapy: Secondary | ICD-10-CM | POA: Diagnosis present

## 2021-04-09 LAB — CMP (CANCER CENTER ONLY)
ALT: 17 U/L (ref 0–44)
AST: 19 U/L (ref 15–41)
Albumin: 2.9 g/dL — ABNORMAL LOW (ref 3.5–5.0)
Alkaline Phosphatase: 115 U/L (ref 38–126)
Anion gap: 13 (ref 5–15)
BUN: 12 mg/dL (ref 8–23)
CO2: 27 mmol/L (ref 22–32)
Calcium: 9 mg/dL (ref 8.9–10.3)
Chloride: 98 mmol/L (ref 98–111)
Creatinine: 0.71 mg/dL (ref 0.61–1.24)
GFR, Estimated: >60 mL/min (ref >60)
Glucose, Bld: 102 mg/dL — ABNORMAL HIGH (ref 70–99)
Potassium: 3 mmol/L — ABNORMAL LOW (ref 3.5–5.1)
Sodium: 138 mmol/L (ref 135–145)
Total Bilirubin: 1.1 mg/dL (ref 0.3–1.2)
Total Protein: 6.4 g/dL — ABNORMAL LOW (ref 6.5–8.1)

## 2021-04-09 LAB — CBC WITH DIFFERENTIAL (CANCER CENTER ONLY)
Abs Immature Granulocytes: 0.01 10*3/uL (ref 0.00–0.07)
Basophils Absolute: 0 10*3/uL (ref 0.0–0.1)
Basophils Relative: 1 %
Eosinophils Absolute: 0.2 10*3/uL (ref 0.0–0.5)
Eosinophils Relative: 7 %
HCT: 31.7 % — ABNORMAL LOW (ref 39.0–52.0)
Hemoglobin: 10.6 g/dL — ABNORMAL LOW (ref 13.0–17.0)
Immature Granulocytes: 0 %
Lymphocytes Relative: 19 %
Lymphs Abs: 0.6 10*3/uL — ABNORMAL LOW (ref 0.7–4.0)
MCH: 28.8 pg (ref 26.0–34.0)
MCHC: 33.4 g/dL (ref 30.0–36.0)
MCV: 86.1 fL (ref 80.0–100.0)
Monocytes Absolute: 0.6 10*3/uL (ref 0.1–1.0)
Monocytes Relative: 18 %
Neutro Abs: 1.8 10*3/uL (ref 1.7–7.7)
Neutrophils Relative %: 55 %
Platelet Count: 396 10*3/uL (ref 150–400)
RBC: 3.68 MIL/uL — ABNORMAL LOW (ref 4.22–5.81)
RDW: 19.4 % — ABNORMAL HIGH (ref 11.5–15.5)
WBC Count: 3.3 10*3/uL — ABNORMAL LOW (ref 4.0–10.5)
nRBC: 0 % (ref 0.0–0.2)

## 2021-04-09 MED ORDER — SODIUM CHLORIDE 0.9 % IV SOLN: Freq: Once | INTRAVENOUS | Status: AC

## 2021-04-09 MED ORDER — PROCHLORPERAZINE MALEATE 10 MG PO TABS
10.0000 mg | ORAL_TABLET | Freq: Once | ORAL | Status: AC
  Administered 2021-04-09: 10 mg via ORAL
  Filled 2021-04-09: qty 1

## 2021-04-09 MED ORDER — VINORELBINE TARTRATE CHEMO INJECTION 10 MG/ML
19.5000 mg/m2 | Freq: Once | INTRAVENOUS | Status: AC
  Administered 2021-04-09: 40 mg via INTRAVENOUS
  Filled 2021-04-09: qty 4

## 2021-04-09 MED ORDER — SODIUM CHLORIDE 0.9 % IV SOLN
800.0000 mg/m2 | Freq: Once | INTRAVENOUS | Status: AC
  Administered 2021-04-09: 1672 mg via INTRAVENOUS
  Filled 2021-04-09: qty 43.97

## 2021-04-09 MED ORDER — SODIUM CHLORIDE 0.9 % IV SOLN
Freq: Once | INTRAVENOUS | Status: AC
Start: 2021-04-09 — End: 2021-04-09

## 2021-04-09 NOTE — Patient Instructions (Signed)
[facility] ONCOLOGY  Discharge Instructions: Thank you for choosing [facility] to provide your oncology and hematology care.   If you have a lab appointment with the [facility], please go directly to the [facility] and check in at the registration area.   Wear comfortable clothing and clothing appropriate for easy access to any Portacath or PICC line.   We strive to give you quality time with your provider. You may need to reschedule your appointment if you arrive late (15 or more minutes).  Arriving late affects you and other patients whose appointments are after yours.  Also, if you miss three or more appointments without notifying the office, you may be dismissed from the clinic at the provider's discretion.      For prescription refill requests, have your pharmacy contact our office and allow 72 hours for refills to be completed.    Today you received the following chemotherapy and/or immunotherapy agents: Navelbine/Gemzar.      To help prevent nausea and vomiting after your treatment, we encourage you to take your nausea medication as directed.  BELOW ARE SYMPTOMS THAT SHOULD BE REPORTED IMMEDIATELY: *FEVER GREATER THAN 100.4 F (38 C) OR HIGHER *CHILLS OR SWEATING *NAUSEA AND VOMITING THAT IS NOT CONTROLLED WITH YOUR NAUSEA MEDICATION *UNUSUAL SHORTNESS OF BREATH *UNUSUAL BRUISING OR BLEEDING *URINARY PROBLEMS (pain or burning when urinating, or frequent urination) *BOWEL PROBLEMS (unusual diarrhea, constipation, pain near the anus) TENDERNESS IN MOUTH AND THROAT WITH OR WITHOUT PRESENCE OF ULCERS (sore throat, sores in mouth, or a toothache) UNUSUAL RASH, SWELLING OR PAIN  UNUSUAL VAGINAL DISCHARGE OR ITCHING   Items with * indicate a potential emergency and should be followed up as soon as possible or go to the Emergency Department if any problems should occur.  Please show the CHEMOTHERAPY ALERT CARD or IMMUNOTHERAPY ALERT CARD at  check-in to the Emergency Department and triage nurse.  Should you have questions after your visit or need to cancel or reschedule your appointment, please contact [facility]  Dept: [phone]  and follow the prompts.  Office hours are 8:00 a.m. to 4:30 p.m. Monday - Friday. Please note that voicemails left after 4:00 p.m. may not be returned until the following business day.  We are closed weekends and major holidays. You have access to a nurse at all times for urgent questions. Please call the main number to the clinic Dept: [phone] and follow the prompts.   For any non-urgent questions, you may also contact your provider using MyChart. We now offer e-Visits for anyone 73 and older to request care online for non-urgent symptoms. For details visit [email or web address].   Also download the MyChart app! Go to the app store, search "MyChart", open the app, select [facility], and log in with your MyChart username and password.  Due to Covid, a mask is required upon entering the hospital/clinic. If you do not have a mask, one will be given to you upon arrival. For doctor visits, patients may have 1 support person aged 73 or older with them. For treatment visits, patients cannot have anyone with them due to current Covid guidelines and our immunocompromised population.

## 2021-04-09 NOTE — Progress Notes (Signed)
Per Dr. Alen Blew, ok to treat with tachycardia.

## 2021-04-09 NOTE — Progress Notes (Signed)
Hematology and Oncology Follow Up Visit  Clifford [name] [id number] April 26, 1946 75 y.o. 04/09/2021 7:57 AM   Principle Diagnosis: 75 year old man with lung cancer diagnosed in 2022.  He developed stage IV adenosquamous cancer with PD-L1 score of 0 and pulmonary nodules.   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  Prior Therapy: Status post pleurodesis and biopsy of the pleural-based nodule. Received 6 cycles of CVP with rituximab. Therapy concluded in December 2007 after achieving remission at the time. 3.     Definitive therapy with radiation and weekly chemotherapy utilizing carboplatin and paclitaxel started on August 20, 2020.  He completed 6 cycles of therapy on September 24, 2020. 4.   Nivolumab 480 mg monthly started in June 2022.  He completed 3 cycles of therapy in August 2022 with progression of disease.  Current therapy: Gemcitabine 1000 mg per metered square with Clifford [name] being 25 mg per metered square day 1, day 8. gemcitabine 1000 mg per metered squared day 1 and 8.  He presents for day 1 cycle 3 of therapy.  Interim History:  Clifford [name] returns today for a follow-up evaluation.  Since the last visit, he reports no major changes in his health.  He has reported some increase in his activity level and performance status.  He is respiratory status has also improved slightly.  He is eating better and has gained more weight.  He had denies any nausea, vomiting or worsening neuropathy.  He denies any excessive fatigue.  He does report some occasional dyspnea and his cough is improving.    Medications: Updated on review. Current Outpatient Medications  Medication Sig Dispense Refill   albuterol (VENTOLIN HFA) 108 (90 Base) MCG/ACT inhaler Inhale 2 puffs into the lungs every 6 (six) hours as needed for wheezing or shortness of breath. 8 g 6   amLODipine (NORVASC) 5 MG tablet Take 5 mg by mouth daily.      aspirin 81 MG  tablet Take 81 mg by mouth daily.     atorvastatin (LIPITOR) 10 MG tablet Take 10 mg by mouth daily.     azithromycin (ZITHROMAX Z-PAK) 250 MG tablet Take 2 tabs today, then 1 tab until gone 6 each 0   benzonatate (TESSALON) 100 MG capsule Take 100-200 mg by mouth 3 (three) times daily as needed for cough. (Patient not taking: No sig reported)     cetirizine (ZYRTEC) 10 MG tablet Take 10 mg by mouth daily as needed for allergies.     chlorpheniramine-HYDROcodone (TUSSIONEX PENNKINETIC ER) 10-8 MG/5ML SUER Take 5 mLs by mouth every 12 (twelve) hours as needed for cough. 473 mL 0   fluticasone (FLONASE) 50 MCG/ACT nasal spray Place 2 sprays into both nostrils daily. 16 g 2   gabapentin (NEURONTIN) 300 MG capsule Take 1 capsule (300 mg total) by mouth 2 (two) times daily. 60 capsule 3   hydrochlorothiazide (HYDRODIURIL) 25 MG tablet Take 25 mg by mouth daily.     HYDROcodone bit-homatropine (HYCODAN) 5-1.5 MG/5ML syrup Take 5 mLs by mouth every 6 (six) hours as needed for cough. 473 mL 0   HYDROcodone-homatropine (HYCODAN) 5-1.5 MG/5ML syrup Take 5 mLs by mouth every 6 (six) hours as needed for cough. 473 mL 0   megestrol (MEGACE) 400 MG/10ML suspension Take 10 mLs (400 mg total) by mouth daily. 240 mL 1   potassium chloride SA (KLOR-CON) 20 MEQ tablet Take 1 tablet (20 mEq total) by mouth daily for 14 days. [facility]  tablet 0   predniSONE (DELTASONE) 10 MG tablet Take 4 tablets X 3 days, 3 tabs X 3 days, 2 tabs x 3 days, 1 tab x 3 days 30 tablet 0   prochlorperazine (COMPAZINE) 10 MG tablet Take 1 tablet (10 mg total) by mouth every 6 (six) hours as needed for nausea or vomiting. (Patient not taking: No sig reported) 30 tablet 0   sucralfate (CARAFATE) 1 g tablet Take 1 tablet (1 g total) by mouth 4 (four) times daily. Dissolve each tablet in 15 cc water before use. (Patient not taking: No sig reported) 120 tablet 1   traMADol (ULTRAM) 50 MG tablet Take 50 mg by mouth daily as needed for pain.     No  current facility-administered medications for this visit.    Allergies: No Known Allergies     Physical Exam:      Blood pressure (!) 160/65, pulse (!) 108, temperature 99 F (37.2 C), temperature source Oral, resp. rate 18, weight 175 lb 11.2 oz (79.7 kg), SpO2 92 %.     ECOG: 1    General appearance: Alert, awake without any distress. Head: Atraumatic without abnormalities Oropharynx: Without any thrush or ulcers. Eyes: No scleral icterus. Lymph nodes: No lymphadenopathy noted in the cervical, supraclavicular, or axillary nodes Heart:regular rate and rhythm, without any murmurs or gallops.   Lung: Clear to auscultation without any rhonchi, wheezes or dullness to percussion. Abdomin: Soft, nontender without any shifting dullness or ascites. Musculoskeletal: No clubbing or cyanosis. Neurological: No motor or sensory deficits. Skin: No rashes or lesions.      Lab Results: Lab Results  Component Value Date   WBC 2.3 (L) 03/26/2021   HGB 11.6 (L) 03/26/2021   HCT 33.8 (L) 03/26/2021   MCV 81.8 03/26/2021   PLT 233 03/26/2021     Chemistry      Component Value Date/Time   NA 137 03/26/2021 0756   NA 144 10/21/2016 0808   K 2.9 (L) 03/26/2021 0756   K 4.0 10/21/2016 0808   CL 100 03/26/2021 0756   CL 109 (H) 10/26/2012 0841   CO2 25 03/26/2021 0756   CO2 29 10/21/2016 0808   BUN 9 03/26/2021 0756   BUN 15.3 10/21/2016 0808   CREATININE 0.78 03/26/2021 0756   CREATININE 1.0 10/21/2016 0808      Component Value Date/Time   CALCIUM 9.6 03/26/2021 0756   CALCIUM 9.3 10/21/2016 0808   ALKPHOS 118 03/26/2021 0756   ALKPHOS 53 10/21/2016 0808   AST 30 03/26/2021 0756   AST 27 10/21/2016 0808   ALT 44 03/26/2021 0756   ALT 37 10/21/2016 0808   BILITOT 1.1 03/26/2021 0756   BILITOT 1.09 10/21/2016 0808           Impression and Plan:  75 year old with:   1.  Lung cancer diagnosed in 2022.  He was found to have stage IV adenosquamous with  pulmonary nodules.     The natural course of this disease was reviewed at this time and treatment choices were discussed.  Risks and benefits of continuing this chemotherapy were reviewed including nausea, vomiting, myelosuppression and possible sepsis.  After discussion today he is agreeable to proceed and we will update his staging scans before the next visit.  2.  Weight loss: Improved at this time and he is eating better.  3.  IV access: No issues reported at this time with peripheral veins.  4.  Antiemetics: Compazine is available to him without any nausea  or vomiting.  5.  Cough and pulmonary considerations: Related to his lung malignancy has improved.  He is taking less cough medicine which could reflect improvement in his cancer status.   6.  Goals of care and prognosis: His disease is incurable although aggressive measures are warranted.  7.  Neutropenia: Related to chemotherapy and might require growth factor support after dose adjustment.  8.  Follow-up: In 1 week to complete cycle 3 and 3 weeks for the start of cycle 4.   30  minutes were dedicated to this visit.  The time was spent on reviewing laboratory data, disease status update and outlining future plan of care.   Zola Button, MD 11/2/20227:57 AM

## 2021-04-16 ENCOUNTER — Other Ambulatory Visit: Payer: Self-pay

## 2021-04-16 ENCOUNTER — Inpatient Hospital Stay: Payer: Medicare HMO

## 2021-04-16 ENCOUNTER — Telehealth: Payer: Self-pay | Admitting: *Deleted

## 2021-04-16 VITALS — BP 124/66 | HR 99 | Temp 98.0°F | Resp 18 | Ht 71.0 in | Wt 173.5 lb

## 2021-04-16 DIAGNOSIS — C3431 Malignant neoplasm of lower lobe, right bronchus or lung: Secondary | ICD-10-CM

## 2021-04-16 DIAGNOSIS — D701 Agranulocytosis secondary to cancer chemotherapy: Secondary | ICD-10-CM | POA: Diagnosis not present

## 2021-04-16 DIAGNOSIS — Z79899 Other long term (current) drug therapy: Secondary | ICD-10-CM | POA: Diagnosis not present

## 2021-04-16 DIAGNOSIS — C3492 Malignant neoplasm of unspecified part of left bronchus or lung: Secondary | ICD-10-CM

## 2021-04-16 DIAGNOSIS — E876 Hypokalemia: Secondary | ICD-10-CM

## 2021-04-16 DIAGNOSIS — Z5112 Encounter for antineoplastic immunotherapy: Secondary | ICD-10-CM | POA: Diagnosis not present

## 2021-04-16 DIAGNOSIS — R634 Abnormal weight loss: Secondary | ICD-10-CM | POA: Diagnosis not present

## 2021-04-16 DIAGNOSIS — Z5111 Encounter for antineoplastic chemotherapy: Secondary | ICD-10-CM | POA: Diagnosis not present

## 2021-04-16 LAB — CMP (CANCER CENTER ONLY)
ALT: 79 U/L — ABNORMAL HIGH (ref 0–44)
AST: 56 U/L — ABNORMAL HIGH (ref 15–41)
Albumin: 3 g/dL — ABNORMAL LOW (ref 3.5–5.0)
Alkaline Phosphatase: 105 U/L (ref 38–126)
Anion gap: 12 (ref 5–15)
BUN: 11 mg/dL (ref 8–23)
CO2: 28 mmol/L (ref 22–32)
Calcium: 8.9 mg/dL (ref 8.9–10.3)
Chloride: 98 mmol/L (ref 98–111)
Creatinine: 0.75 mg/dL (ref 0.61–1.24)
GFR, Estimated: >60 mL/min
Glucose, Bld: 97 mg/dL (ref 70–99)
Potassium: 2.6 mmol/L — CL (ref 3.5–5.1)
Sodium: 138 mmol/L (ref 135–145)
Total Bilirubin: 1 mg/dL (ref 0.3–1.2)
Total Protein: 6.5 g/dL (ref 6.5–8.1)

## 2021-04-16 LAB — CBC WITH DIFFERENTIAL (CANCER CENTER ONLY)
Abs Immature Granulocytes: 0.02 10*3/uL (ref 0.00–0.07)
Basophils Absolute: 0 10*3/uL (ref 0.0–0.1)
Basophils Relative: 0 %
Eosinophils Absolute: 0 10*3/uL (ref 0.0–0.5)
Eosinophils Relative: 1 %
HCT: 33 % — ABNORMAL LOW (ref 39.0–52.0)
Hemoglobin: 10.9 g/dL — ABNORMAL LOW (ref 13.0–17.0)
Immature Granulocytes: 1 %
Lymphocytes Relative: 25 %
Lymphs Abs: 0.7 10*3/uL (ref 0.7–4.0)
MCH: 28.6 pg (ref 26.0–34.0)
MCHC: 33 g/dL (ref 30.0–36.0)
MCV: 86.6 fL (ref 80.0–100.0)
Monocytes Absolute: 0.2 10*3/uL (ref 0.1–1.0)
Monocytes Relative: 6 %
Neutro Abs: 1.8 10*3/uL (ref 1.7–7.7)
Neutrophils Relative %: 67 %
Platelet Count: 170 10*3/uL (ref 150–400)
RBC: 3.81 MIL/uL — ABNORMAL LOW (ref 4.22–5.81)
RDW: 18.4 % — ABNORMAL HIGH (ref 11.5–15.5)
WBC Count: 2.7 10*3/uL — ABNORMAL LOW (ref 4.0–10.5)
nRBC: 0 % (ref 0.0–0.2)

## 2021-04-16 MED ORDER — SODIUM CHLORIDE 0.9 % IV SOLN
800.0000 mg/m2 | Freq: Once | INTRAVENOUS | Status: AC
  Administered 2021-04-16: 1672 mg via INTRAVENOUS
  Filled 2021-04-16: qty 43.97

## 2021-04-16 MED ORDER — POTASSIUM CHLORIDE CRYS ER 20 MEQ PO TBCR: 20.0000 meq | EXTENDED_RELEASE_TABLET | Freq: Every day | ORAL | 0 refills | Status: DC

## 2021-04-16 MED ORDER — VINORELBINE TARTRATE CHEMO INJECTION 50 MG/5ML
19.5000 mg/m2 | Freq: Once | INTRAVENOUS | Status: AC
  Administered 2021-04-16: 40 mg via INTRAVENOUS
  Filled 2021-04-16: qty 4

## 2021-04-16 MED ORDER — SODIUM CHLORIDE 0.9 % IV SOLN: Freq: Once | INTRAVENOUS | Status: AC

## 2021-04-16 MED ORDER — PROCHLORPERAZINE MALEATE 10 MG PO TABS
10.0000 mg | ORAL_TABLET | Freq: Once | ORAL | Status: AC
  Administered 2021-04-16: 10 mg via ORAL
  Filled 2021-04-16: qty 1

## 2021-04-16 NOTE — Patient Instructions (Signed)
[facility] ONCOLOGY  Discharge Instructions: Thank you for choosing [facility] to provide your oncology and hematology care.   If you have a lab appointment with the [facility], please go directly to the [facility] and check in at the registration area.   Wear comfortable clothing and clothing appropriate for easy access to any Portacath or PICC line.   We strive to give you quality time with your provider. You may need to reschedule your appointment if you arrive late (15 or more minutes).  Arriving late affects you and other patients whose appointments are after yours.  Also, if you miss three or more appointments without notifying the office, you may be dismissed from the clinic at the provider's discretion.      For prescription refill requests, have your pharmacy contact our office and allow 72 hours for refills to be completed.    Today you received the following chemotherapy and/or immunotherapy agents: Navelbine/Gemzar.      To help prevent nausea and vomiting after your treatment, we encourage you to take your nausea medication as directed.  BELOW ARE SYMPTOMS THAT SHOULD BE REPORTED IMMEDIATELY: *FEVER GREATER THAN 100.4 F (38 C) OR HIGHER *CHILLS OR SWEATING *NAUSEA AND VOMITING THAT IS NOT CONTROLLED WITH YOUR NAUSEA MEDICATION *UNUSUAL SHORTNESS OF BREATH *UNUSUAL BRUISING OR BLEEDING *URINARY PROBLEMS (pain or burning when urinating, or frequent urination) *BOWEL PROBLEMS (unusual diarrhea, constipation, pain near the anus) TENDERNESS IN MOUTH AND THROAT WITH OR WITHOUT PRESENCE OF ULCERS (sore throat, sores in mouth, or a toothache) UNUSUAL RASH, SWELLING OR PAIN  UNUSUAL VAGINAL DISCHARGE OR ITCHING   Items with * indicate a potential emergency and should be followed up as soon as possible or go to the Emergency Department if any problems should occur.  Please show the CHEMOTHERAPY ALERT CARD or IMMUNOTHERAPY ALERT CARD at  check-in to the Emergency Department and triage nurse.  Should you have questions after your visit or need to cancel or reschedule your appointment, please contact [facility]  Dept: [phone]  and follow the prompts.  Office hours are 8:00 a.m. to 4:30 p.m. Monday - Friday. Please note that voicemails left after 4:00 p.m. may not be returned until the following business day.  We are closed weekends and major holidays. You have access to a nurse at all times for urgent questions. Please call the main number to the clinic Dept: [phone] and follow the prompts.   For any non-urgent questions, you may also contact your provider using MyChart. We now offer e-Visits for anyone 82 and older to request care online for non-urgent symptoms. For details visit [email or web address].   Also download the MyChart app! Go to the app store, search "MyChart", open the app, select [facility], and log in with your MyChart username and password.  Due to Covid, a mask is required upon entering the hospital/clinic. If you do not have a mask, one will be given to you upon arrival. For doctor visits, patients may have 1 support person aged 93 or older with them. For treatment visits, patients cannot have anyone with them due to current Covid guidelines and our immunocompromised population.

## 2021-04-16 NOTE — Telephone Encounter (Signed)
CRITICAL VALUE STICKER  CRITICAL VALUE:Potassium  RECEIVER (on-site recipient of call):Sandi K  DATE & TIME NOTIFIED: 04/16/21; 3524  MESSENGER (representative from lab):Pam  MD NOTIFIED: Dr. Alen Blew  TIME OF NOTIFICATION: 8185  RESPONSE: Information acknowledged - Medication added

## 2021-04-20 ENCOUNTER — Other Ambulatory Visit: Payer: Self-pay | Admitting: Oncology

## 2021-04-20 DIAGNOSIS — C3431 Malignant neoplasm of lower lobe, right bronchus or lung: Secondary | ICD-10-CM

## 2021-04-20 DIAGNOSIS — E876 Hypokalemia: Secondary | ICD-10-CM

## 2021-04-21 ENCOUNTER — Telehealth: Payer: Self-pay | Admitting: *Deleted

## 2021-04-21 NOTE — Telephone Encounter (Signed)
Patient called asking for refill of his Hydrocodone cough syrup.  Routing to MD to please review.  Pharmacy on file.

## 2021-04-22 ENCOUNTER — Encounter: Payer: Self-pay | Admitting: Oncology

## 2021-04-22 MED ORDER — HYDROCODONE BIT-HOMATROP MBR 5-1.5 MG/5ML PO SOLN: 5.0000 mL | Freq: Four times a day (QID) | ORAL | 0 refills | Status: DC | PRN

## 2021-04-22 MED ORDER — POTASSIUM CHLORIDE CRYS ER 20 MEQ PO TBCR: 20.0000 meq | EXTENDED_RELEASE_TABLET | Freq: Every day | ORAL | 0 refills | Status: DC

## 2021-04-23 ENCOUNTER — Ambulatory Visit (HOSPITAL_COMMUNITY)
Admission: RE | Admit: 2021-04-23 | Discharge: 2021-04-23 | Disposition: A | Discharge status: Home / Self Care | Payer: Medicare HMO | Source: Ambulatory Visit | Attending: Oncology | Admitting: Oncology

## 2021-04-23 ENCOUNTER — Other Ambulatory Visit: Payer: Self-pay

## 2021-04-23 ENCOUNTER — Encounter (HOSPITAL_COMMUNITY): Payer: Self-pay

## 2021-04-23 ENCOUNTER — Other Ambulatory Visit: Payer: Self-pay | Admitting: Oncology

## 2021-04-23 DIAGNOSIS — C3490 Malignant neoplasm of unspecified part of unspecified bronchus or lung: Secondary | ICD-10-CM | POA: Diagnosis not present

## 2021-04-23 DIAGNOSIS — N400 Enlarged prostate without lower urinary tract symptoms: Secondary | ICD-10-CM | POA: Diagnosis not present

## 2021-04-23 DIAGNOSIS — J90 Pleural effusion, not elsewhere classified: Secondary | ICD-10-CM | POA: Diagnosis not present

## 2021-04-23 DIAGNOSIS — I2510 Atherosclerotic heart disease of native coronary artery without angina pectoris: Secondary | ICD-10-CM | POA: Diagnosis not present

## 2021-04-23 DIAGNOSIS — C7951 Secondary malignant neoplasm of bone: Secondary | ICD-10-CM | POA: Diagnosis not present

## 2021-04-23 DIAGNOSIS — K3189 Other diseases of stomach and duodenum: Secondary | ICD-10-CM | POA: Diagnosis not present

## 2021-04-23 DIAGNOSIS — K7689 Other specified diseases of liver: Secondary | ICD-10-CM | POA: Diagnosis not present

## 2021-04-23 MED ORDER — IOHEXOL 350 MG/ML SOLN
75.0000 mL | Freq: Once | INTRAVENOUS | Status: AC | PRN
  Administered 2021-04-23: 75 mL via INTRAVENOUS

## 2021-04-23 MED ORDER — SODIUM CHLORIDE (PF) 0.9 % IJ SOLN
INTRAMUSCULAR | Status: AC
  Filled 2021-04-23: qty 50

## 2021-04-23 NOTE — Progress Notes (Signed)
DISCONTINUE OFF PATHWAY REGIMEN - Non-Small Cell Lung   [id number]:Gemcitabine 800 mg/m2 + Vinorelbine 15 mg/m2 + Liposomal Doxorubicin 10 mg/m2 q21 days (GVD post-transplant, dose-reduced):   A cycle is every 21 days:     Gemcitabine      Vinorelbine      Liposomal doxorubicin   **Always confirm dose/schedule in your pharmacy ordering system**  REASON: Disease Progression PRIOR TREATMENT: Off Pathway: Gemcitabine 800 mg/m2 + Vinorelbine 15 mg/m2 + Liposomal Doxorubicin 10 mg/m2 q21 days (GVD post-transplant, dose-reduced) TREATMENT RESPONSE: Progressive Disease (PD)  START ON PATHWAY REGIMEN - Non-Small Cell Lung     A cycle is every 21 days:     Ramucirumab      Docetaxel   **Always confirm dose/schedule in your pharmacy ordering system**  Patient Characteristics: Stage IV Metastatic, Squamous, Molecular Analysis Completed, Alteration Present and Targeted Therapy Exhausted or EGFR Exon 20 Insertion or KRAS G12C or HER2 Present, and No Prior Chemo/Immunotherapy or No Alteration Present, PS = 0, 1, Third Line -  Chemotherapy/Immunotherapy, Prior PD-1/PD-L1 Inhibitor or No Prior PD-1/PD-L1 Inhibitor and Not a Candidate for Immunotherapy Therapeutic Status: Stage IV Metastatic Histology: Squamous Cell Molecular Analysis Results: No Alteration Present ECOG Performance Status: 1 Chemotherapy/Immunotherapy Line of Therapy: Third Line Chemotherapy/Immunotherapy Immunotherapy Candidate Status: Not a Candidate for Immunotherapy Prior Immunotherapy Status: Prior PD-1/PD-L1 Inhibitor Intent of Therapy: Non-Curative / Palliative Intent, Discussed with Patient

## 2021-04-25 ENCOUNTER — Other Ambulatory Visit: Payer: Self-pay

## 2021-04-25 ENCOUNTER — Inpatient Hospital Stay (HOSPITAL_BASED_OUTPATIENT_CLINIC_OR_DEPARTMENT_OTHER): Payer: Medicare HMO | Admitting: Oncology

## 2021-04-25 ENCOUNTER — Inpatient Hospital Stay: Payer: Medicare HMO

## 2021-04-25 VITALS — HR 115 | Resp 18 | Wt 170.2 lb

## 2021-04-25 DIAGNOSIS — J90 Pleural effusion, not elsewhere classified: Secondary | ICD-10-CM

## 2021-04-25 DIAGNOSIS — Z5111 Encounter for antineoplastic chemotherapy: Secondary | ICD-10-CM | POA: Diagnosis not present

## 2021-04-25 DIAGNOSIS — Z79899 Other long term (current) drug therapy: Secondary | ICD-10-CM | POA: Diagnosis not present

## 2021-04-25 DIAGNOSIS — Z5112 Encounter for antineoplastic immunotherapy: Secondary | ICD-10-CM | POA: Diagnosis not present

## 2021-04-25 DIAGNOSIS — R634 Abnormal weight loss: Secondary | ICD-10-CM | POA: Diagnosis not present

## 2021-04-25 DIAGNOSIS — C3431 Malignant neoplasm of lower lobe, right bronchus or lung: Secondary | ICD-10-CM | POA: Diagnosis not present

## 2021-04-25 DIAGNOSIS — D701 Agranulocytosis secondary to cancer chemotherapy: Secondary | ICD-10-CM | POA: Diagnosis not present

## 2021-04-25 DIAGNOSIS — C3492 Malignant neoplasm of unspecified part of left bronchus or lung: Secondary | ICD-10-CM

## 2021-04-25 LAB — CMP (CANCER CENTER ONLY)
ALT: 61 U/L — ABNORMAL HIGH (ref 0–44)
AST: 35 U/L (ref 15–41)
Albumin: 3 g/dL — ABNORMAL LOW (ref 3.5–5.0)
Alkaline Phosphatase: 117 U/L (ref 38–126)
Anion gap: 10 (ref 5–15)
BUN: 9 mg/dL (ref 8–23)
CO2: 27 mmol/L (ref 22–32)
Calcium: 9.2 mg/dL (ref 8.9–10.3)
Chloride: 100 mmol/L (ref 98–111)
Creatinine: 0.81 mg/dL (ref 0.61–1.24)
GFR, Estimated: >60 mL/min (ref >60)
Glucose, Bld: 137 mg/dL — ABNORMAL HIGH (ref 70–99)
Potassium: 3.6 mmol/L (ref 3.5–5.1)
Sodium: 137 mmol/L (ref 135–145)
Total Bilirubin: 1.4 mg/dL — ABNORMAL HIGH (ref 0.3–1.2)
Total Protein: 6.4 g/dL — ABNORMAL LOW (ref 6.5–8.1)

## 2021-04-25 LAB — CBC WITH DIFFERENTIAL (CANCER CENTER ONLY)
Abs Immature Granulocytes: 0.01 10*3/uL (ref 0.00–0.07)
Basophils Absolute: 0 10*3/uL (ref 0.0–0.1)
Basophils Relative: 0 %
Eosinophils Absolute: 0.1 10*3/uL (ref 0.0–0.5)
Eosinophils Relative: 2 %
HCT: 32.6 % — ABNORMAL LOW (ref 39.0–52.0)
Hemoglobin: 10.9 g/dL — ABNORMAL LOW (ref 13.0–17.0)
Immature Granulocytes: 0 %
Lymphocytes Relative: 20 %
Lymphs Abs: 0.5 10*3/uL — ABNORMAL LOW (ref 0.7–4.0)
MCH: 29.7 pg (ref 26.0–34.0)
MCHC: 33.4 g/dL (ref 30.0–36.0)
MCV: 88.8 fL (ref 80.0–100.0)
Monocytes Absolute: 0.3 10*3/uL (ref 0.1–1.0)
Monocytes Relative: 11 %
Neutro Abs: 1.7 10*3/uL (ref 1.7–7.7)
Neutrophils Relative %: 67 %
Platelet Count: 229 10*3/uL (ref 150–400)
RBC: 3.67 MIL/uL — ABNORMAL LOW (ref 4.22–5.81)
RDW: 20.6 % — ABNORMAL HIGH (ref 11.5–15.5)
WBC Count: 2.6 10*3/uL — ABNORMAL LOW (ref 4.0–10.5)
nRBC: 0 % (ref 0.0–0.2)

## 2021-04-25 NOTE — Progress Notes (Signed)
OK to treat 04/30/21 with labs from 04/25/21 per Dr Alen Blew.

## 2021-04-25 NOTE — Progress Notes (Signed)
Hematology and Oncology Follow Up Visit  Clifford [name] [id number] 25-Feb-1946 75 y.o. 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  Prior Therapy: Status post pleurodesis and biopsy of the pleural-based nodule. Received 6 cycles of CVP with rituximab. Therapy concluded in December 2007 after achieving remission at the time. 3.     Definitive therapy with radiation and weekly chemotherapy utilizing carboplatin and paclitaxel started on August 20, 2020.  He completed 6 cycles of therapy on September 24, 2020. 4.   Nivolumab 480 mg monthly started in June 2022.  He completed 3 cycles of therapy in August 2022 with progression of disease. 5.   Gemcitabine 1000 mg per metered square with Navelbine 25 mg per metered square day 1, day 8.  Started on February 26, 2021.  He completed 3 cycles of therapy.  Therapy discontinued because of progression of disease.  Current therapy: Under evaluation to start salvage therapy with Taxotere and Cyramza.  Interim History:  Mr. Clifford [name] presents today for a follow-up visit.  Since the last visit, he has reports a few complaints.  He has reported more shortness of breath and dyspnea on exertion and continues to have nonproductive cough.  He has also reported decrease in his weight and appetite.  He still works part-time and still enjoys that but has been decreasing on the amount of hours.  He denies any bone pain or pathological fractures.  He denies any recent hospitalizations or illnesses.    Medications: Reviewed without changes. Current Outpatient Medications  Medication Sig Dispense Refill   albuterol (VENTOLIN HFA) 108 (90 Base) MCG/ACT inhaler Inhale 2 puffs into the lungs every 6 (six) hours as needed for wheezing or  shortness of breath. 8 g 6   amLODipine (NORVASC) 5 MG tablet Take 5 mg by mouth daily.      aspirin 81 MG tablet Take 81 mg by mouth daily.     atorvastatin (LIPITOR) 10 MG tablet Take 10 mg by mouth daily.     azithromycin (ZITHROMAX Z-PAK) 250 MG tablet Take 2 tabs today, then 1 tab until gone 6 each 0   benzonatate (TESSALON) 100 MG capsule Take 100-200 mg by mouth 3 (three) times daily as needed for cough.     cetirizine (ZYRTEC) 10 MG tablet Take 10 mg by mouth daily as needed for allergies.     chlorpheniramine-HYDROcodone (TUSSIONEX PENNKINETIC ER) 10-8 MG/5ML SUER Take 5 mLs by mouth every 12 (twelve) hours as needed for cough. 473 mL 0   fluticasone (FLONASE) 50 MCG/ACT nasal spray Place 2 sprays into both nostrils daily. 16 g 2   gabapentin (NEURONTIN) 300 MG capsule Take 1 capsule (300 mg total) by mouth 2 (two) times daily. (Patient taking differently: Take 300 mg by mouth 3 (three) times daily.) 60 capsule 3   hydrochlorothiazide (HYDRODIURIL) 25 MG tablet Take 25 mg by mouth daily.     HYDROcodone bit-homatropine (HYCODAN) 5-1.5 MG/5ML syrup Take 5 mLs by mouth every 6 (six) hours as needed for cough. 473 mL 0   HYDROcodone-homatropine (HYCODAN) 5-1.5 MG/5ML syrup Take 5 mLs by mouth every 6 (six) hours as needed for cough. 473 mL 0   megestrol (MEGACE) 400 MG/10ML suspension Take 10 mLs (400 mg total) by mouth daily. 240 mL 1   potassium  chloride SA (KLOR-CON) 20 MEQ tablet Take 1 tablet (20 mEq total) by mouth daily for 14 days. 14 tablet 0   predniSONE (DELTASONE) 10 MG tablet Take 4 tablets X 3 days, 3 tabs X 3 days, 2 tabs x 3 days, 1 tab x 3 days 30 tablet 0   prochlorperazine (COMPAZINE) 10 MG tablet Take 1 tablet (10 mg total) by mouth every 6 (six) hours as needed for nausea or vomiting. 30 tablet 0   sucralfate (CARAFATE) 1 g tablet Take 1 tablet (1 g total) by mouth 4 (four) times daily. Dissolve each tablet in 15 cc water before use. 120 tablet 1   traMADol (ULTRAM) 50 MG  tablet Take 50 mg by mouth daily as needed for pain.     No current facility-administered medications for this visit.    Allergies: No Known Allergies     Physical Exam:       Pulse (!) 115, resp. rate 18, weight 170 lb 4 oz (77.2 kg), SpO2 91 %.     ECOG: 1   General appearance: Comfortable appearing without any discomfort Head: Normocephalic without any trauma Oropharynx: Mucous membranes are moist and pink without any thrush or ulcers. Eyes: Pupils are equal and round reactive to light. Lymph nodes: No cervical, supraclavicular, inguinal or axillary lymphadenopathy.   Heart:regular rate and rhythm.  S1 and S2 without leg edema. Lung: Decreased breath sounds on the right.  Expiratory wheezes noted. Abdomin: Soft, nontender, nondistended with good bowel sounds.  No hepatosplenomegaly. Musculoskeletal: No joint deformity or effusion.  Full range of motion noted. Neurological: No deficits noted on motor, sensory and deep tendon reflex exam. Skin: No petechial rash or dryness.  Appeared moist.       Lab Results: Lab Results  Component Value Date   WBC 2.7 (L) 04/16/2021   HGB 10.9 (L) 04/16/2021   HCT 33.0 (L) 04/16/2021   MCV 86.6 04/16/2021   PLT 170 04/16/2021     Chemistry      Component Value Date/Time   NA 138 04/16/2021 0750   NA 144 10/21/2016 0808   K 2.6 (LL) 04/16/2021 0750   K 4.0 10/21/2016 0808   CL 98 04/16/2021 0750   CL 109 (H) 10/26/2012 0841   CO2 28 04/16/2021 0750   CO2 29 10/21/2016 0808   BUN 11 04/16/2021 0750   BUN 15.3 10/21/2016 0808   CREATININE 0.75 04/16/2021 0750   CREATININE 1.0 10/21/2016 0808      Component Value Date/Time   CALCIUM 8.9 04/16/2021 0750   CALCIUM 9.3 10/21/2016 0808   ALKPHOS 105 04/16/2021 0750   ALKPHOS 53 10/21/2016 0808   AST 56 (H) 04/16/2021 0750   AST 27 10/21/2016 0808   ALT 79 (H) 04/16/2021 0750   ALT 37 10/21/2016 0808   BILITOT 1.0 04/16/2021 0750   BILITOT 1.09 10/21/2016 0808         IMPRESSION: 1. Persistent large necrotic appearing right hilar and infrahilar mass some of which is obstructed and necrotic appearing lung. Some of this is also likely radiation change. 2. Significant interval progression of pulmonary metastatic disease. 3. Enlarging right-sided pleural effusion. 4. No findings for abdominal/pelvic metastatic disease. 5. Stable mild splenomegaly. 6. Stable enlarged prostate gland with median lobe hypertrophy exerting significant mass effect on the base of the bladder. 7. Stable muscular thickening of the pylorus without findings for gastric outlet obstruction.     Impression and Plan:  75 year old with:   1. Stage IV adenosquamous  of the lung with pulmonary nodules and no evidence of other visceral metastasis.   He is a status post 3 cycles of chemotherapy utilizing gemcitabine and Navelbine with disease progression documented on CT scan on April 23, 2021.  Treatment options moving forward were discussed with treatments with carboplatin and paclitaxel versus Taxotere with Cyramza were reviewed.  Complication associated with these treatments include nausea, vomiting, myelosuppression, neutropenia and possible sepsis.  Complication associated with VEGF monoclonal antibodies were discussed today including hypertension bleeding issues were reviewed.  After discussion today, he is agreeable to proceed.  2.  Weight loss: We discussed strategies to improve his nutrition intake.  He is prescribed Megace but he has not taken it today encouraged him to do so.  3.  IV access: Risks and benefits of a Port-A-Cath insertion versus continuing peripheral veins were reviewed.  4.  Antiemetics: No nausea or vomiting reported at this time.  Compazine is available to him.  5.  Cough and pulmonary considerations: He does have a large right-sided pleural effusion.  Thoracentesis not be helpful to alleviate some of the symptoms and we will arrange for that in the  near future.   6.  Goals of care and prognosis: Therapy remains palliative although aggressive measures are warranted given his reasonable performance status.  7.  Neutropenia: He is at risk of developing neutropenia and possible sepsis and will receive growth factor support after each cycle of therapy.  Laboratory data from today reviewed and showed adequate hematological parameters and will proceed with chemotherapy without any delay.  8.  Follow-up: He will return next week for the start of cycle 1 of therapy.   30  minutes were spent on this encounter.  The time was dedicated to reviewing imaging studies, treatment choices and outlining future plan of care.  He had come  Zola Button, MD 11/18/20222:47 PM

## 2021-04-28 NOTE — Progress Notes (Signed)
The following biosimilar Udenyca ([id number]) has been selected for use in this patient per PA team for insurance billing.   Henreitta Leber, PharmD 04/28/21 @ [phone]

## 2021-04-29 MED FILL — Dexamethasone Sodium Phosphate Inj 100 MG/10ML: INTRAMUSCULAR | Qty: 1 | Status: AC

## 2021-04-30 ENCOUNTER — Ambulatory Visit (HOSPITAL_COMMUNITY)
Admission: RE | Admit: 2021-04-30 | Discharge: 2021-04-30 | Disposition: A | Discharge status: Home / Self Care | Payer: Medicare HMO | Source: Ambulatory Visit | Attending: Oncology | Admitting: Oncology

## 2021-04-30 ENCOUNTER — Other Ambulatory Visit: Payer: Self-pay

## 2021-04-30 ENCOUNTER — Inpatient Hospital Stay: Payer: Medicare HMO

## 2021-04-30 ENCOUNTER — Ambulatory Visit (HOSPITAL_COMMUNITY)
Admission: RE | Admit: 2021-04-30 | Discharge: 2021-04-30 | Disposition: A | Discharge status: Home / Self Care | Payer: Medicare HMO | Source: Ambulatory Visit | Attending: Physician Assistant | Admitting: Physician Assistant

## 2021-04-30 VITALS — BP 123/78 | HR 93 | Temp 97.7°F | Resp 18 | Wt 165.2 lb

## 2021-04-30 DIAGNOSIS — C3431 Malignant neoplasm of lower lobe, right bronchus or lung: Secondary | ICD-10-CM

## 2021-04-30 DIAGNOSIS — Z9889 Other specified postprocedural states: Secondary | ICD-10-CM

## 2021-04-30 DIAGNOSIS — Z5112 Encounter for antineoplastic immunotherapy: Secondary | ICD-10-CM | POA: Diagnosis not present

## 2021-04-30 DIAGNOSIS — R634 Abnormal weight loss: Secondary | ICD-10-CM | POA: Diagnosis not present

## 2021-04-30 DIAGNOSIS — Z79899 Other long term (current) drug therapy: Secondary | ICD-10-CM | POA: Diagnosis not present

## 2021-04-30 DIAGNOSIS — J90 Pleural effusion, not elsewhere classified: Secondary | ICD-10-CM | POA: Diagnosis not present

## 2021-04-30 DIAGNOSIS — R918 Other nonspecific abnormal finding of lung field: Secondary | ICD-10-CM | POA: Diagnosis not present

## 2021-04-30 DIAGNOSIS — C3490 Malignant neoplasm of unspecified part of unspecified bronchus or lung: Secondary | ICD-10-CM | POA: Diagnosis not present

## 2021-04-30 DIAGNOSIS — Z5111 Encounter for antineoplastic chemotherapy: Secondary | ICD-10-CM | POA: Diagnosis not present

## 2021-04-30 DIAGNOSIS — D701 Agranulocytosis secondary to cancer chemotherapy: Secondary | ICD-10-CM | POA: Diagnosis not present

## 2021-04-30 MED ORDER — DIPHENHYDRAMINE HCL 50 MG/ML IJ SOLN
50.0000 mg | Freq: Once | INTRAMUSCULAR | Status: AC
  Administered 2021-04-30: 50 mg via INTRAVENOUS
  Filled 2021-04-30: qty 1

## 2021-04-30 MED ORDER — LIDOCAINE HCL 1 % IJ SOLN
INTRAMUSCULAR | Status: AC
  Administered 2021-04-30: 10 mL
  Filled 2021-04-30: qty 20

## 2021-04-30 MED ORDER — SODIUM CHLORIDE 0.9 % IV SOLN: Freq: Once | INTRAVENOUS | Status: AC

## 2021-04-30 MED ORDER — ACETAMINOPHEN 325 MG PO TABS
650.0000 mg | ORAL_TABLET | Freq: Once | ORAL | Status: AC
  Administered 2021-04-30: 650 mg via ORAL
  Filled 2021-04-30: qty 2

## 2021-04-30 MED ORDER — SODIUM CHLORIDE 0.9 % IV SOLN
10.0000 mg | Freq: Once | INTRAVENOUS | Status: AC
  Administered 2021-04-30: 10 mg via INTRAVENOUS
  Filled 2021-04-30: qty 10

## 2021-04-30 MED ORDER — SODIUM CHLORIDE 0.9 % IV SOLN
75.0000 mg/m2 | Freq: Once | INTRAVENOUS | Status: AC
  Administered 2021-04-30: 150 mg via INTRAVENOUS
  Filled 2021-04-30: qty 15

## 2021-04-30 MED ORDER — SODIUM CHLORIDE 0.9 % IV SOLN
10.0000 mg/kg | Freq: Once | INTRAVENOUS | Status: AC
  Administered 2021-04-30: 800 mg via INTRAVENOUS
  Filled 2021-04-30: qty 30

## 2021-04-30 NOTE — Patient Instructions (Signed)
[facility]  Discharge Instructions: Thank you for choosing [facility] to provide your oncology and hematology care.   If you have a lab appointment with the Cancer Center, please go directly to the Cancer Center and check in at the registration area.   Wear comfortable clothing and clothing appropriate for easy access to any Portacath or PICC line.   We strive to give you quality time with your provider. You may need to reschedule your appointment if you arrive late (15 or more minutes).  Arriving late affects you and other patients whose appointments are after yours.  Also, if you miss three or more appointments without notifying the office, you may be dismissed from the clinic at the provider's discretion.      For prescription refill requests, have your pharmacy contact our office and allow 72 hours for refills to be completed.    Today you received the following chemotherapy and/or immunotherapy agents Cyramza and taxotere      To help prevent nausea and vomiting after your treatment, we encourage you to take your nausea medication as directed.  BELOW ARE SYMPTOMS THAT SHOULD BE REPORTED IMMEDIATELY: *FEVER GREATER THAN 100.4 F (38 C) OR HIGHER *CHILLS OR SWEATING *NAUSEA AND VOMITING THAT IS NOT CONTROLLED WITH YOUR NAUSEA MEDICATION *UNUSUAL SHORTNESS OF BREATH *UNUSUAL BRUISING OR BLEEDING *URINARY PROBLEMS (pain or burning when urinating, or frequent urination) *BOWEL PROBLEMS (unusual diarrhea, constipation, pain near the anus) TENDERNESS IN MOUTH AND THROAT WITH OR WITHOUT PRESENCE OF ULCERS (sore throat, sores in mouth, or a toothache) UNUSUAL RASH, SWELLING OR PAIN  UNUSUAL VAGINAL DISCHARGE OR ITCHING   Items with * indicate a potential emergency and should be followed up as soon as possible or go to the Emergency Department if any problems should occur.  Please show the CHEMOTHERAPY ALERT CARD or IMMUNOTHERAPY ALERT CARD at  check-in to the Emergency Department and triage nurse.  Should you have questions after your visit or need to cancel or reschedule your appointment, please contact [facility]  Dept: [phone]  and follow the prompts.  Office hours are 8:00 a.m. to 4:30 p.m. Monday - Friday. Please note that voicemails left after 4:00 p.m. may not be returned until the following business day.  We are closed weekends and major holidays. You have access to a nurse at all times for urgent questions. Please call the main number to the clinic Dept: [phone] and follow the prompts.   For any non-urgent questions, you may also contact your provider using MyChart. We now offer e-Visits for anyone 18 and older to request care online for non-urgent symptoms. For details visit [email or web address].   Also download the MyChart app! Go to the app store, search "MyChart", open the app, select [facility], and log in with your MyChart username and password.  Due to Covid, a mask is required upon entering the hospital/clinic. If you do not have a mask, one will be given to you upon arrival. For doctor visits, patients may have 1 support person aged 18 or older with them. For treatment visits, patients cannot have anyone with them due to current Covid guidelines and our immunocompromised population.   

## 2021-04-30 NOTE — Progress Notes (Signed)
OK to proceed w/o urine protein per Dr. Julien Nordmann (Dr. Alen Blew on [facility]). Msg sent to Dr. Alen Blew to order future urine proteins while on Cyramza.  Kennith Center, Pharm.D., CPP 04/30/2021@3 :00 PM

## 2021-04-30 NOTE — Procedures (Signed)
PROCEDURE SUMMARY:  Successful US guided right thoracentesis. Yielded 1.9L of yellow fluid. Pt tolerated procedure well. No immediate complications.  Specimen not sent for labs. CXR ordered.  EBL < 5 mL  Verlon Pischke PA-C 04/30/2021 12:23 PM

## 2021-04-30 NOTE — Progress Notes (Signed)
Per Dr. Julien Nordmann, ok to treat with elevated HR.

## 2021-05-02 ENCOUNTER — Inpatient Hospital Stay: Payer: Medicare HMO

## 2021-05-02 ENCOUNTER — Other Ambulatory Visit: Payer: Self-pay

## 2021-05-02 VITALS — BP 130/94 | HR 100 | Temp 98.5°F | Resp 20

## 2021-05-02 DIAGNOSIS — D701 Agranulocytosis secondary to cancer chemotherapy: Secondary | ICD-10-CM | POA: Diagnosis not present

## 2021-05-02 DIAGNOSIS — C3431 Malignant neoplasm of lower lobe, right bronchus or lung: Secondary | ICD-10-CM | POA: Diagnosis not present

## 2021-05-02 DIAGNOSIS — Z5112 Encounter for antineoplastic immunotherapy: Secondary | ICD-10-CM | POA: Diagnosis not present

## 2021-05-02 DIAGNOSIS — Z5111 Encounter for antineoplastic chemotherapy: Secondary | ICD-10-CM | POA: Diagnosis not present

## 2021-05-02 DIAGNOSIS — Z79899 Other long term (current) drug therapy: Secondary | ICD-10-CM | POA: Diagnosis not present

## 2021-05-02 DIAGNOSIS — R634 Abnormal weight loss: Secondary | ICD-10-CM | POA: Diagnosis not present

## 2021-05-02 MED ORDER — PEGFILGRASTIM-CBQV 6 MG/0.6ML ~~LOC~~ SOSY
6.0000 mg | PREFILLED_SYRINGE | Freq: Once | SUBCUTANEOUS | Status: AC
  Administered 2021-05-02: 6 mg via SUBCUTANEOUS
  Filled 2021-05-02: qty 0.6

## 2021-05-02 NOTE — Patient Instructions (Signed)

## 2021-05-07 ENCOUNTER — Encounter: Payer: Medicare HMO | Admitting: Dietician

## 2021-05-07 ENCOUNTER — Ambulatory Visit: Payer: Medicare HMO

## 2021-05-07 ENCOUNTER — Other Ambulatory Visit: Payer: Medicare HMO

## 2021-05-09 ENCOUNTER — Telehealth: Payer: Self-pay | Admitting: *Deleted

## 2021-05-09 ENCOUNTER — Telehealth: Payer: Self-pay | Admitting: Dietician

## 2021-05-09 ENCOUNTER — Other Ambulatory Visit: Payer: Self-pay | Admitting: *Deleted

## 2021-05-09 DIAGNOSIS — C3431 Malignant neoplasm of lower lobe, right bronchus or lung: Secondary | ICD-10-CM

## 2021-05-09 MED ORDER — ONDANSETRON HCL 8 MG PO TABS
8.0000 mg | ORAL_TABLET | Freq: Three times a day (TID) | ORAL | 1 refills | Status: AC | PRN
Start: 2021-05-09 — End: ?

## 2021-05-09 NOTE — Telephone Encounter (Signed)
Received secure chat from RN Fransisco Hertz) at Dr. Hazeline Junker office with request to contact patient prior to scheduled nutrition appointment. Per RN, pt contacted office this morning reporting poor intake, altered taste and smell, nausea with few episodes of vomiting. Spoke with patient this afternoon. He reports symptoms have progressed over last 3 weeks, the worst after starting new chemotherapy regimen this past  Wednesday. Patient reports he has an appetite and wants to eat, but food smells/taste bad. Patient is not taking appetite stimulant (Megace). Patient reports he can not stand to be around the smell of food cooking, this makes him very nauseas. Patient has tried bland foods (eggs, potatoes, chicken) but these taste bad and become gritty as he  chews. He gags with intake of water. Patient drinking one Boost Original, ginger ale, small amount of Gatorade. He can eat chocolate pudding. Patient reports taking compazine as prescribed every 6 hours yesterday, had episode of vomiting within 30 minutes of taking each time. Patient reports 8 "small" episodes of diarrhea since last night.   Patient reports 160.1 lb today on home scale. Per chart he weighed 165.2 lb on 11/23. Weights have decreased 15 lbs (8.6%) in the last month; significant  11/9 - 173 lb 8 oz 11/2 - 175 lb 10/11 - 177 lb 9/28 - 180 lb 6.4 oz  Intervention: Attempted to get patient scheduled for IV hydration. Per secure chat with RN Fransisco Hertz), infusion and [facility] booked today, infusion booked tomorrow as well Patient instructed to hydrate orally as best he can and report to ED if not improved over the weekend per MD - patient agreeable to this Prescription for Zofran ODT has been called in Educated on strategies for nausea, encouraged cold/room temperature foods, discussed foods to avoid - handout has been mailed Discussed strategies for altered taste and educated on importance of oral care - pt brushing teeth every other day - handout  has been mailed Suggested baking soda, salt water rinses several times daily before meal times, recipe provided Recommend patient drink 4-5 Boost/day and switching to Boost Plus for added calories Patient has contact information  Next Visit: f/u Monday December 5 via telephone

## 2021-05-09 NOTE — Telephone Encounter (Signed)
Received PC from patient - states he doesn't have an appetite at all since starting his new chemo regimen, states his food becomes very grainy with chewing, he has had some vomiting & has lost some weight, also having a hard time drinking fluids.  Patient states he is not taking his megace to increase his appetite because he is having issues with food textures & smells.  Elvia Collum, RD informed, she is calling patient today.

## 2021-05-12 ENCOUNTER — Telehealth (HOSPITAL_COMMUNITY): Payer: Self-pay | Admitting: Dietician

## 2021-05-12 NOTE — Telephone Encounter (Signed)
Brief follow-up with patient via telephone.  Patient appreciative of call, reports he is feeling some better. Nausea has improved, he has not needed to take zofran. Patient reports drinking more, recalls 16 oz water, 2 Boost, Propell, 2-3 (6oz) glasses Gatorade, orange juice. Patient reports small bites of foods over the weekend. He has tolerated spoonfuls of peanut butter, chocolate ice cream, few bites of an apple fritter.   Intervention: Encouraged small bites/nibbles every 2 hours of high calorie, high protein foods Suggesting increasing Boost supplement, recommend 4/day until eating more orally Handout and fact sheets mailed out 12/2 Patient has contact information   Next Visit: Wednesday December 14 during infusion

## 2021-05-20 MED FILL — Dexamethasone Sodium Phosphate Inj 100 MG/10ML: INTRAMUSCULAR | Qty: 1 | Status: AC

## 2021-05-21 ENCOUNTER — Inpatient Hospital Stay: Payer: Medicare HMO | Admitting: Dietician

## 2021-05-21 ENCOUNTER — Inpatient Hospital Stay: Payer: Medicare HMO

## 2021-05-21 ENCOUNTER — Inpatient Hospital Stay (HOSPITAL_BASED_OUTPATIENT_CLINIC_OR_DEPARTMENT_OTHER): Payer: Medicare HMO | Admitting: Oncology

## 2021-05-21 ENCOUNTER — Other Ambulatory Visit: Payer: Self-pay

## 2021-05-21 ENCOUNTER — Inpatient Hospital Stay: Payer: Medicare HMO | Attending: Oncology

## 2021-05-21 VITALS — BP 142/73 | HR 121 | Temp 98.1°F | Resp 18 | Wt 164.7 lb

## 2021-05-21 VITALS — BP 112/75 | HR 95 | Resp 18

## 2021-05-21 DIAGNOSIS — C3492 Malignant neoplasm of unspecified part of left bronchus or lung: Secondary | ICD-10-CM

## 2021-05-21 DIAGNOSIS — Z23 Encounter for immunization: Secondary | ICD-10-CM | POA: Diagnosis not present

## 2021-05-21 DIAGNOSIS — C3431 Malignant neoplasm of lower lobe, right bronchus or lung: Secondary | ICD-10-CM

## 2021-05-21 DIAGNOSIS — Z79899 Other long term (current) drug therapy: Secondary | ICD-10-CM | POA: Diagnosis not present

## 2021-05-21 DIAGNOSIS — Z5112 Encounter for antineoplastic immunotherapy: Secondary | ICD-10-CM | POA: Diagnosis not present

## 2021-05-21 DIAGNOSIS — D701 Agranulocytosis secondary to cancer chemotherapy: Secondary | ICD-10-CM | POA: Diagnosis not present

## 2021-05-21 DIAGNOSIS — Z5111 Encounter for antineoplastic chemotherapy: Secondary | ICD-10-CM | POA: Diagnosis not present

## 2021-05-21 LAB — CBC WITH DIFFERENTIAL (CANCER CENTER ONLY)
Abs Immature Granulocytes: 0.06 10*3/uL (ref 0.00–0.07)
Basophils Absolute: 0.1 10*3/uL (ref 0.0–0.1)
Basophils Relative: 1 %
Eosinophils Absolute: 0 10*3/uL (ref 0.0–0.5)
Eosinophils Relative: 1 %
HCT: 40.7 % (ref 39.0–52.0)
Hemoglobin: 12.9 g/dL — ABNORMAL LOW (ref 13.0–17.0)
Immature Granulocytes: 1 %
Lymphocytes Relative: 16 %
Lymphs Abs: 1.3 10*3/uL (ref 0.7–4.0)
MCH: 29.7 pg (ref 26.0–34.0)
MCHC: 31.7 g/dL (ref 30.0–36.0)
MCV: 93.6 fL (ref 80.0–100.0)
Monocytes Absolute: 0.5 10*3/uL (ref 0.1–1.0)
Monocytes Relative: 7 %
Neutro Abs: 6.3 10*3/uL (ref 1.7–7.7)
Neutrophils Relative %: 74 %
Platelet Count: 266 10*3/uL (ref 150–400)
RBC: 4.35 MIL/uL (ref 4.22–5.81)
RDW: 17.5 % — ABNORMAL HIGH (ref 11.5–15.5)
WBC Count: 8.3 10*3/uL (ref 4.0–10.5)
nRBC: 0 % (ref 0.0–0.2)

## 2021-05-21 LAB — CMP (CANCER CENTER ONLY)
ALT: 18 U/L (ref 0–44)
AST: 21 U/L (ref 15–41)
Albumin: 2.9 g/dL — ABNORMAL LOW (ref 3.5–5.0)
Alkaline Phosphatase: 103 U/L (ref 38–126)
Anion gap: 9 (ref 5–15)
BUN: 10 mg/dL (ref 8–23)
CO2: 26 mmol/L (ref 22–32)
Calcium: 9 mg/dL (ref 8.9–10.3)
Chloride: 104 mmol/L (ref 98–111)
Creatinine: 0.75 mg/dL (ref 0.61–1.24)
GFR, Estimated: >60 mL/min (ref >60)
Glucose, Bld: 97 mg/dL (ref 70–99)
Potassium: 3.9 mmol/L (ref 3.5–5.1)
Sodium: 139 mmol/L (ref 135–145)
Total Bilirubin: 0.8 mg/dL (ref 0.3–1.2)
Total Protein: 6.4 g/dL — ABNORMAL LOW (ref 6.5–8.1)

## 2021-05-21 LAB — TOTAL PROTEIN, URINE DIPSTICK: Protein, ur: 30 mg/dL — AB

## 2021-05-21 MED ORDER — SODIUM CHLORIDE 0.9 % IV SOLN
10.0000 mg | Freq: Once | INTRAVENOUS | Status: AC
  Administered 2021-05-21: 09:00:00 10 mg via INTRAVENOUS
  Filled 2021-05-21: qty 10

## 2021-05-21 MED ORDER — SODIUM CHLORIDE 0.9 % IV SOLN
75.0000 mg/m2 | Freq: Once | INTRAVENOUS | Status: AC
  Administered 2021-05-21: 11:00:00 150 mg via INTRAVENOUS
  Filled 2021-05-21: qty 15

## 2021-05-21 MED ORDER — HYDROCODONE BIT-HOMATROP MBR 5-1.5 MG/5ML PO SOLN: 5.0000 mL | Freq: Four times a day (QID) | ORAL | 0 refills | Status: AC | PRN

## 2021-05-21 MED ORDER — ACETAMINOPHEN 325 MG PO TABS
650.0000 mg | ORAL_TABLET | Freq: Once | ORAL | Status: AC
  Administered 2021-05-21: 09:00:00 650 mg via ORAL
  Filled 2021-05-21: qty 2

## 2021-05-21 MED ORDER — DIPHENHYDRAMINE HCL 50 MG/ML IJ SOLN
50.0000 mg | Freq: Once | INTRAMUSCULAR | Status: AC
  Administered 2021-05-21: 09:00:00 50 mg via INTRAVENOUS
  Filled 2021-05-21: qty 1

## 2021-05-21 MED ORDER — INFLUENZA VAC A&B SA ADJ QUAD 0.5 ML IM PRSY
0.5000 mL | PREFILLED_SYRINGE | Freq: Once | INTRAMUSCULAR | Status: AC
  Administered 2021-05-21: 09:00:00 0.5 mL via INTRAMUSCULAR
  Filled 2021-05-21: qty 0.5

## 2021-05-21 MED ORDER — SODIUM CHLORIDE 0.9 % IV SOLN
10.0000 mg/kg | Freq: Once | INTRAVENOUS | Status: AC
  Administered 2021-05-21: 10:00:00 800 mg via INTRAVENOUS
  Filled 2021-05-21: qty 50

## 2021-05-21 MED ORDER — SODIUM CHLORIDE 0.9 % IV SOLN: Freq: Once | INTRAVENOUS | Status: AC

## 2021-05-21 NOTE — Progress Notes (Signed)
Hematology and Oncology Follow Up Visit  Clifford [name] [id number] 01-07-1946 75 y.o. 05/21/2021 8:04 AM   Principle Diagnosis: 75 year old man with lung cancer diagnosed in 2022.  He developed stage IV disease with PD-L1 score of 0 and pulmonary nodules.   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  Prior Therapy: Status post pleurodesis and biopsy of the pleural-based nodule. Received 6 cycles of CVP with rituximab. Therapy concluded in December 2007 after achieving remission at the time. 3.     Definitive therapy with radiation and weekly chemotherapy utilizing carboplatin and paclitaxel started on August 20, 2020.  He completed 6 cycles of therapy on September 24, 2020. 4.   Nivolumab 480 mg monthly started in June 2022.  He completed 3 cycles of therapy in August 2022 with progression of disease. 5.   Gemcitabine 1000 mg per metered square with Navelbine 25 mg per metered square day 1, day 8.  Started on February 26, 2021.  He completed 3 cycles of therapy.  Therapy discontinued because of progression of disease.  Current therapy: Taxotere 75 mg per metered square with Cyramza 10 mg/kg started on April 30, 2021.  He is here for cycle 2 of therapy.  Interim History:  Clifford [name] returns today for repeat evaluation.  Since the last visit, he underwent thoracentesis with improvement in his cough and shortness of breath.  He is still quite dyspneic but his cough has improved.  His performance status remains stable but marginal.  He tolerated the last cycle of chemotherapy with few complaints including fatigue and loss of appetite.  He still able to eat reasonably fair and maintain his weight.    Medications: Updated on review. Current Outpatient Medications  Medication Sig Dispense Refill   albuterol (VENTOLIN HFA) 108 (90 Base) MCG/ACT inhaler Inhale 2 puffs into the lungs every 6 (six) hours as needed for wheezing or  shortness of breath. 8 g 6   amLODipine (NORVASC) 5 MG tablet Take 5 mg by mouth daily.      aspirin 81 MG tablet Take 81 mg by mouth daily.     atorvastatin (LIPITOR) 10 MG tablet Take 10 mg by mouth daily.     benzonatate (TESSALON) 100 MG capsule Take 100-200 mg by mouth 3 (three) times daily as needed for cough.     cetirizine (ZYRTEC) 10 MG tablet Take 10 mg by mouth daily as needed for allergies.     chlorpheniramine-HYDROcodone (TUSSIONEX PENNKINETIC ER) 10-8 MG/5ML SUER Take 5 mLs by mouth every 12 (twelve) hours as needed for cough. 473 mL 0   fluticasone (FLONASE) 50 MCG/ACT nasal spray Place 2 sprays into both nostrils daily. 16 g 2   gabapentin (NEURONTIN) 300 MG capsule Take 1 capsule (300 mg total) by mouth 2 (two) times daily. (Patient taking differently: Take 300 mg by mouth 3 (three) times daily.) 60 capsule 3   hydrochlorothiazide (HYDRODIURIL) 25 MG tablet Take 25 mg by mouth daily.     HYDROcodone bit-homatropine (HYCODAN) 5-1.5 MG/5ML syrup Take 5 mLs by mouth every 6 (six) hours as needed for cough. 473 mL 0   HYDROcodone-homatropine (HYCODAN) 5-1.5 MG/5ML syrup Take 5 mLs by mouth every 6 (six) hours as needed for cough. 473 mL 0   megestrol (MEGACE) 400 MG/10ML suspension Take 10 mLs (400 mg total) by mouth daily. 240 mL 1   ondansetron (ZOFRAN) 8 MG tablet Take 1 tablet (8 mg total) by mouth every 8 (eight) hours  as needed for nausea or vomiting. 20 tablet 1   potassium chloride SA (KLOR-CON) 20 MEQ tablet Take 1 tablet (20 mEq total) by mouth daily for 14 days. 14 tablet 0   prochlorperazine (COMPAZINE) 10 MG tablet Take 1 tablet (10 mg total) by mouth every 6 (six) hours as needed for nausea or vomiting. 30 tablet 0   sucralfate (CARAFATE) 1 g tablet Take 1 tablet (1 g total) by mouth 4 (four) times daily. Dissolve each tablet in 15 cc water before use. 120 tablet 1   traMADol (ULTRAM) 50 MG tablet Take 50 mg by mouth daily as needed for pain.     No current  facility-administered medications for this visit.    Allergies: No Known Allergies     Physical Exam:       Blood pressure (!) 142/73, pulse (!) 121, temperature 98.1 F (36.7 C), temperature source Tympanic, resp. rate 18, weight 164 lb 11.2 oz (74.7 kg), SpO2 93 %.      ECOG: 1   General appearance: Alert, awake without any distress. Head: Atraumatic without abnormalities Oropharynx: Without any thrush or ulcers. Eyes: No scleral icterus. Lymph nodes: No lymphadenopathy noted in the cervical, supraclavicular, or axillary nodes Heart:regular rate and rhythm, without any murmurs or gallops.   Lung: Scattered rhonchi and decrease breath sounds at the bases. Abdomin: Soft, nontender without any shifting dullness or ascites. Musculoskeletal: No clubbing or cyanosis. Neurological: No motor or sensory deficits. Skin: No rashes or lesions.       Lab Results: Lab Results  Component Value Date   WBC 2.6 (L) 04/25/2021   HGB 10.9 (L) 04/25/2021   HCT 32.6 (L) 04/25/2021   MCV 88.8 04/25/2021   PLT 229 04/25/2021     Chemistry      Component Value Date/Time   NA 137 04/25/2021 1447   NA 144 10/21/2016 0808   K 3.6 04/25/2021 1447   K 4.0 10/21/2016 0808   CL 100 04/25/2021 1447   CL 109 (H) 10/26/2012 0841   CO2 27 04/25/2021 1447   CO2 29 10/21/2016 0808   BUN 9 04/25/2021 1447   BUN 15.3 10/21/2016 0808   CREATININE 0.81 04/25/2021 1447   CREATININE 1.0 10/21/2016 0808      Component Value Date/Time   CALCIUM 9.2 04/25/2021 1447   CALCIUM 9.3 10/21/2016 0808   ALKPHOS 117 04/25/2021 1447   ALKPHOS 53 10/21/2016 0808   AST 35 04/25/2021 1447   AST 27 10/21/2016 0808   ALT 61 (H) 04/25/2021 1447   ALT 37 10/21/2016 0808   BILITOT 1.4 (H) 04/25/2021 1447   BILITOT 1.09 10/21/2016 0808          Impression and Plan:  75 year old with:   1.  Lung cancer diagnosed in 2022.  He was found to have stage IV adenosquamous tumor without any  molecular markers.Marland Kitchen   He is currently on Taxotere with Cyramza and has completed the first cycle of therapy.  Risks and benefits of continuing this treatment were reviewed at this time.  Potential complications including peripheral neuropathy, nausea, vomiting and myelosuppression were reiterated.  He is agreeable to proceed at this time.   2.  Weight loss: We continue to work with him with a nutritional plan to boost his intake.  Weight is stable.  3.  IV access: Peripheral veins are currently in use at this time.  4.  Antiemetics: Compazine is available to him.  5.  Cough and pulmonary considerations: He is  status post right thoracentesis completed on April 30, 2021.  His respiratory status improved.  He has follow-up with Dr. Lamonte Sakai regarding further intervention of his pleural effusion.   6.  Goals of care and prognosis: His disease is incurable but aggressive measures are warranted given his reasonable performance status.  7.  Neutropenia: He will receive growth factor support after each cycle of therapy.  8.  Follow-up: In 3 weeks for the next cycle of therapy.   30  minutes were dedicated to this visit.  Time spent on reviewing laboratory disease status of addressing complications related to cancer and cancer therapy.  Zola Button, MD 12/14/20228:04 AM

## 2021-05-21 NOTE — Patient Instructions (Addendum)
[facility]   °Discharge Instructions: °Thank you for choosing [facility] to provide your oncology and hematology care.  ° °If you have a lab appointment with the Cancer Center, please go directly to the Cancer Center and check in at the registration area. °  °Wear comfortable clothing and clothing appropriate for easy access to any Portacath or PICC line.  ° °We strive to give you quality time with your provider. You may need to reschedule your appointment if you arrive late (15 or more minutes).  Arriving late affects you and other patients whose appointments are after yours.  Also, if you miss three or more appointments without notifying the office, you may be dismissed from the clinic at the provider’s discretion.    °  °For prescription refill requests, have your pharmacy contact our office and allow 72 hours for refills to be completed.   ° °Today you received the following chemotherapy and/or immunotherapy agents: Ramucirumab (Cyramza) and Docetaxel (Taxotere)    °  °To help prevent nausea and vomiting after your treatment, we encourage you to take your nausea medication as directed. ° °BELOW ARE SYMPTOMS THAT SHOULD BE REPORTED IMMEDIATELY: °*FEVER GREATER THAN 100.4 F (38 °C) OR HIGHER °*CHILLS OR SWEATING °*NAUSEA AND VOMITING THAT IS NOT CONTROLLED WITH YOUR NAUSEA MEDICATION °*UNUSUAL SHORTNESS OF BREATH °*UNUSUAL BRUISING OR BLEEDING °*URINARY PROBLEMS (pain or burning when urinating, or frequent urination) °*BOWEL PROBLEMS (unusual diarrhea, constipation, pain near the anus) °TENDERNESS IN MOUTH AND THROAT WITH OR WITHOUT PRESENCE OF ULCERS (sore throat, sores in mouth, or a toothache) °UNUSUAL RASH, SWELLING OR PAIN  °UNUSUAL VAGINAL DISCHARGE OR ITCHING  ° °Items with * indicate a potential emergency and should be followed up as soon as possible or go to the Emergency Department if any problems should occur. ° °Please show the CHEMOTHERAPY ALERT CARD or  IMMUNOTHERAPY ALERT CARD at check-in to the Emergency Department and triage nurse. ° °Should you have questions after your visit or need to cancel or reschedule your appointment, please contact [facility]  Dept: [phone]  and follow the prompts.  Office hours are 8:00 a.m. to 4:30 p.m. Monday - Friday. Please note that voicemails left after 4:00 p.m. may not be returned until the following business day.  We are closed weekends and major holidays. You have access to a nurse at all times for urgent questions. Please call the main number to the clinic Dept: [phone] and follow the prompts. ° ° °For any non-urgent questions, you may also contact your provider using MyChart. We now offer e-Visits for anyone 18 and older to request care online for non-urgent symptoms. For details visit [email or web address]. °  °Also download the MyChart app! Go to the app store, search "MyChart", open the app, select [facility], and log in with your MyChart username and password. ° °Due to Covid, a mask is required upon entering the hospital/clinic. If you do not have a mask, one will be given to you upon arrival. For doctor visits, patients may have 1 support person aged 18 or older with them. For treatment visits, patients cannot have anyone with them due to current Covid guidelines and our immunocompromised population.  ° °

## 2021-05-21 NOTE — Progress Notes (Signed)
Nutrition Follow-up:  Patient with stage IV lung cancer. He is currently receiving Taxotere with Cyramza.   Met with patient during infusion. He reports poor appetite and altered taste. Foods taste/feel like sand after a few bites and unable to swallow. Patient reports trying new foods everyday. Patient is tolerating sweet foods with smooth textures (pudding cups, ice cream). He has switched to Boost Plus for added calories, drinking 3/day. His wife purchased CIB powder, but he has not tried this. Patient is drinking ~1 quart of water, ginger ale, gatorade. He denies nausea, vomiting, diarrhea, constipation.    Medications: Tessalon, Tussionex, Gabapentin, Hycodan, Megace, Zofran, Klor-con, Carafate, Tramadol  Labs: reviewed   Anthropometrics: Weight 164 lb 11.2 oz today stable x 3 weeks.   11/23 - 165.2 lb 11/9 - 173.5 lb 11/2 - 175 lb  10/11 - 177 lb   NUTRITION DIAGNOSIS: Inadequate oral intake related to cancer and associated treatment side effects as evidenced by reported poor appetite, altered taste of foods, 5% (8.8 lb) weight loss in 5 weeks which is insignificant for time frame.    INTERVENTION:  Encouraged high calorie high protein foods for weight maintenance Provided ideas on soft smooth foods to try Discussed strategies for increasing calories and protein Continue drinking 3-4 Boost Plus/equivalent daily - coupons provided Encouraged pt to try CIB  Reviewed tips for nausea and importance of hydration - pt has handouts Continue baking soda salt water rinses several times/day Patient has contact information    MONITORING, EVALUATION, GOAL: weight trends, intake    NEXT VISIT: To be scheduled with treatment

## 2021-05-21 NOTE — Progress Notes (Signed)
[place] to treat w/ HR 113 per Dr. Alen Blew

## 2021-05-21 NOTE — Progress Notes (Signed)
OK to tx prior to urine protein result

## 2021-05-21 NOTE — Progress Notes (Signed)
Patient tolerated flu administration with no issues. Patient observed for 30-minutes without incident. Patient provided with educational packet and informed of potential side effects.

## 2021-05-22 ENCOUNTER — Ambulatory Visit (INDEPENDENT_AMBULATORY_CARE_PROVIDER_SITE_OTHER): Payer: Medicare HMO | Admitting: Emergency Medicine

## 2021-05-22 ENCOUNTER — Encounter: Payer: Self-pay | Admitting: Emergency Medicine

## 2021-05-22 DIAGNOSIS — R053 Chronic cough: Secondary | ICD-10-CM | POA: Diagnosis not present

## 2021-05-22 DIAGNOSIS — C3431 Malignant neoplasm of lower lobe, right bronchus or lung: Secondary | ICD-10-CM

## 2021-05-22 DIAGNOSIS — J449 Chronic obstructive pulmonary disease, unspecified: Secondary | ICD-10-CM | POA: Diagnosis not present

## 2021-05-22 MED ORDER — SPIRIVA RESPIMAT 1.25 MCG/ACT IN AERS: 2.0000 | INHALATION_SPRAY | Freq: Every day | RESPIRATORY_TRACT | 0 refills | Status: DC

## 2021-05-22 NOTE — Assessment & Plan Note (Signed)
With presumed malignant right pleural effusion.  He had thoracentesis on 11/23, did benefit and has maintained the benefit.  Unclear how long he will keep it because he will probably reaccumulate.  When he does then we can consider repeat thoracentesis.  If he is requiring frequently than we can consider Pleurx catheter.  I talked to him about this option today.  He will call if he has escalating dyspnea so we can check a chest x-ray and consider repeat Thora.

## 2021-05-22 NOTE — Addendum Note (Signed)
Addended by: Elby Beck R on: 05/22/2021 12:15 PM   Modules accepted: Orders

## 2021-05-22 NOTE — Assessment & Plan Note (Signed)
Presumed that COPD is a contributor to his shortness of breath.  We are going to do an empiric trial of Spiriva to see if he gets benefit.  If he does not we can continue it going forward.  He will continue his albuterol as needed.
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
## 2021-05-22 NOTE — Assessment & Plan Note (Signed)
His deep cough got better after his thoracentesis, likely after some lung reexpansion.  He is now having upper airway irritation.  He has chronic nasal congestion and is on fluticasone, loratadine.  Plan to continue these.

## 2021-05-22 NOTE — Patient Instructions (Addendum)
Please try starting Spiriva 2 puffs once daily.  Keep track of whether this medication helps your breathing.  If so we can continue it going forward. Keep your albuterol available to use 2 puffs up to every 4 hours if needed for shortness of breath, chest tightness, wheezing. I am glad that your breathing improved after you had your right thoracentesis.  If you have progressive shortness of breath then we should repeat your chest x-ray and consider repeat thoracentesis.  Depending on fluid accumulation, timing and frequency, we can consider a possible Pleurx catheter at some point in the future. Continue to follow with Dr. Alen Blew as planned. Follow with APP in 1 month. Follow Dr. Lamonte Sakai in 6 months or sooner if you have any problems.

## 2021-05-23 ENCOUNTER — Other Ambulatory Visit: Payer: Self-pay

## 2021-05-23 ENCOUNTER — Inpatient Hospital Stay: Payer: Medicare HMO

## 2021-05-23 DIAGNOSIS — Z23 Encounter for immunization: Secondary | ICD-10-CM | POA: Diagnosis not present

## 2021-05-23 DIAGNOSIS — Z5112 Encounter for antineoplastic immunotherapy: Secondary | ICD-10-CM | POA: Diagnosis not present

## 2021-05-23 DIAGNOSIS — C3431 Malignant neoplasm of lower lobe, right bronchus or lung: Secondary | ICD-10-CM | POA: Diagnosis not present

## 2021-05-23 DIAGNOSIS — D701 Agranulocytosis secondary to cancer chemotherapy: Secondary | ICD-10-CM | POA: Diagnosis not present

## 2021-05-23 DIAGNOSIS — Z79899 Other long term (current) drug therapy: Secondary | ICD-10-CM | POA: Diagnosis not present

## 2021-05-23 DIAGNOSIS — Z5111 Encounter for antineoplastic chemotherapy: Secondary | ICD-10-CM | POA: Diagnosis not present

## 2021-05-23 MED ORDER — PEGFILGRASTIM-CBQV 6 MG/0.6ML ~~LOC~~ SOSY
6.0000 mg | PREFILLED_SYRINGE | Freq: Once | SUBCUTANEOUS | Status: AC
  Administered 2021-05-23: 6 mg via SUBCUTANEOUS
  Filled 2021-05-23: qty 0.6

## 2021-05-28 ENCOUNTER — Telehealth: Payer: Self-pay | Admitting: *Deleted

## 2021-05-28 ENCOUNTER — Other Ambulatory Visit: Payer: Medicare HMO

## 2021-05-28 ENCOUNTER — Ambulatory Visit: Payer: Medicare HMO

## 2021-05-28 NOTE — Telephone Encounter (Signed)
PC to patient, no answer, left VM - informed patient of Dr. Hazeline Junker advice as below.  Instructed him to call this office with any further questions/concerns, office number given.

## 2021-05-28 NOTE — Telephone Encounter (Signed)
-----   Message from Wyatt Portela, MD sent at 05/28/2021 12:48 PM EST ----- This is likely due to chemo and will likely improve subsequently. He can take Tylenol or Advil to relieve the pain.  He needs something stronger to let us know. ----- Message ----- From: Rolene Course, RN Sent: 05/28/2021  12:22 PM EST To: Wyatt Portela, MD  Mr Dente called & said he is having a lot of pain & soreness in his thumbs, first & second fingers on each hand since yesterday.  He states they are very sore to the touch & he is having trouble using them. He denies any neuropathy sx's.  He is asking for your advice.  Thanks, Bethena Roys

## 2021-06-10 ENCOUNTER — Encounter: Payer: Self-pay | Admitting: Oncology

## 2021-06-10 MED FILL — Dexamethasone Sodium Phosphate Inj 100 MG/10ML: INTRAMUSCULAR | Qty: 1 | Status: AC

## 2021-06-11 ENCOUNTER — Encounter: Payer: Self-pay | Admitting: Oncology

## 2021-06-11 ENCOUNTER — Other Ambulatory Visit: Payer: Self-pay

## 2021-06-11 ENCOUNTER — Inpatient Hospital Stay: Payer: Medicare HMO

## 2021-06-11 ENCOUNTER — Inpatient Hospital Stay: Payer: Medicare HMO | Attending: Oncology

## 2021-06-11 ENCOUNTER — Inpatient Hospital Stay: Payer: Medicare HMO | Admitting: Oncology

## 2021-06-11 VITALS — BP 114/88 | HR 136 | Temp 98.1°F | Resp 19 | Wt 160.5 lb

## 2021-06-11 DIAGNOSIS — C3431 Malignant neoplasm of lower lobe, right bronchus or lung: Secondary | ICD-10-CM

## 2021-06-11 DIAGNOSIS — Z79899 Other long term (current) drug therapy: Secondary | ICD-10-CM | POA: Diagnosis not present

## 2021-06-11 DIAGNOSIS — Z5112 Encounter for antineoplastic immunotherapy: Secondary | ICD-10-CM | POA: Insufficient documentation

## 2021-06-11 DIAGNOSIS — Z5111 Encounter for antineoplastic chemotherapy: Secondary | ICD-10-CM | POA: Insufficient documentation

## 2021-06-11 DIAGNOSIS — Z5189 Encounter for other specified aftercare: Secondary | ICD-10-CM | POA: Insufficient documentation

## 2021-06-11 DIAGNOSIS — C3492 Malignant neoplasm of unspecified part of left bronchus or lung: Secondary | ICD-10-CM

## 2021-06-11 LAB — CMP (CANCER CENTER ONLY)
ALT: 11 U/L (ref 0–44)
AST: 21 U/L (ref 15–41)
Albumin: 3.5 g/dL (ref 3.5–5.0)
Alkaline Phosphatase: 114 U/L (ref 38–126)
Anion gap: 12 (ref 5–15)
BUN: 12 mg/dL (ref 8–23)
CO2: 26 mmol/L (ref 22–32)
Calcium: 9.4 mg/dL (ref 8.9–10.3)
Chloride: 97 mmol/L — ABNORMAL LOW (ref 98–111)
Creatinine: 0.69 mg/dL (ref 0.61–1.24)
GFR, Estimated: >60 mL/min (ref >60)
Glucose, Bld: 123 mg/dL — ABNORMAL HIGH (ref 70–99)
Potassium: 3.6 mmol/L (ref 3.5–5.1)
Sodium: 135 mmol/L (ref 135–145)
Total Bilirubin: 1.1 mg/dL (ref 0.3–1.2)
Total Protein: 6.8 g/dL (ref 6.5–8.1)

## 2021-06-11 LAB — CBC WITH DIFFERENTIAL (CANCER CENTER ONLY)
Abs Immature Granulocytes: 0.04 10*3/uL (ref 0.00–0.07)
Basophils Absolute: 0.1 10*3/uL (ref 0.0–0.1)
Basophils Relative: 1 %
Eosinophils Absolute: 0 10*3/uL (ref 0.0–0.5)
Eosinophils Relative: 0 %
HCT: 40.7 % (ref 39.0–52.0)
Hemoglobin: 13.5 g/dL (ref 13.0–17.0)
Immature Granulocytes: 0 %
Lymphocytes Relative: 14 %
Lymphs Abs: 1.6 10*3/uL (ref 0.7–4.0)
MCH: 29.7 pg (ref 26.0–34.0)
MCHC: 33.2 g/dL (ref 30.0–36.0)
MCV: 89.6 fL (ref 80.0–100.0)
Monocytes Absolute: 0.7 10*3/uL (ref 0.1–1.0)
Monocytes Relative: 6 %
Neutro Abs: 9.6 10*3/uL — ABNORMAL HIGH (ref 1.7–7.7)
Neutrophils Relative %: 79 %
Platelet Count: 229 10*3/uL (ref 150–400)
RBC: 4.54 MIL/uL (ref 4.22–5.81)
RDW: 16.9 % — ABNORMAL HIGH (ref 11.5–15.5)
WBC Count: 12 10*3/uL — ABNORMAL HIGH (ref 4.0–10.5)
nRBC: 0 % (ref 0.0–0.2)

## 2021-06-11 MED ORDER — SODIUM CHLORIDE 0.9 % IV SOLN
10.0000 mg | Freq: Once | INTRAVENOUS | Status: AC
  Administered 2021-06-11: 10 mg via INTRAVENOUS
  Filled 2021-06-11: qty 10

## 2021-06-11 MED ORDER — ACETAMINOPHEN 325 MG PO TABS
650.0000 mg | ORAL_TABLET | Freq: Once | ORAL | Status: AC
  Administered 2021-06-11: 650 mg via ORAL
  Filled 2021-06-11: qty 2

## 2021-06-11 MED ORDER — SODIUM CHLORIDE 0.9 % IV SOLN
60.0000 mg/m2 | Freq: Once | INTRAVENOUS | Status: AC
  Administered 2021-06-11: 120 mg via INTRAVENOUS
  Filled 2021-06-11: qty 12

## 2021-06-11 MED ORDER — DIPHENHYDRAMINE HCL 50 MG/ML IJ SOLN
50.0000 mg | Freq: Once | INTRAMUSCULAR | Status: AC
  Administered 2021-06-11: 50 mg via INTRAVENOUS
  Filled 2021-06-11: qty 1

## 2021-06-11 MED ORDER — SODIUM CHLORIDE 0.9 % IV SOLN: Freq: Once | INTRAVENOUS | Status: AC

## 2021-06-11 MED ORDER — SODIUM CHLORIDE 0.9 % IV SOLN
10.0000 mg/kg | Freq: Once | INTRAVENOUS | Status: AC
  Administered 2021-06-11: 800 mg via INTRAVENOUS
  Filled 2021-06-11: qty 50

## 2021-06-11 NOTE — Patient Instructions (Signed)
[facility]   °Discharge Instructions: °Thank you for choosing [facility] to provide your oncology and hematology care.  ° °If you have a lab appointment with the Cancer Center, please go directly to the Cancer Center and check in at the registration area. °  °Wear comfortable clothing and clothing appropriate for easy access to any Portacath or PICC line.  ° °We strive to give you quality time with your provider. You may need to reschedule your appointment if you arrive late (15 or more minutes).  Arriving late affects you and other patients whose appointments are after yours.  Also, if you miss three or more appointments without notifying the office, you may be dismissed from the clinic at the provider’s discretion.    °  °For prescription refill requests, have your pharmacy contact our office and allow 72 hours for refills to be completed.   ° °Today you received the following chemotherapy and/or immunotherapy agents: Ramucirumab (Cyramza) and Docetaxel (Taxotere)    °  °To help prevent nausea and vomiting after your treatment, we encourage you to take your nausea medication as directed. ° °BELOW ARE SYMPTOMS THAT SHOULD BE REPORTED IMMEDIATELY: °*FEVER GREATER THAN 100.4 F (38 °C) OR HIGHER °*CHILLS OR SWEATING °*NAUSEA AND VOMITING THAT IS NOT CONTROLLED WITH YOUR NAUSEA MEDICATION °*UNUSUAL SHORTNESS OF BREATH °*UNUSUAL BRUISING OR BLEEDING °*URINARY PROBLEMS (pain or burning when urinating, or frequent urination) °*BOWEL PROBLEMS (unusual diarrhea, constipation, pain near the anus) °TENDERNESS IN MOUTH AND THROAT WITH OR WITHOUT PRESENCE OF ULCERS (sore throat, sores in mouth, or a toothache) °UNUSUAL RASH, SWELLING OR PAIN  °UNUSUAL VAGINAL DISCHARGE OR ITCHING  ° °Items with * indicate a potential emergency and should be followed up as soon as possible or go to the Emergency Department if any problems should occur. ° °Please show the CHEMOTHERAPY ALERT CARD or  IMMUNOTHERAPY ALERT CARD at check-in to the Emergency Department and triage nurse. ° °Should you have questions after your visit or need to cancel or reschedule your appointment, please contact [facility]  Dept: [phone]  and follow the prompts.  Office hours are 8:00 a.m. to 4:30 p.m. Monday - Friday. Please note that voicemails left after 4:00 p.m. may not be returned until the following business day.  We are closed weekends and major holidays. You have access to a nurse at all times for urgent questions. Please call the main number to the clinic Dept: [phone] and follow the prompts. ° ° °For any non-urgent questions, you may also contact your provider using MyChart. We now offer e-Visits for anyone 18 and older to request care online for non-urgent symptoms. For details visit [email or web address]. °  °Also download the MyChart app! Go to the app store, search "MyChart", open the app, select , and log in with your MyChart username and password. ° °Due to Covid, a mask is required upon entering the hospital/clinic. If you do not have a mask, one will be given to you upon arrival. For doctor visits, patients may have 1 support person aged 18 or older with them. For treatment visits, patients cannot have anyone with them due to current Covid guidelines and our immunocompromised population.  ° °

## 2021-06-11 NOTE — Progress Notes (Signed)
Hematology and Oncology Follow Up Visit  Clifford [name] [id number] 1945/07/05 76 y.o. 06/11/2021 8:14 AM   Principle Diagnosis: 76 year old man with stage IV lung cancer diagnosed in 2022.  He was found to have PD-L1 score of 0 and pulmonary nodules and adenosquamous histology.   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  Prior Therapy: Status post pleurodesis and biopsy of the pleural-based nodule. Received 6 cycles of CVP with rituximab. Therapy concluded in December 2007 after achieving remission at the time. 3.     Definitive therapy with radiation and weekly chemotherapy utilizing carboplatin and paclitaxel started on August 20, 2020.  He completed 6 cycles of therapy on September 24, 2020. 4.   Nivolumab 480 mg monthly started in June 2022.  He completed 3 cycles of therapy in August 2022 with progression of disease. 5.   Gemcitabine 1000 mg per metered square with Navelbine 25 mg per metered square day 1, day 8.  Started on February 26, 2021.  He completed 3 cycles of therapy.  Therapy discontinued because of progression of disease.  Current therapy: Taxotere 75 mg per metered square with Cyramza 10 mg/kg started on April 30, 2021.  He is here for cycle 3 of therapy.  Interim History:  Clifford [name] is here for a follow-up visit.  Since the last visit, he reports continuous overall health decline and failure to thrive.  He lost more weight since the last visit.  He denies any nausea or vomiting or abdominal pain.  He reports his respiratory status is stable.  He denies any hospitalizations or illnesses.  He denies any fevers or chills or sweats.  He did report some tenderness in his fingers which improved at this time.    Medications: Reviewed without changes. Current Outpatient Medications  Medication Sig Dispense Refill   albuterol (VENTOLIN HFA) 108 (90 Base) MCG/ACT inhaler Inhale 2 puffs into the lungs every 6 (six)  hours as needed for wheezing or shortness of breath. 8 g 6   amLODipine (NORVASC) 5 MG tablet Take 5 mg by mouth daily.      aspirin 81 MG tablet Take 81 mg by mouth daily.     atorvastatin (LIPITOR) 10 MG tablet Take 10 mg by mouth daily.     cetirizine (ZYRTEC) 10 MG tablet Take 10 mg by mouth daily as needed for allergies.     fluticasone (FLONASE) 50 MCG/ACT nasal spray Place 2 sprays into both nostrils daily. 16 g 2   gabapentin (NEURONTIN) 300 MG capsule Take 1 capsule (300 mg total) by mouth 2 (two) times daily. (Patient not taking: Reported on 05/22/2021) 60 capsule 3   hydrochlorothiazide (HYDRODIURIL) 25 MG tablet Take 25 mg by mouth daily.     HYDROcodone bit-homatropine (HYCODAN) 5-1.5 MG/5ML syrup Take 5 mLs by mouth every 6 (six) hours as needed for cough. 473 mL 0   megestrol (MEGACE) 400 MG/10ML suspension Take 10 mLs (400 mg total) by mouth daily. 240 mL 1   ondansetron (ZOFRAN) 8 MG tablet Take 1 tablet (8 mg total) by mouth every 8 (eight) hours as needed for nausea or vomiting. 20 tablet 1   prochlorperazine (COMPAZINE) 10 MG tablet Take 1 tablet (10 mg total) by mouth every 6 (six) hours as needed for nausea or vomiting. 30 tablet 0   sucralfate (CARAFATE) 1 g tablet Take 1 tablet (1 g total) by mouth 4 (four) times daily. Dissolve each tablet in 15 cc water before  use. (Patient not taking: Reported on 05/22/2021) 120 tablet 1   Tiotropium Bromide Monohydrate (SPIRIVA RESPIMAT) 1.25 MCG/ACT AERS Inhale 2 puffs into the lungs daily. 4 g 0   traMADol (ULTRAM) 50 MG tablet Take 50 mg by mouth daily as needed for pain.     No current facility-administered medications for this visit.    Allergies: No Known Allergies     Physical Exam:       Blood pressure 114/88, pulse (!) 136, temperature 98.1 F (36.7 C), temperature source Temporal, resp. rate 19, weight 160 lb 8 oz (72.8 kg), SpO2 93 %.       ECOG: 2   General appearance: Comfortable appearing without any  discomfort Head: Normocephalic without any trauma Oropharynx: Mucous membranes are moist and pink without any thrush or ulcers. Eyes: Pupils are equal and round reactive to light. Lymph nodes: No cervical, supraclavicular, inguinal or axillary lymphadenopathy.   Heart:regular rate and rhythm.  S1 and S2 without leg edema. Lung: Clear without any rhonchi or wheezes.  No dullness to percussion. Abdomin: Soft, nontender, nondistended with good bowel sounds.  No hepatosplenomegaly. Musculoskeletal: No joint deformity or effusion.  Full range of motion noted. Neurological: No deficits noted on motor, sensory and deep tendon reflex exam. Skin: No petechial rash or dryness.  Appeared moist.        Lab Results: Lab Results  Component Value Date   WBC 8.3 05/21/2021   HGB 12.9 (L) 05/21/2021   HCT 40.7 05/21/2021   MCV 93.6 05/21/2021   PLT 266 05/21/2021     Chemistry      Component Value Date/Time   NA 139 05/21/2021 0803   NA 144 10/21/2016 0808   K 3.9 05/21/2021 0803   K 4.0 10/21/2016 0808   CL 104 05/21/2021 0803   CL 109 (H) 10/26/2012 0841   CO2 26 05/21/2021 0803   CO2 29 10/21/2016 0808   BUN 10 05/21/2021 0803   BUN 15.3 10/21/2016 0808   CREATININE 0.75 05/21/2021 0803   CREATININE 1.0 10/21/2016 0808      Component Value Date/Time   CALCIUM 9.0 05/21/2021 0803   CALCIUM 9.3 10/21/2016 0808   ALKPHOS 103 05/21/2021 0803   ALKPHOS 53 10/21/2016 0808   AST 21 05/21/2021 0803   AST 27 10/21/2016 0808   ALT 18 05/21/2021 0803   ALT 37 10/21/2016 0808   BILITOT 0.8 05/21/2021 0803   BILITOT 1.09 10/21/2016 0808          Impression and Plan:  76 year old with:   1.  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     He remains on Taxotere with Cyramza without any major complications.  Risks and benefits of continuing this treatment were reviewed at this time.  Potential complications that include nausea, vomiting and neuropathy were  reiterated.  Alternative options will be transitioning into supportive care and hospice mode.  He is still interested in proceeding aggressive measures and the plan is to proceed with treatment today at a reduced dose of Taxotere given the side effects he is experiencing.  We will update his staging scan before the next visit.   2.  Weight loss: Related to malignancy and chemotherapy.  We have discussed strategies to continue to improve his intake.  3.  IV access: No issues reported with peripheral veins at this time.  4.  Antiemetics: No nausea or vomiting reported at this time.  Compazine is available to him.  5.  Cough  and pulmonary considerations: Improved at this time with thoracentesis.  We will continue to monitor and repeat as needed.  I appreciate pulmonary medicine help in this matter.   6.  Goals of care and prognosis: Therapy remains palliative although aggressive measures are warranted given his reasonable performance status.  7.  Neutropenia: He is at risk of neutropenia and possible sepsis.  He will receive growth factor support after each cycle.  8.  Follow-up: He will return in 3 weeks for the next cycle of therapy.   30  minutes were spent on this encounter.  The time was dedicated to reviewing laboratory data, disease status update, addressing complications related to cancer and cancer therapy.  Zola Button, MD 1/4/20238:14 AM

## 2021-06-11 NOTE — Progress Notes (Signed)
Per Dr. Alen Blew, okay for patient to proceed with treatment today without urine protein resulted and with elevated heart rate 136.

## 2021-06-13 ENCOUNTER — Other Ambulatory Visit: Payer: Self-pay

## 2021-06-13 ENCOUNTER — Inpatient Hospital Stay: Payer: Medicare HMO

## 2021-06-13 VITALS — BP 125/65 | HR 115 | Temp 98.1°F | Resp 20

## 2021-06-13 DIAGNOSIS — C3431 Malignant neoplasm of lower lobe, right bronchus or lung: Secondary | ICD-10-CM

## 2021-06-13 DIAGNOSIS — Z79899 Other long term (current) drug therapy: Secondary | ICD-10-CM | POA: Diagnosis not present

## 2021-06-13 DIAGNOSIS — Z5112 Encounter for antineoplastic immunotherapy: Secondary | ICD-10-CM | POA: Diagnosis not present

## 2021-06-13 DIAGNOSIS — Z5189 Encounter for other specified aftercare: Secondary | ICD-10-CM | POA: Diagnosis not present

## 2021-06-13 DIAGNOSIS — Z5111 Encounter for antineoplastic chemotherapy: Secondary | ICD-10-CM | POA: Diagnosis not present

## 2021-06-13 MED ORDER — PEGFILGRASTIM-CBQV 6 MG/0.6ML ~~LOC~~ SOSY
6.0000 mg | PREFILLED_SYRINGE | Freq: Once | SUBCUTANEOUS | Status: AC
  Administered 2021-06-13: 6 mg via SUBCUTANEOUS
  Filled 2021-06-13: qty 0.6

## 2021-06-19 ENCOUNTER — Telehealth: Payer: Self-pay | Admitting: *Deleted

## 2021-06-19 NOTE — Telephone Encounter (Signed)
Returned PC to patient, he reports having significant cough for the past 2 days, has been taking hydrocodone syrup & robitussin without relief. He denies fever.  Patient has not done home covid test, has not been seen by provider. Advised patient to be seen by PCP or go to Urgent Care as soon as possible.  He verbalizes understanding.

## 2021-06-23 ENCOUNTER — Ambulatory Visit (INDEPENDENT_AMBULATORY_CARE_PROVIDER_SITE_OTHER): Payer: Medicare HMO

## 2021-06-23 ENCOUNTER — Telehealth: Payer: Self-pay

## 2021-06-23 ENCOUNTER — Ambulatory Visit: Payer: Medicare HMO | Admitting: Acute Care

## 2021-06-23 ENCOUNTER — Encounter: Payer: Self-pay | Admitting: Acute Care

## 2021-06-23 ENCOUNTER — Other Ambulatory Visit: Payer: Self-pay

## 2021-06-23 VITALS — BP 128/68 | HR 120 | Temp 97.6°F | Ht 71.0 in | Wt 159.2 lb

## 2021-06-23 DIAGNOSIS — C801 Malignant (primary) neoplasm, unspecified: Secondary | ICD-10-CM

## 2021-06-23 DIAGNOSIS — R0602 Shortness of breath: Secondary | ICD-10-CM

## 2021-06-23 DIAGNOSIS — C3490 Malignant neoplasm of unspecified part of unspecified bronchus or lung: Secondary | ICD-10-CM | POA: Diagnosis not present

## 2021-06-23 DIAGNOSIS — R911 Solitary pulmonary nodule: Secondary | ICD-10-CM | POA: Diagnosis not present

## 2021-06-23 DIAGNOSIS — J90 Pleural effusion, not elsewhere classified: Secondary | ICD-10-CM | POA: Diagnosis not present

## 2021-06-23 MED ORDER — SPIRIVA RESPIMAT 1.25 MCG/ACT IN AERS: 2.0000 | INHALATION_SPRAY | Freq: Every day | RESPIRATORY_TRACT | 0 refills | Status: AC

## 2021-06-23 NOTE — Telephone Encounter (Signed)
Pt advised with understanding. 

## 2021-06-23 NOTE — Telephone Encounter (Signed)
Pt is currently at [facility] for evaluation of his increased SOB.  He had a thoracentesis in the past that helped and wanted to know if we wanted to schedule another one or let [place].  Please advise

## 2021-06-23 NOTE — Patient Instructions (Addendum)
Stat CXR now. We will call you with the results.  We will evaluate if he needs thoracentesis again today.  You have not had blood thinners.  Continue Spiriva and Albuterol as you have been doing.  Rinse mouth after use  We will check for Spiriva samples.  Plan for Thoracentesis 1/17 ( Tomorrow ) at 4:15 pm with Dr. Loanne Drilling  Hold baby ASA tomorrow morning You need to try taking Compazine and keeping fluids down. If you cannot keep fluids down today , you need to go to the ED for IV fluids and IV nausea medication.  If you get dehydrated, you risk your kidney function and the ability to get your chemo.  If by 6 pm tonight you have not been able to re-hydrate, you need to seek care at the ED.  If you can keep 12 ounces of fluids down every 2 hours until 6 pm tonight, you are fine to wait until tomorrow, and have the procedure as an outpatient, here in the office If you continue to vomit and cannot keep fluids down by 6 pm tonight, you will need to go to the ED for IVF, and they will be able to do the thoracentesis there. Follow up in 2 weeks with CXr with Dr. Loanne Drilling or Dr. Lamonte Sakai after thoracentesis.    Please contact office for sooner follow up if symptoms do not improve or worsen or seek emergency care
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
## 2021-06-24 ENCOUNTER — Ambulatory Visit: Payer: Medicare HMO | Admitting: Pulmonary Disease

## 2021-06-24 ENCOUNTER — Ambulatory Visit (INDEPENDENT_AMBULATORY_CARE_PROVIDER_SITE_OTHER): Payer: Medicare HMO

## 2021-06-24 ENCOUNTER — Other Ambulatory Visit (HOSPITAL_COMMUNITY)
Admission: RE | Admit: 2021-06-24 | Discharge: 2021-06-24 | Disposition: A | Discharge status: Home / Self Care | Payer: Medicare HMO | Source: Ambulatory Visit | Attending: Pulmonary Disease | Admitting: Pulmonary Disease

## 2021-06-24 ENCOUNTER — Encounter: Payer: Self-pay | Admitting: Pulmonary Disease

## 2021-06-24 VITALS — BP 110/58 | HR 123 | Temp 97.5°F | Ht 71.0 in | Wt 157.8 lb

## 2021-06-24 DIAGNOSIS — J910 Malignant pleural effusion: Secondary | ICD-10-CM | POA: Diagnosis not present

## 2021-06-24 DIAGNOSIS — R0602 Shortness of breath: Secondary | ICD-10-CM

## 2021-06-24 DIAGNOSIS — C801 Malignant (primary) neoplasm, unspecified: Secondary | ICD-10-CM | POA: Diagnosis not present

## 2021-06-24 DIAGNOSIS — J90 Pleural effusion, not elsewhere classified: Secondary | ICD-10-CM

## 2021-06-24 DIAGNOSIS — C3431 Malignant neoplasm of lower lobe, right bronchus or lung: Secondary | ICD-10-CM | POA: Diagnosis not present

## 2021-06-24 DIAGNOSIS — C3490 Malignant neoplasm of unspecified part of unspecified bronchus or lung: Secondary | ICD-10-CM | POA: Diagnosis not present

## 2021-06-24 DIAGNOSIS — C384 Malignant neoplasm of pleura: Secondary | ICD-10-CM | POA: Diagnosis not present

## 2021-06-24 NOTE — Patient Instructions (Addendum)
Presumed right malignant effusion S/p thoracentesis with 1.7L removed Follow-up labs  Please call office for symptoms of worsening shortness of breath
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
## 2021-06-25 ENCOUNTER — Telehealth: Payer: Self-pay | Admitting: Acute Care

## 2021-06-25 ENCOUNTER — Telehealth: Payer: Self-pay | Admitting: *Deleted

## 2021-06-25 ENCOUNTER — Ambulatory Visit (HOSPITAL_COMMUNITY): Payer: Medicare HMO

## 2021-06-25 DIAGNOSIS — C3431 Malignant neoplasm of lower lobe, right bronchus or lung: Secondary | ICD-10-CM

## 2021-06-25 DIAGNOSIS — C3492 Malignant neoplasm of unspecified part of left bronchus or lung: Secondary | ICD-10-CM

## 2021-06-25 LAB — BODY FLUID CELL COUNT WITH DIFFERENTIAL
Lymphs, Fluid: 1 %
Monocyte-Macrophage-Serous Fluid: 8 % — ABNORMAL LOW (ref 50–90)
Neutrophil Count, Fluid: 4 % (ref 0–25)
Other Cells, Fluid: 85 %
Total Nucleated Cell Count, Fluid: 214 cu mm (ref 0–1000)

## 2021-06-25 LAB — PROTEIN, BODY FLUID (OTHER): Protein, Fluid: 3.7 g/dL

## 2021-06-25 LAB — LACTATE DEHYDROGENASE, PLEURAL OR PERITONEAL FLUID: LD, Fluid: 719 U/L — ABNORMAL HIGH (ref 3–23)

## 2021-06-25 NOTE — Addendum Note (Signed)
Addended by: Monna Fam L on: 06/25/2021 12:18 PM   Modules accepted: Orders

## 2021-06-25 NOTE — Telephone Encounter (Signed)
Please call patient and ask how he is doing after his thoracentesis yesterday. Please schedule patient with me  for 1/23 OV with CXR prior. He had a procedure done yesterday in the office ( Thoracentesis), and Dr. Loanne Drilling and I would like a CXR Monday and an OV so we can go over his CT results. I have openings Monday Morning. Thanks so much

## 2021-06-25 NOTE — Telephone Encounter (Signed)
Called and spoke with patient to get him scheduled for Monday with Judson Roch. Patient has been rescheduled for his CT it was supposed to be today but patient canceled due to not feeling good and it is not on 06/30/21 at 4 pm. Patient has been scheduled with Sarah for Monday 1/23 at 10:30. Nothing further needed at this time.

## 2021-06-25 NOTE — Telephone Encounter (Signed)
Patient called office- said he decided not to have CT today.He called [facility] and cancelled it. He stated he is very tired and nauseous and did not know if he could drink the contrast. He asked for this writer to reschedule CT.  Contacted Central Scheduling on patient's behalf. Scheduler (Peggy) r/s CT for [facility] 06/30/21 at 4:30 PM @ [facility] [address]  Patient instructions: NPO for 4 hours prior to CT. Drink 1st contrast 2:30 pm and 2nd bottle 3:30 pm. Arrive at [facility] at 4pm to check in.  Contacted patient with this information. Patient verbalized understanding of all info -states he has oral contrast to drink.  Dr. Alen Blew updated.

## 2021-06-25 NOTE — Telephone Encounter (Signed)
I have called and spoken with the patient. He states he feels better after the thoracentesis 1/17. He has an appointment to be seen with a  follow up CXR 06/30/2021. I have advised him to call the office if he gets worse, or to seek emergency care if he cannot breath well, and his oxygen saturations drop below 88% sustained. He verbalized understanding of the above and had no further questions at completion of the call.

## 2021-06-26 LAB — CYTOLOGY - NON PAP

## 2021-06-26 LAB — ALBUMIN, FLUID (OTHER): Albumin, Body Fluid Other: 1.9 g/dL

## 2021-06-28 LAB — CHOLESTEROL, BODY FLUID: Cholesterol, Fluid: 66 mg/dL

## 2021-06-29 LAB — GRAM STAIN
GRAM STAIN:: NONE SEEN
MICRO NUMBER:: 12880748
SPECIMEN QUALITY:: ADEQUATE

## 2021-06-29 LAB — BODY FLUID CULTURE

## 2021-06-29 LAB — GLUCOSE, PLEURAL OR PERITONEAL FLUID: Glucose, Pleural fluid: 40 mg/dL

## 2021-06-30 ENCOUNTER — Telehealth: Payer: Self-pay | Admitting: Acute Care

## 2021-06-30 ENCOUNTER — Encounter: Payer: Self-pay | Admitting: Pulmonary Disease

## 2021-06-30 ENCOUNTER — Ambulatory Visit (INDEPENDENT_AMBULATORY_CARE_PROVIDER_SITE_OTHER): Payer: Medicare HMO

## 2021-06-30 ENCOUNTER — Encounter: Payer: Self-pay | Admitting: Acute Care

## 2021-06-30 ENCOUNTER — Encounter (HOSPITAL_BASED_OUTPATIENT_CLINIC_OR_DEPARTMENT_OTHER): Payer: Self-pay

## 2021-06-30 ENCOUNTER — Ambulatory Visit: Payer: Medicare HMO | Admitting: Acute Care

## 2021-06-30 ENCOUNTER — Other Ambulatory Visit: Payer: Self-pay

## 2021-06-30 ENCOUNTER — Ambulatory Visit (HOSPITAL_BASED_OUTPATIENT_CLINIC_OR_DEPARTMENT_OTHER)
Admission: RE | Admit: 2021-06-30 | Discharge: 2021-06-30 | Disposition: A | Discharge status: Home / Self Care | Payer: Medicare HMO | Source: Ambulatory Visit | Attending: Oncology | Admitting: Oncology

## 2021-06-30 VITALS — BP 122/68 | HR 120 | Temp 97.5°F | Ht 71.0 in | Wt 157.8 lb

## 2021-06-30 DIAGNOSIS — C801 Malignant (primary) neoplasm, unspecified: Secondary | ICD-10-CM | POA: Diagnosis not present

## 2021-06-30 DIAGNOSIS — C3431 Malignant neoplasm of lower lobe, right bronchus or lung: Secondary | ICD-10-CM | POA: Diagnosis not present

## 2021-06-30 DIAGNOSIS — N281 Cyst of kidney, acquired: Secondary | ICD-10-CM | POA: Diagnosis not present

## 2021-06-30 DIAGNOSIS — Z9889 Other specified postprocedural states: Secondary | ICD-10-CM | POA: Diagnosis not present

## 2021-06-30 DIAGNOSIS — C3490 Malignant neoplasm of unspecified part of unspecified bronchus or lung: Secondary | ICD-10-CM | POA: Diagnosis not present

## 2021-06-30 DIAGNOSIS — C3492 Malignant neoplasm of unspecified part of left bronchus or lung: Secondary | ICD-10-CM

## 2021-06-30 DIAGNOSIS — R918 Other nonspecific abnormal finding of lung field: Secondary | ICD-10-CM | POA: Diagnosis not present

## 2021-06-30 DIAGNOSIS — J90 Pleural effusion, not elsewhere classified: Secondary | ICD-10-CM

## 2021-06-30 DIAGNOSIS — R112 Nausea with vomiting, unspecified: Secondary | ICD-10-CM

## 2021-06-30 MED ORDER — IOHEXOL 300 MG/ML  SOLN
85.0000 mL | Freq: Once | INTRAMUSCULAR | Status: AC | PRN
  Administered 2021-06-30: 85 mL via INTRAVENOUS

## 2021-06-30 NOTE — Telephone Encounter (Signed)
This patient needs to be set up for Pleurex Tube at [facility] next week with Dr. Loanne Drilling.

## 2021-06-30 NOTE — Telephone Encounter (Signed)
Called and spoke with ENDO patient has been scheduled for Monday 07/07/2021 10:30 am for Pleurx placement. Advised patient to arrive at 10:00 am and to hold his Aspirin 2 days prior to procedure. Patient expressed understanding. Will route to Dr. Loanne Drilling as Juluis Rainier. Nothing further needed at this time.
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
## 2021-06-30 NOTE — Patient Instructions (Addendum)
It is good to see you today. We will schedule a Thoracentesis for 1/24/ at 10 am with Dr. Loanne Drilling. We will schedule you for a Pleurex catheter insertion next week at [facility].  This will be with Dr. Loanne Drilling. You will get a call to get this scheduled.  Hold aspirin tomorrow am.  Your CXR shows some re-accumulation  Watch the YouTube videos on Pleurex Catheter drainage instructions . This will help you anticipate the process.  Follow up tomorrow as above. Follow up after Pleurex placement.  If you develop worsening shortness of breath before the above procedures, please seek emergency care.

## 2021-06-30 NOTE — Progress Notes (Signed)
Procedure:   Thoracentesis  Procedure Note  Date:06/24/21 Time:16:00  Provider Performing:Jezebelle Ledwell Rodman Pickle, MD  Procedure: Thoracentesis with imaging guidance [phone])  Indication(s) Pleural Effusion  Consent Risks of the procedure as well as the alternatives and risks of each were explained to the patient and/or caregiver.  Consent for the procedure was obtained and is signed in the bedside chart  Anesthesia Topical only with 1% lidocaine    Time Out Verified patient identification, verified procedure, site/side was marked, verified correct patient position, special equipment/implants available, medications/allergies/relevant history reviewed, required imaging and test results available.   Sterile Technique Maximal sterile technique including full sterile barrier drape, hand hygiene, sterile gown, sterile gloves, mask, hair covering, sterile ultrasound probe cover (if used).  Procedure Description Ultrasound was used to identify appropriate pleural anatomy for placement and overlying skin marked.  Area of drainage cleaned and draped in sterile fashion. Lidocaine was used to anesthetize the skin and subcutaneous tissue.  1700 cc's of maroon appearing fluid was drained from the right pleural space. Catheter then removed and bandaid applied to site.   Complications/Tolerance None; patient tolerated the procedure well. Chest X-ray is ordered to confirm no post-procedural complication.   EBL Minimal   Specimen(s) Pleural fluid

## 2021-07-01 ENCOUNTER — Encounter: Payer: Self-pay | Admitting: Pulmonary Disease

## 2021-07-01 ENCOUNTER — Ambulatory Visit: Payer: Medicare HMO | Admitting: Pulmonary Disease

## 2021-07-01 ENCOUNTER — Ambulatory Visit (INDEPENDENT_AMBULATORY_CARE_PROVIDER_SITE_OTHER): Payer: Medicare HMO

## 2021-07-01 VITALS — BP 102/58 | HR 127 | Temp 97.5°F

## 2021-07-01 DIAGNOSIS — J910 Malignant pleural effusion: Secondary | ICD-10-CM | POA: Diagnosis not present

## 2021-07-01 DIAGNOSIS — J90 Pleural effusion, not elsewhere classified: Secondary | ICD-10-CM | POA: Diagnosis not present

## 2021-07-01 NOTE — Patient Instructions (Addendum)
Recurrent right malignant effusion S/p thoracentesis with 1.2L removed. See separate procedure note.  Post-procedure CXR reviewed with no discernible PTX  Scheduled for PleurX catheter on 07/07/21. Please arrive at [facility] at 2 pm for procedure at 2:30 pm. You will go in the [facility] main entrance and check in at Admissions.
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
## 2021-07-02 ENCOUNTER — Inpatient Hospital Stay: Payer: Medicare HMO

## 2021-07-02 ENCOUNTER — Telehealth: Payer: Self-pay | Admitting: *Deleted

## 2021-07-02 ENCOUNTER — Inpatient Hospital Stay: Payer: Medicare HMO | Admitting: Dietician

## 2021-07-02 ENCOUNTER — Inpatient Hospital Stay: Payer: Medicare HMO | Admitting: Oncology

## 2021-07-02 NOTE — Telephone Encounter (Signed)
Pt called to cancel all appt's for today. Pt stated that he's not feeling well. Temp is running from 99.5-99.7. Had diarrhea since 11pm last night, SOB when up and moving around but O2 level is at 94%. He states 1.2 Liters of fluid was pulled off lungs yesterday. He's drinking some but not eating much. Taking a covid test today. Also advised to take covid test 2 days later after the first negative one if he's still having symptoms. Educated pt on taking tylenol for fever and joint pain, as well as continue to increase fluids in order to not become dehydrated. Advised to call when he's feeling better so that he can be rescheduled. Pt verbalized understanding.

## 2021-07-03 ENCOUNTER — Telehealth: Payer: Self-pay | Admitting: *Deleted

## 2021-07-03 NOTE — Telephone Encounter (Signed)
Pt left a message stating he is feeling much better. Covid was negative, drinking a lot and diarrhea is almost stopped. Message sent to the scheduler to reschedule all missed appts

## 2021-07-04 ENCOUNTER — Inpatient Hospital Stay: Payer: Medicare HMO

## 2021-07-06 ENCOUNTER — Encounter: Payer: Self-pay | Admitting: Pulmonary Disease

## 2021-07-06 DIAGNOSIS — J910 Malignant pleural effusion: Secondary | ICD-10-CM | POA: Insufficient documentation

## 2021-07-06 NOTE — Progress Notes (Signed)
Procedure:   Thoracentesis  Procedure Note  Date:07/01/21 Time:10:30 AM  Provider Performing:Lenee Franze Rodman Pickle, MD  Procedure: Thoracentesis with imaging guidance [phone])  Indication(s) Pleural Effusion  Consent Risks of the procedure as well as the alternatives and risks of each were explained to the patient and/or caregiver.  Consent for the procedure was obtained and is signed in the bedside chart  Anesthesia Topical only with 1% lidocaine    Time Out Verified patient identification, verified procedure, site/side was marked, verified correct patient position, special equipment/implants available, medications/allergies/relevant history reviewed, required imaging and test results available.   Sterile Technique Maximal sterile technique including full sterile barrier drape, hand hygiene, sterile gown, sterile gloves, mask, hair covering, sterile ultrasound probe cover (if used).  Procedure Description Ultrasound was used to identify appropriate pleural anatomy for placement and overlying skin marked.  Area of drainage cleaned and draped in sterile fashion. Lidocaine was used to anesthetize the skin and subcutaneous tissue.  1200 cc's of dark sanguinous appearing fluid was drained from the right pleural space. Catheter then removed and bandaid applied to site.    Complications/Tolerance None; patient tolerated the procedure well. Chest X-ray is ordered to confirm no post-procedural complication.   EBL Minimal   Specimen(s) None

## 2021-07-07 ENCOUNTER — Ambulatory Visit (HOSPITAL_COMMUNITY): Payer: Medicare HMO

## 2021-07-07 ENCOUNTER — Encounter (HOSPITAL_COMMUNITY): Payer: Self-pay | Admitting: Pulmonary Disease

## 2021-07-07 ENCOUNTER — Encounter (HOSPITAL_COMMUNITY)
Admission: RE | Disposition: A | Discharge status: Home / Self Care | Payer: Self-pay | Source: Ambulatory Visit | Attending: Pulmonary Disease

## 2021-07-07 ENCOUNTER — Ambulatory Visit: Payer: Medicare HMO | Admitting: Acute Care

## 2021-07-07 ENCOUNTER — Ambulatory Visit (HOSPITAL_COMMUNITY)
Admission: RE | Admit: 2021-07-07 | Discharge: 2021-07-07 | Disposition: A | Discharge status: Home / Self Care | Payer: Medicare HMO | Source: Ambulatory Visit | Attending: Pulmonary Disease | Admitting: Pulmonary Disease

## 2021-07-07 DIAGNOSIS — Z87891 Personal history of nicotine dependence: Secondary | ICD-10-CM | POA: Insufficient documentation

## 2021-07-07 DIAGNOSIS — C3491 Malignant neoplasm of unspecified part of right bronchus or lung: Secondary | ICD-10-CM | POA: Insufficient documentation

## 2021-07-07 DIAGNOSIS — Z9889 Other specified postprocedural states: Secondary | ICD-10-CM

## 2021-07-07 DIAGNOSIS — J939 Pneumothorax, unspecified: Secondary | ICD-10-CM | POA: Diagnosis not present

## 2021-07-07 DIAGNOSIS — J90 Pleural effusion, not elsewhere classified: Secondary | ICD-10-CM

## 2021-07-07 HISTORY — PX: CHEST TUBE INSERTION: SHX231

## 2021-07-07 SURGERY — INSERTION, PLEURAL DRAINAGE CATHETER
Anesthesia: LOCAL | Laterality: Right

## 2021-07-07 MED ORDER — LIDOCAINE HCL (PF) 1 % IJ SOLN
INTRAMUSCULAR | Status: AC
  Filled 2021-07-07: qty 30

## 2021-07-07 NOTE — Discharge Instructions (Addendum)
PleurX Home Management If draining >344ml, drain daily If draining 200-300 ml, drain every other day If draining 100-200 ml, drain every three days When you drain 50 ml three consecutive days in a row, notify your doctor so we can check a chest x-ray and possibly remove the catheter. Please keep a log and bring it with you to your clinic appointment in 10 days.

## 2021-07-07 NOTE — Interval H&P Note (Signed)
History and Physical Interval Note:  07/07/2021 2:28 PM  Clifford [name]  has presented today for surgery, with the diagnosis of PLEURAL EFFUSION.  The various methods of treatment have been discussed with the patient and family. After consideration of risks, benefits and other options for treatment, the patient has consented to  Procedure(s): INSERTION PLEURAL DRAINAGE CATHETER (Right) as a surgical intervention.  The patient's history has been reviewed, patient examined, no change in status, stable for surgery.  I have reviewed the patient's chart and labs.  Questions were answered to the patient's satisfaction.     Sereena Marando Rodman Pickle, MD

## 2021-07-07 NOTE — Op Note (Addendum)
PleurX Insertion Procedure Note  Clifford [name] [id number] 10/12/1945  Date:07/07/21  Time:4:05 PM   Provider Performing:Meleah Demeyer Rodman Pickle, MD  Procedure: PleurX Tunneled Pleural Catheter Placement (40981)  Indication(s) Relief of dyspnea from recurrent effusion  Consent Risks of the procedure as well as the alternatives and risks of each were explained to the patient and/or caregiver.  Consent for the procedure was obtained.   Anesthesia Topical only with 1% lidocaine    Time Out Verified patient identification, verified procedure, site/side was marked, verified correct patient position, special equipment/implants available, medications/allergies/relevant history reviewed, required imaging and test results available.   Sterile Technique Maximal sterile technique including sterile barrier drape, hand hygiene, sterile gown, sterile gloves, mask, hair covering.    Procedure Description Ultrasound used to identify appropriate pleural anatomy for placement and overlying skin marked.  Area of drainage cleaned and draped in sterile fashion.   Lidocaine was used to anesthetize the skin and subcutaneous tissue.   1.5 cm incision made overlying fluid and another about 5 cm anterior to this along chest wall.  PleurX catheter inserted in usual sterile fashion using modified seldinger technique.  Interrupted silk sutures placed at catheter insertion and tunneling points which will be removed at later date.  PleurX catheter then hooked to suction.  After fluid aspirated, pleurX capped and sterile dressing applied.   Complications/Tolerance None; patient tolerated the procedure well. Chest X-ray is ordered to confirm no post-procedural complication. Right pneumothorax ex-vacuo present. SpO2 97% and patient in no distress Safe for discharge  EBL Minimal   Specimen(s) none

## 2021-07-07 NOTE — Progress Notes (Signed)
Pt's daughter located a Financial controller in the hallway to inquire about Advance Directives and other documents. Secretary made referral to this Probation officer. Chaplain asked open ended questions to facilitate story telling and emotional expression. Pt's daughter shared that her dad has been diagnosed with stage four lung cancer and is here today to have a procedure to keep fluid off of his lungs. She acknowledged some fear and concern that his condition is terminal, but states that his doctor is hopeful that he will have a good quality of life for a while. She hopes to get some things in place so that she can be a good advocate for her father and to best position her mother. CHaplain provided education on Living Will and [place] and also offered reflective listening and support. Herb receives chemo at [facility] and should be seen tomorrow. Chaplain provided pt's daughter with information on additional support services available at that facility including contact information for Lorrin Jackson, chaplain.   Please page as further needs arise.  Donald Prose. Elyn Peers, M.Div. [facility] Chaplain Pager [phone] Office [phone]

## 2021-07-08 ENCOUNTER — Inpatient Hospital Stay: Payer: Medicare HMO

## 2021-07-08 ENCOUNTER — Telehealth: Payer: Self-pay | Admitting: Pulmonary Disease

## 2021-07-08 ENCOUNTER — Telehealth: Payer: Self-pay | Admitting: *Deleted

## 2021-07-08 ENCOUNTER — Inpatient Hospital Stay: Payer: Medicare HMO | Admitting: Dietician

## 2021-07-08 ENCOUNTER — Other Ambulatory Visit: Payer: Self-pay

## 2021-07-08 ENCOUNTER — Encounter: Payer: Medicare HMO | Admitting: Dietician

## 2021-07-08 ENCOUNTER — Inpatient Hospital Stay: Payer: Medicare HMO | Admitting: Oncology

## 2021-07-08 VITALS — BP 99/59 | HR 95

## 2021-07-08 VITALS — BP 107/68 | HR 115 | Temp 98.1°F | Resp 19 | Ht 71.0 in | Wt 146.5 lb

## 2021-07-08 DIAGNOSIS — C3492 Malignant neoplasm of unspecified part of left bronchus or lung: Secondary | ICD-10-CM

## 2021-07-08 DIAGNOSIS — C3431 Malignant neoplasm of lower lobe, right bronchus or lung: Secondary | ICD-10-CM

## 2021-07-08 DIAGNOSIS — Z5112 Encounter for antineoplastic immunotherapy: Secondary | ICD-10-CM | POA: Diagnosis not present

## 2021-07-08 DIAGNOSIS — Z79899 Other long term (current) drug therapy: Secondary | ICD-10-CM | POA: Diagnosis not present

## 2021-07-08 DIAGNOSIS — Z5189 Encounter for other specified aftercare: Secondary | ICD-10-CM | POA: Diagnosis not present

## 2021-07-08 DIAGNOSIS — Z5111 Encounter for antineoplastic chemotherapy: Secondary | ICD-10-CM | POA: Diagnosis not present

## 2021-07-08 LAB — CMP (CANCER CENTER ONLY)
ALT: 14 U/L (ref 0–44)
AST: 19 U/L (ref 15–41)
Albumin: 3.4 g/dL — ABNORMAL LOW (ref 3.5–5.0)
Alkaline Phosphatase: 88 U/L (ref 38–126)
Anion gap: 7 (ref 5–15)
BUN: 11 mg/dL (ref 8–23)
CO2: 29 mmol/L (ref 22–32)
Calcium: 9.2 mg/dL (ref 8.9–10.3)
Chloride: 98 mmol/L (ref 98–111)
Creatinine: 0.6 mg/dL — ABNORMAL LOW (ref 0.61–1.24)
GFR, Estimated: >60 mL/min (ref >60)
Glucose, Bld: 112 mg/dL — ABNORMAL HIGH (ref 70–99)
Potassium: 3.7 mmol/L (ref 3.5–5.1)
Sodium: 134 mmol/L — ABNORMAL LOW (ref 135–145)
Total Bilirubin: 1.1 mg/dL (ref 0.3–1.2)
Total Protein: 6.4 g/dL — ABNORMAL LOW (ref 6.5–8.1)

## 2021-07-08 LAB — CBC WITH DIFFERENTIAL (CANCER CENTER ONLY)
Abs Immature Granulocytes: 0.02 10*3/uL (ref 0.00–0.07)
Basophils Absolute: 0.1 10*3/uL (ref 0.0–0.1)
Basophils Relative: 1 %
Eosinophils Absolute: 0.2 10*3/uL (ref 0.0–0.5)
Eosinophils Relative: 2 %
HCT: 39.9 % (ref 39.0–52.0)
Hemoglobin: 12.9 g/dL — ABNORMAL LOW (ref 13.0–17.0)
Immature Granulocytes: 0 %
Lymphocytes Relative: 15 %
Lymphs Abs: 1.4 10*3/uL (ref 0.7–4.0)
MCH: 28.9 pg (ref 26.0–34.0)
MCHC: 32.3 g/dL (ref 30.0–36.0)
MCV: 89.5 fL (ref 80.0–100.0)
Monocytes Absolute: 0.4 10*3/uL (ref 0.1–1.0)
Monocytes Relative: 5 %
Neutro Abs: 6.9 10*3/uL (ref 1.7–7.7)
Neutrophils Relative %: 77 %
Platelet Count: 228 10*3/uL (ref 150–400)
RBC: 4.46 MIL/uL (ref 4.22–5.81)
RDW: 17.3 % — ABNORMAL HIGH (ref 11.5–15.5)
WBC Count: 8.9 10*3/uL (ref 4.0–10.5)
nRBC: 0 % (ref 0.0–0.2)

## 2021-07-08 MED ORDER — SODIUM CHLORIDE 0.9 % IV SOLN
10.0000 mg | Freq: Once | INTRAVENOUS | Status: AC
  Administered 2021-07-08: 10 mg via INTRAVENOUS
  Filled 2021-07-08: qty 10

## 2021-07-08 MED ORDER — SODIUM CHLORIDE 0.9 % IV SOLN: Freq: Once | INTRAVENOUS | Status: AC

## 2021-07-08 MED ORDER — ACETAMINOPHEN 325 MG PO TABS
650.0000 mg | ORAL_TABLET | Freq: Once | ORAL | Status: AC
  Administered 2021-07-08: 650 mg via ORAL
  Filled 2021-07-08: qty 2

## 2021-07-08 MED ORDER — SODIUM CHLORIDE 0.9 % IV SOLN
60.0000 mg/m2 | Freq: Once | INTRAVENOUS | Status: AC
  Administered 2021-07-08: 120 mg via INTRAVENOUS
  Filled 2021-07-08: qty 12

## 2021-07-08 MED ORDER — DIPHENHYDRAMINE HCL 50 MG/ML IJ SOLN
50.0000 mg | Freq: Once | INTRAMUSCULAR | Status: AC
  Administered 2021-07-08: 50 mg via INTRAVENOUS
  Filled 2021-07-08: qty 1

## 2021-07-08 MED ORDER — POLYETHYLENE GLYCOL 3350 17 G PO PACK: 17.0000 g | PACK | Freq: Every day | ORAL | 3 refills | Status: AC

## 2021-07-08 MED ORDER — SODIUM CHLORIDE 0.9 % IV SOLN
700.0000 mg | Freq: Once | INTRAVENOUS | Status: AC
  Administered 2021-07-08: 700 mg via INTRAVENOUS
  Filled 2021-07-08: qty 50

## 2021-07-08 MED ORDER — SODIUM CHLORIDE 0.9 % IV SOLN: Freq: Once | INTRAVENOUS | Status: DC

## 2021-07-08 NOTE — Telephone Encounter (Signed)
Error

## 2021-07-08 NOTE — Progress Notes (Signed)
Ok to treat without urine prot today per Dr Alen Blew

## 2021-07-08 NOTE — Telephone Encounter (Addendum)
Per Dr.[name], OK to treat without protein urine due to pt not being able to urinate much due to dehydration and prostate and HR 115

## 2021-07-08 NOTE — Progress Notes (Signed)
Nutrition Follow-up:  Patient with stage IV lung cancer. He is currently receiving Taxotere with Cyramza. S/p Pleurx on 1/30  Met with patient during infusion. He reports his chest is sore, did not sleep well last night due to pain s/p Pleurx placement yesterday. Patient reports feeling less short of breath today. His appetite remains poor. Patient continues to have altered taste to foods. He is doing baking soda salt water rinses before meals. Patient reports he is continuing to try foods everyday, but spitting them soon after putting them in his mouth. Patient recalls trying slice of cheese which tasted "normal" at first, but before he swallowed, it began to taste "horrible" and he spit out. He is tolerating chocolate Boost Plus, drinking 3/day. Patient is asking for additional coupons. Patient has been able to eat pudding and ice cream. He likes milkshakes from [employer]. Patient agreeable to try chocolate Ensure Complete during infusion today.   Medications: reviewed  Labs:  Na 134, Glucose 112  Anthropometrics: Weight 146 lb 8 oz today   1/23 - 157 lb 12.8 oz 1/16 - 159 lb 3.2 oz 12/14 - 164 lb 11.2 oz  Weights have decreased 11% (18 lbs) in 7 weeks; significant. He is s/p thoracentesis on 1/17 (-1.7 L) and 1/24 (-1.2 L)  NUTRITION DIAGNOSIS: Inadequate oral intake ongoing   INTERVENTION:  Encouraged high calorie, high protein foods  Suggested pt increase Boost Plus, recommend 4-5/day - out of Boost coupons, pt agreeable to Ensure coupons Continue high calorie shakes Continue taking appetite stimulant as prescribed per MD Reviewed strategies for altered taste -pt has handout Continue baking soda salt water rinses before meals    MONITORING, EVALUATION, GOAL: weight trends, intake    NEXT VISIT: To be scheduled with treatment

## 2021-07-08 NOTE — Telephone Encounter (Signed)
-----   Message from [place], MD sent at 07/07/2021  3:50 PM EST ----- Regarding: Follow-up Please schedule patient for follow-up with NP or me (Dr. Loanne Drilling) or Dr. Lamonte Sakai in 10 days for suture removal.  Thank you, JE

## 2021-07-08 NOTE — Telephone Encounter (Signed)
Patient scheduled 07/22/2021 at 11:45am with Dr. Loanne Drilling. Nothing further needed.

## 2021-07-08 NOTE — Telephone Encounter (Signed)
I called the patient to make a follow up with visit around 07/21/21 with Dr. Loanne Drilling for a suture removal. Please make sure he gets scheduled.

## 2021-07-08 NOTE — Progress Notes (Signed)
Spoke w/ MD Alen Blew and he would like to adjust doses of chemo based on weight loss.  Larene Beach, PharmD

## 2021-07-08 NOTE — Progress Notes (Signed)
Hematology and Oncology Follow Up Visit  Clifford [name] [id number] [birth year] 76 y.o. 07/08/2021 8:10 AM   Principle Diagnosis: 76 year old man with lung cancer diagnosed in 2022.  He developed stage IV adenosquamous histology with PD-L1 score of 0 and pulmonary nodules.   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  Prior Therapy: Status post pleurodesis and biopsy of the pleural-based nodule. Received 6 cycles of CVP with rituximab. Therapy concluded in December 2007 after achieving remission at the time. 3.     Definitive therapy with radiation and weekly chemotherapy utilizing carboplatin and paclitaxel started on August 20, 2020.  He completed 6 cycles of therapy on September 24, 2020. 4.   Nivolumab 480 mg monthly started in June 2022.  He completed 3 cycles of therapy in August 2022 with progression of disease. 5.   Gemcitabine 1000 mg per metered square with Navelbine 25 mg per metered square day 1, day 8.  Started on February 26, 2021.  He completed 3 cycles of therapy.  Therapy discontinued because of progression of disease.  Current therapy: Taxotere 75 mg per metered square with Cyramza 10 mg/kg started on April 30, 2021.  He is here for cycle 4 of therapy.  Interim History:  Clifford [name] returns today for repeat evaluation.  Since the last visit, he reports no major changes in his health.  Pleurx catheter was placed which has helped his symptoms significantly including shortness of breath and cough.  He continues to be overall weak and continues to lose weight however.  He is able to drink but not much solid food at this time.  He denies any nausea, vomiting and has reported occasional constipation.    Medications: Updated on review. Current Outpatient Medications  Medication Sig Dispense Refill   albuterol (VENTOLIN HFA) 108 (90 Base) MCG/ACT inhaler Inhale 2 puffs into the lungs every 6 (six) hours as needed for  wheezing or shortness of breath. 8 g 6   amLODipine (NORVASC) 5 MG tablet Take 5 mg by mouth daily.      aspirin 81 MG tablet Take 81 mg by mouth daily.     atorvastatin (LIPITOR) 10 MG tablet Take 10 mg by mouth daily.     benzonatate (TESSALON) 100 MG capsule Take 100-200 mg by mouth 3 (three) times daily as needed for cough.     cetirizine (ZYRTEC) 10 MG tablet Take 10 mg by mouth daily.     fluticasone (FLONASE) 50 MCG/ACT nasal spray Place 2 sprays into both nostrils daily. 16 g 2   hydrochlorothiazide (HYDRODIURIL) 25 MG tablet Take 25 mg by mouth daily.     HYDROcodone bit-homatropine (HYCODAN) 5-1.5 MG/5ML syrup Take 5 mLs by mouth every 6 (six) hours as needed for cough. 473 mL 0   megestrol (MEGACE) 400 MG/10ML suspension Take 10 mLs (400 mg total) by mouth daily. (Patient not taking: Reported on 07/03/2021) 240 mL 1   ondansetron (ZOFRAN) 8 MG tablet Take 1 tablet (8 mg total) by mouth every 8 (eight) hours as needed for nausea or vomiting. 20 tablet 1   prochlorperazine (COMPAZINE) 10 MG tablet Take 1 tablet (10 mg total) by mouth every 6 (six) hours as needed for nausea or vomiting. 30 tablet 0   Tiotropium Bromide Monohydrate (SPIRIVA RESPIMAT) 1.25 MCG/ACT AERS Inhale 2 puffs into the lungs daily. 4 g 0   traMADol (ULTRAM) 50 MG tablet Take 50 mg by mouth daily as needed for pain.  No current facility-administered medications for this visit.    Allergies: No Known Allergies     Physical Exam:        Blood pressure 107/68, pulse (!) 115, temperature 98.1 F (36.7 C), temperature source Temporal, resp. rate 19, height 5' 11"  (1.803 m), weight 146 lb 8 oz (66.5 kg), SpO2 99 %.       ECOG: 2   General appearance: Alert, awake without any distress. Head: Atraumatic without abnormalities Oropharynx: Without any thrush or ulcers. Eyes: No scleral icterus. Lymph nodes: No lymphadenopathy noted in the cervical, supraclavicular, or axillary nodes Heart:regular  rate and rhythm, without any murmurs or gallops.   Lung: Clear to auscultation without any rhonchi, wheezes or dullness to percussion. Abdomin: Soft, nontender without any shifting dullness or ascites. Musculoskeletal: No clubbing or cyanosis. Neurological: No motor or sensory deficits. Skin: No rashes or lesions.        Lab Results: Lab Results  Component Value Date   WBC 12.0 (H) 06/11/2021   HGB 13.5 06/11/2021   HCT 40.7 06/11/2021   MCV 89.6 06/11/2021   PLT 229 06/11/2021     Chemistry      Component Value Date/Time   NA 135 06/11/2021 0824   NA 144 10/21/2016 0808   K 3.6 06/11/2021 0824   K 4.0 10/21/2016 0808   CL 97 (L) 06/11/2021 0824   CL 109 (H) 10/26/2012 0841   CO2 26 06/11/2021 0824   CO2 29 10/21/2016 0808   BUN 12 06/11/2021 0824   BUN 15.3 10/21/2016 0808   CREATININE 0.69 06/11/2021 0824   CREATININE 1.0 10/21/2016 0808      Component Value Date/Time   CALCIUM 9.4 06/11/2021 0824   CALCIUM 9.3 10/21/2016 0808   ALKPHOS 114 06/11/2021 0824   ALKPHOS 53 10/21/2016 0808   AST 21 06/11/2021 0824   AST 27 10/21/2016 0808   ALT 11 06/11/2021 0824   ALT 37 10/21/2016 0808   BILITOT 1.1 06/11/2021 0824   BILITOT 1.09 10/21/2016 0808       IMPRESSION: 1. Similar appearance of large right pleural effusion with collapse/consolidative disease in the central parahilar right lung related to the patient's known neoplasm. 2. No substantial change in innumerable irregular ill-defined metastatic nodules in both lungs with a ground-glass quality in some nodules in other small nodules showing central cavitation. No clear trend towards improvement or progression. 3. No evidence for metastatic disease in the abdomen or pelvis. 4. Prostatomegaly. 5. Aortic Atherosclerosis (ICD10-I70.0).   Impression and Plan:  76 year old with:   1.  Lung cancer diagnosed in 2022.  He developed stage IV adenosquamous with pulmonary nodules.   His disease status  was updated at this time.  CT scan obtained on June 30, 2021 was personally reviewed and showed overall stable disease.  He remains on Taxotere with Cyramza without any major complications.  Complication associated with this treatment including nausea, vomiting, myelosuppression, neutropenia and possible sepsis were reiterated.  I recommended continuing this treatment at this time given his overall disease stability.  He is agreeable to proceed.   2.  Weight loss: We continue to discuss strategy with nutritional supplements to boost his appetite and nutritional intake.  I encouraged him to take Megace at this time.  We will also arrange for IV hydration on a weekly basis between treatment.  3.  IV access: Peripheral veins are currently in use without any issues.  4.  Antiemetics: Compazine is available to him without any nausea or  vomiting.  5.  Pleural effusion: Related to malignancy and a catheter placed at this time for management of his effusion.   6.  Goals of care and prognosis: His disease is incurable and any treatment is palliative at this time.  Aggressive measures are still warranted given his reasonable performance status.  7.  Neutropenia: He will receive growth factor support after each cycle of therapy.  8.  Follow-up: In 3 weeks for the next cycle of therapy.  He will follow-up weekly as well for IV fluids.   30  minutes were dedicated to this visit.  Time was spent on reviewing laboratory data, imaging studies, disease status update and outlining future plan of care.  Zola Button, MD 1/31/20238:10 AM

## 2021-07-08 NOTE — Patient Instructions (Signed)
[facility] ONCOLOGY  Discharge Instructions: Thank you for choosing [facility] to provide your oncology and hematology care.   If you have a lab appointment with the [facility], please go directly to the [facility] and check in at the registration area.   Wear comfortable clothing and clothing appropriate for easy access to any Portacath or PICC line.   We strive to give you quality time with your provider. You may need to reschedule your appointment if you arrive late (15 or more minutes).  Arriving late affects you and other patients whose appointments are after yours.  Also, if you miss three or more appointments without notifying the office, you may be dismissed from the clinic at the providers discretion.      For prescription refill requests, have your pharmacy contact our office and allow 72 hours for refills to be completed.    Today you received the following chemotherapy and/or immunotherapy agents: Ramucirumab and docetaxel      To help prevent nausea and vomiting after your treatment, we encourage you to take your nausea medication as directed.  BELOW ARE SYMPTOMS THAT SHOULD BE REPORTED IMMEDIATELY: *FEVER GREATER THAN 100.4 F (38 C) OR HIGHER *CHILLS OR SWEATING *NAUSEA AND VOMITING THAT IS NOT CONTROLLED WITH YOUR NAUSEA MEDICATION *UNUSUAL SHORTNESS OF BREATH *UNUSUAL BRUISING OR BLEEDING *URINARY PROBLEMS (pain or burning when urinating, or frequent urination) *BOWEL PROBLEMS (unusual diarrhea, constipation, pain near the anus) TENDERNESS IN MOUTH AND THROAT WITH OR WITHOUT PRESENCE OF ULCERS (sore throat, sores in mouth, or a toothache) UNUSUAL RASH, SWELLING OR PAIN  UNUSUAL VAGINAL DISCHARGE OR ITCHING   Items with * indicate a potential emergency and should be followed up as soon as possible or go to the Emergency Department if any problems should occur.  Please show the CHEMOTHERAPY ALERT CARD or IMMUNOTHERAPY ALERT CARD  at check-in to the Emergency Department and triage nurse.  Should you have questions after your visit or need to cancel or reschedule your appointment, please contact [facility]  Dept: [phone]  and follow the prompts.  Office hours are 8:00 a.m. to 4:30 p.m. Monday - Friday. Please note that voicemails left after 4:00 p.m. may not be returned until the following business day.  We are closed weekends and major holidays. You have access to a nurse at all times for urgent questions. Please call the main number to the clinic Dept: [phone] and follow the prompts.   For any non-urgent questions, you may also contact your provider using MyChart. We now offer e-Visits for anyone 96 and older to request care online for non-urgent symptoms. For details visit [email or web address].   Also download the MyChart app! Go to the app store, search "MyChart", open the app, select [facility], and log in with your MyChart username and password.  Due to Covid, a mask is required upon entering the hospital/clinic. If you do not have a mask, one will be given to you upon arrival. For doctor visits, patients may have 1 support person aged 77 or older with them. For treatment visits, patients cannot have anyone with them due to current Covid guidelines and our immunocompromised population.

## 2021-07-10 ENCOUNTER — Other Ambulatory Visit: Payer: Self-pay

## 2021-07-10 ENCOUNTER — Telehealth: Payer: Self-pay | Admitting: Oncology

## 2021-07-10 ENCOUNTER — Inpatient Hospital Stay: Payer: Medicare HMO | Attending: Oncology

## 2021-07-10 VITALS — BP 111/67 | HR 112 | Temp 97.6°F | Resp 18

## 2021-07-10 DIAGNOSIS — C884 Extranodal marginal zone B-cell lymphoma of mucosa-associated lymphoid tissue [MALT-lymphoma]: Secondary | ICD-10-CM | POA: Insufficient documentation

## 2021-07-10 DIAGNOSIS — C3431 Malignant neoplasm of lower lobe, right bronchus or lung: Secondary | ICD-10-CM | POA: Diagnosis not present

## 2021-07-10 DIAGNOSIS — Z79899 Other long term (current) drug therapy: Secondary | ICD-10-CM | POA: Insufficient documentation

## 2021-07-10 DIAGNOSIS — R634 Abnormal weight loss: Secondary | ICD-10-CM | POA: Insufficient documentation

## 2021-07-10 DIAGNOSIS — Z5189 Encounter for other specified aftercare: Secondary | ICD-10-CM | POA: Diagnosis not present

## 2021-07-10 DIAGNOSIS — J90 Pleural effusion, not elsewhere classified: Secondary | ICD-10-CM | POA: Insufficient documentation

## 2021-07-10 DIAGNOSIS — E860 Dehydration: Secondary | ICD-10-CM | POA: Insufficient documentation

## 2021-07-10 MED ORDER — PEGFILGRASTIM-CBQV 6 MG/0.6ML ~~LOC~~ SOSY
6.0000 mg | PREFILLED_SYRINGE | Freq: Once | SUBCUTANEOUS | Status: AC
  Administered 2021-07-10: 6 mg via SUBCUTANEOUS
  Filled 2021-07-10: qty 0.6

## 2021-07-10 NOTE — Telephone Encounter (Signed)
Scheduled per 01/31 los, patient has been called and notified of upcoming appointments.

## 2021-07-11 ENCOUNTER — Telehealth: Payer: Self-pay | Admitting: Oncology

## 2021-07-11 DIAGNOSIS — I10 Essential (primary) hypertension: Secondary | ICD-10-CM | POA: Diagnosis not present

## 2021-07-11 DIAGNOSIS — J910 Malignant pleural effusion: Secondary | ICD-10-CM | POA: Diagnosis not present

## 2021-07-11 DIAGNOSIS — C3491 Malignant neoplasm of unspecified part of right bronchus or lung: Secondary | ICD-10-CM | POA: Diagnosis not present

## 2021-07-11 DIAGNOSIS — Z8572 Personal history of non-Hodgkin lymphomas: Secondary | ICD-10-CM | POA: Diagnosis not present

## 2021-07-11 DIAGNOSIS — Z4682 Encounter for fitting and adjustment of non-vascular catheter: Secondary | ICD-10-CM | POA: Diagnosis not present

## 2021-07-11 DIAGNOSIS — Z87891 Personal history of nicotine dependence: Secondary | ICD-10-CM | POA: Diagnosis not present

## 2021-07-11 DIAGNOSIS — Z7982 Long term (current) use of aspirin: Secondary | ICD-10-CM | POA: Diagnosis not present

## 2021-07-11 NOTE — Telephone Encounter (Signed)
Appt r/s per pt req, 2/3 inbasket, pt aware

## 2021-07-12 ENCOUNTER — Telehealth: Payer: Self-pay | Admitting: Pulmonary Disease

## 2021-07-12 NOTE — Telephone Encounter (Signed)
Weekend Page:  Paged by daughter, Abigail Butts. Patient is having constant chest discomfort/pressure and is unable to get comfortable. Has pleurX drain in place and has been draining 450-516mLs per day. Started to feel like he was able to take deeper breaths towards Friday. But since last night he started to have this constant chest discomfort. His SpO2 remains normal but has short shallow breaths like normal. Denies fevers, chills or sweats.   Instructed daughter to try draining the pleurX and monitor for improvement. If he continues to have chest discomfort then I would recommend bringing him to the ER for chest x-ray and further evaluation.   Freda Jackson, MD [facility] Office: [phone]   See Amion for personal pager PCCM on call pager [phone] until 7pm. Please call [facility] 7p-7a. [phone]

## 2021-07-14 ENCOUNTER — Encounter (HOSPITAL_COMMUNITY): Payer: Self-pay | Admitting: Pulmonary Disease

## 2021-07-15 ENCOUNTER — Inpatient Hospital Stay: Payer: Medicare HMO

## 2021-07-15 ENCOUNTER — Other Ambulatory Visit: Payer: Self-pay

## 2021-07-15 VITALS — BP 88/57 | HR 106 | Temp 97.8°F | Resp 24

## 2021-07-15 DIAGNOSIS — J90 Pleural effusion, not elsewhere classified: Secondary | ICD-10-CM | POA: Diagnosis not present

## 2021-07-15 DIAGNOSIS — C3492 Malignant neoplasm of unspecified part of left bronchus or lung: Secondary | ICD-10-CM

## 2021-07-15 DIAGNOSIS — Z5189 Encounter for other specified aftercare: Secondary | ICD-10-CM | POA: Diagnosis not present

## 2021-07-15 DIAGNOSIS — E860 Dehydration: Secondary | ICD-10-CM | POA: Diagnosis not present

## 2021-07-15 DIAGNOSIS — C884 Extranodal marginal zone B-cell lymphoma of mucosa-associated lymphoid tissue [MALT-lymphoma]: Secondary | ICD-10-CM | POA: Diagnosis not present

## 2021-07-15 DIAGNOSIS — C3431 Malignant neoplasm of lower lobe, right bronchus or lung: Secondary | ICD-10-CM | POA: Diagnosis not present

## 2021-07-15 DIAGNOSIS — R634 Abnormal weight loss: Secondary | ICD-10-CM | POA: Diagnosis not present

## 2021-07-15 DIAGNOSIS — Z79899 Other long term (current) drug therapy: Secondary | ICD-10-CM | POA: Diagnosis not present

## 2021-07-15 MED ORDER — SODIUM CHLORIDE 0.9 % IV SOLN: Freq: Once | INTRAVENOUS | Status: AC

## 2021-07-15 NOTE — Progress Notes (Signed)
Per Dr. Alen Blew, okay for patient to be discharged with post-rehydration vitals.

## 2021-07-15 NOTE — Patient Instructions (Signed)

## 2021-07-16 ENCOUNTER — Telehealth: Payer: Self-pay | Admitting: Pulmonary Disease

## 2021-07-16 NOTE — Telephone Encounter (Signed)
Spoke with Abigail Butts  She states that pt has yet to receive any pleurx supplies from [facility]  She says Dr Loanne Drilling filled out the form and faxed to them the date of procedure 07/07/21 I called [facility] and was advised that they never received anything  I printed form, had JE sign and have faxed to them  I gave them pt contact info as well as info for daughter, Abigail Butts  Pt should be receiving supplies by 07/18/21  I changed appt from [facility] to [facility] on 07/22/21 per request  Nothing further needed  (Received confirmation that my fax went through and put this in the scan folder)

## 2021-07-18 DIAGNOSIS — J910 Malignant pleural effusion: Secondary | ICD-10-CM | POA: Diagnosis not present

## 2021-07-18 DIAGNOSIS — C3490 Malignant neoplasm of unspecified part of unspecified bronchus or lung: Secondary | ICD-10-CM | POA: Diagnosis not present

## 2021-07-22 ENCOUNTER — Inpatient Hospital Stay: Payer: Medicare HMO

## 2021-07-22 ENCOUNTER — Other Ambulatory Visit: Payer: Self-pay

## 2021-07-22 ENCOUNTER — Ambulatory Visit (INDEPENDENT_AMBULATORY_CARE_PROVIDER_SITE_OTHER): Payer: Medicare HMO | Admitting: Pulmonary Disease

## 2021-07-22 ENCOUNTER — Encounter: Payer: Self-pay | Admitting: Pulmonary Disease

## 2021-07-22 ENCOUNTER — Ambulatory Visit: Payer: Medicare HMO | Admitting: Pulmonary Disease

## 2021-07-22 ENCOUNTER — Ambulatory Visit: Payer: Medicare HMO | Admitting: Primary Care

## 2021-07-22 VITALS — BP 108/70 | HR 128 | Wt 137.0 lb

## 2021-07-22 DIAGNOSIS — J90 Pleural effusion, not elsewhere classified: Secondary | ICD-10-CM | POA: Diagnosis not present

## 2021-07-22 DIAGNOSIS — J910 Malignant pleural effusion: Secondary | ICD-10-CM

## 2021-07-22 NOTE — Patient Instructions (Addendum)
Recurrent right malignant pleural effusion s/p PleurX placement on 07/07/21 --Continue to drain 300 cc daily --If you experience pain/discomfort, reduce to 200 cc daily or every other day drainage if discomfort persists --Please call if drainage <50cc. He will likely need a chest XR  Follow-up with Dr. Lamonte Sakai in 2 months. Call sooner if needed.
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
## 2021-07-28 ENCOUNTER — Telehealth: Payer: Self-pay

## 2021-07-28 DIAGNOSIS — C3490 Malignant neoplasm of unspecified part of unspecified bronchus or lung: Secondary | ICD-10-CM | POA: Diagnosis not present

## 2021-07-28 DIAGNOSIS — J910 Malignant pleural effusion: Secondary | ICD-10-CM | POA: Diagnosis not present

## 2021-07-28 NOTE — Telephone Encounter (Signed)
Pt's daughter Abigail Butts) called to say Dad is not doing well. He can walk about 25 feet before tiring out and he is not eating very much per day. Abigail Butts will try to get patient to apt with Dr. Alen Blew on 07/30/21 to discuss her concerns about his ability to tolerate chemo. She discussed possibility of needing palliative services. Patient has home health services. Patient would like to continue with chemo if he can.

## 2021-07-28 NOTE — Telephone Encounter (Signed)
Patient called again to let me know that she is concerned for her father. She would like to to have him evaluated for home oxygen, feeding tube and home IV fluid infusions. Reiterated that Dr. Alen Blew will evaluate pt at appointment on 07/30/21.

## 2021-07-29 MED FILL — Dexamethasone Sodium Phosphate Inj 100 MG/10ML: INTRAMUSCULAR | Qty: 1 | Status: AC

## 2021-07-30 ENCOUNTER — Other Ambulatory Visit: Payer: Self-pay | Admitting: *Deleted

## 2021-07-30 ENCOUNTER — Inpatient Hospital Stay: Payer: Medicare HMO | Admitting: Dietician

## 2021-07-30 ENCOUNTER — Inpatient Hospital Stay: Payer: Medicare HMO

## 2021-07-30 ENCOUNTER — Other Ambulatory Visit: Payer: Self-pay

## 2021-07-30 ENCOUNTER — Inpatient Hospital Stay (HOSPITAL_BASED_OUTPATIENT_CLINIC_OR_DEPARTMENT_OTHER): Payer: Medicare HMO

## 2021-07-30 ENCOUNTER — Inpatient Hospital Stay (HOSPITAL_BASED_OUTPATIENT_CLINIC_OR_DEPARTMENT_OTHER): Payer: Medicare HMO | Admitting: Oncology

## 2021-07-30 VITALS — BP 105/70 | HR 123 | Temp 98.1°F | Resp 16 | Ht 71.0 in | Wt 133.4 lb

## 2021-07-30 DIAGNOSIS — C3492 Malignant neoplasm of unspecified part of left bronchus or lung: Secondary | ICD-10-CM

## 2021-07-30 DIAGNOSIS — Z5189 Encounter for other specified aftercare: Secondary | ICD-10-CM | POA: Diagnosis not present

## 2021-07-30 DIAGNOSIS — Z79899 Other long term (current) drug therapy: Secondary | ICD-10-CM | POA: Diagnosis not present

## 2021-07-30 DIAGNOSIS — J90 Pleural effusion, not elsewhere classified: Secondary | ICD-10-CM | POA: Diagnosis not present

## 2021-07-30 DIAGNOSIS — E860 Dehydration: Secondary | ICD-10-CM | POA: Diagnosis not present

## 2021-07-30 DIAGNOSIS — C3431 Malignant neoplasm of lower lobe, right bronchus or lung: Secondary | ICD-10-CM | POA: Diagnosis not present

## 2021-07-30 DIAGNOSIS — R634 Abnormal weight loss: Secondary | ICD-10-CM | POA: Diagnosis not present

## 2021-07-30 DIAGNOSIS — C884 Extranodal marginal zone B-cell lymphoma of mucosa-associated lymphoid tissue [MALT-lymphoma]: Secondary | ICD-10-CM | POA: Diagnosis not present

## 2021-07-30 LAB — CBC WITH DIFFERENTIAL (CANCER CENTER ONLY)
Abs Immature Granulocytes: 0.08 10*3/uL — ABNORMAL HIGH (ref 0.00–0.07)
Basophils Absolute: 0.1 10*3/uL (ref 0.0–0.1)
Basophils Relative: 1 %
Eosinophils Absolute: 0 10*3/uL (ref 0.0–0.5)
Eosinophils Relative: 0 %
HCT: 42.5 % (ref 39.0–52.0)
Hemoglobin: 13.6 g/dL (ref 13.0–17.0)
Immature Granulocytes: 1 %
Lymphocytes Relative: 13 %
Lymphs Abs: 2.2 10*3/uL (ref 0.7–4.0)
MCH: 28.5 pg (ref 26.0–34.0)
MCHC: 32 g/dL (ref 30.0–36.0)
MCV: 89.1 fL (ref 80.0–100.0)
Monocytes Absolute: 0.9 10*3/uL (ref 0.1–1.0)
Monocytes Relative: 5 %
Neutro Abs: 13.6 10*3/uL — ABNORMAL HIGH (ref 1.7–7.7)
Neutrophils Relative %: 80 %
Platelet Count: 329 10*3/uL (ref 150–400)
RBC: 4.77 MIL/uL (ref 4.22–5.81)
RDW: 17.3 % — ABNORMAL HIGH (ref 11.5–15.5)
WBC Count: 16.9 10*3/uL — ABNORMAL HIGH (ref 4.0–10.5)
nRBC: 0 % (ref 0.0–0.2)

## 2021-07-30 LAB — CMP (CANCER CENTER ONLY)
ALT: 10 U/L (ref 0–44)
AST: 18 U/L (ref 15–41)
Albumin: 3.2 g/dL — ABNORMAL LOW (ref 3.5–5.0)
Alkaline Phosphatase: 123 U/L (ref 38–126)
Anion gap: 9 (ref 5–15)
BUN: 12 mg/dL (ref 8–23)
CO2: 24 mmol/L (ref 22–32)
Calcium: 9 mg/dL (ref 8.9–10.3)
Chloride: 102 mmol/L (ref 98–111)
Creatinine: 0.56 mg/dL — ABNORMAL LOW (ref 0.61–1.24)
GFR, Estimated: >60 mL/min (ref >60)
Glucose, Bld: 101 mg/dL — ABNORMAL HIGH (ref 70–99)
Potassium: 4.3 mmol/L (ref 3.5–5.1)
Sodium: 135 mmol/L (ref 135–145)
Total Bilirubin: 0.8 mg/dL (ref 0.3–1.2)
Total Protein: 6.1 g/dL — ABNORMAL LOW (ref 6.5–8.1)

## 2021-07-30 NOTE — Progress Notes (Signed)
Per Dr.[name], called for referral to [facility]. Spoke with Herbie Baltimore and faxed pt demographics and other required info to [phone]. Confirmation of fax was received

## 2021-07-30 NOTE — Progress Notes (Signed)
Hematology and Oncology Follow Up Visit  Clifford [name] [id number] 11/04/1945 76 y.o. 07/30/2021 8:50 AM   Principle Diagnosis: 76 year old man with stage IV adenosquamous lung cancer diagnosed in 2022.  He was found to have PD-L1 score of 0 with actionable mutation and bilateral pulmonary nodules.   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  Prior Therapy: Status post pleurodesis and biopsy of the pleural-based nodule. Received 6 cycles of CVP with rituximab. Therapy concluded in December 2007 after achieving remission at the time. 3.     Definitive therapy with radiation and weekly chemotherapy utilizing carboplatin and paclitaxel started on August 20, 2020.  He completed 6 cycles of therapy on September 24, 2020. 4.   Nivolumab 480 mg monthly started in June 2022.  He completed 3 cycles of therapy in August 2022 with progression of disease. 5.   Gemcitabine 1000 mg per metered square with Navelbine 25 mg per metered square day 1, day 8.  Started on February 26, 2021.  He completed 3 cycles of therapy.  Therapy discontinued because of progression of disease.  Current therapy: Taxotere 75 mg per metered square with Cyramza 10 mg/kg started on April 30, 2021.  He is here for cycle 4 of therapy.  Interim History:  Clifford [name] presents today for repeat evaluation with his daughter.  Since the last visit, he continues to experience further decline.  His appetite has been poor performance status has worsened.  He is bedridden for the most part with overall poor quality of life.  His respiratory status is reasonable with improved breathing after draining pleural effusion.  He denies any bone pain or pathological fractures.    Medications: Reviewed without changes. Current Outpatient Medications  Medication Sig Dispense Refill   albuterol (VENTOLIN HFA) 108 (90 Base) MCG/ACT inhaler Inhale 2 puffs into the lungs every 6 (six) hours  as needed for wheezing or shortness of breath. 8 g 6   amLODipine (NORVASC) 5 MG tablet Take 5 mg by mouth daily.  (Patient not taking: Reported on 07/22/2021)     aspirin 81 MG tablet Take 81 mg by mouth daily.     atorvastatin (LIPITOR) 10 MG tablet Take 10 mg by mouth daily.     benzonatate (TESSALON) 100 MG capsule Take 100-200 mg by mouth 3 (three) times daily as needed for cough.     cetirizine (ZYRTEC) 10 MG tablet Take 10 mg by mouth daily.     fluticasone (FLONASE) 50 MCG/ACT nasal spray Place 2 sprays into both nostrils daily. 16 g 2   hydrochlorothiazide (HYDRODIURIL) 25 MG tablet Take 25 mg by mouth daily. (Patient not taking: Reported on 07/22/2021)     HYDROcodone bit-homatropine (HYCODAN) 5-1.5 MG/5ML syrup Take 5 mLs by mouth every 6 (six) hours as needed for cough. 473 mL 0   megestrol (MEGACE) 400 MG/10ML suspension Take 10 mLs (400 mg total) by mouth daily. 240 mL 1   ondansetron (ZOFRAN) 8 MG tablet Take 1 tablet (8 mg total) by mouth every 8 (eight) hours as needed for nausea or vomiting. 20 tablet 1   polyethylene glycol (MIRALAX) 17 g packet Take 17 g by mouth daily. 28 each 3   prochlorperazine (COMPAZINE) 10 MG tablet Take 1 tablet (10 mg total) by mouth every 6 (six) hours as needed for nausea or vomiting. 30 tablet 0   Tiotropium Bromide Monohydrate (SPIRIVA RESPIMAT) 1.25 MCG/ACT AERS Inhale 2 puffs into the lungs daily. 4 g  0   traMADol (ULTRAM) 50 MG tablet Take 50 mg by mouth daily as needed for pain.     No current facility-administered medications for this visit.    Allergies: No Known Allergies     Physical Exam:        Blood pressure 105/70, pulse (!) 123, temperature 98.1 F (36.7 C), temperature source Temporal, resp. rate 16, height _0  (1.803 m), weight 133 lb 6.4 oz (60.5 kg), SpO2 98 %.       ECOG: 3    General appearance: Chronically ill-appearing man with mild distress. Head: Normocephalic without any trauma Oropharynx: Mucous  membranes are moist and pink without any thrush or ulcers. Eyes: Pupils are equal and round reactive to light. Lymph nodes: No cervical, supraclavicular, inguinal or axillary lymphadenopathy.   Heart:regular rate and rhythm.  S1 and S2 without leg edema. Lung: Expiratory wheezes bilaterally. Abdomin: Soft, nontender, nondistended with good bowel sounds.  No hepatosplenomegaly. Musculoskeletal: No joint deformity or effusion.  Full range of motion noted. Neurological: No deficits noted on motor, sensory and deep tendon reflex exam. Skin: No petechial rash or dryness.  Appeared moist.          Lab Results: Lab Results  Component Value Date   WBC 16.9 (H) 07/30/2021   HGB 13.6 07/30/2021   HCT 42.5 07/30/2021   MCV 89.1 07/30/2021   PLT 329 07/30/2021     Chemistry      Component Value Date/Time   NA 135 07/30/2021 0803   NA 144 10/21/2016 0808   K 4.3 07/30/2021 0803   K 4.0 10/21/2016 0808   CL 102 07/30/2021 0803   CL 109 (H) 10/26/2012 0841   CO2 24 07/30/2021 0803   CO2 29 10/21/2016 0808   BUN 12 07/30/2021 0803   BUN 15.3 10/21/2016 0808   CREATININE 0.56 (L) 07/30/2021 0803   CREATININE 1.0 10/21/2016 0808      Component Value Date/Time   CALCIUM 9.0 07/30/2021 0803   CALCIUM 9.3 10/21/2016 0808   ALKPHOS 123 07/30/2021 0803   ALKPHOS 53 10/21/2016 0808   AST 18 07/30/2021 0803   AST 27 10/21/2016 0808   ALT 10 07/30/2021 0803   ALT 37 10/21/2016 0808   BILITOT 0.8 07/30/2021 0803   BILITOT 1.09 10/21/2016 0808        Impression and Plan:  76 year old with:   1.  Stage IV adenosquamous carcinoma of the lung  diagnosed in 2022.    He is currently receiving palliative chemotherapy although his clinical status has deteriorated and his condition has worsened at this time despite overall stable disease.  At this time, I have recommended to suspend chemotherapy and proceed with hospice care.  He is approaching end-stage status and would benefit from  hospice enrollment.  He and his family are in agreement at this time.   2.  Weight loss: Related to end-stage status and I do not think he would benefit from feeding tube.     3.  Pleural effusion: Pleurx catheter is helping with drainage and controlling his symptoms.   4.  Goals of care and prognosis: He is at end-stage lung cancer at this time with very limited life expectancy of less than 6 months.  I recommended proceeding with hospice care.  We also discussed DO NOT RESUSCITATE CODE STATUS which she is in agreement with.  We will solicit hospice help to have out of the hospital order and he understands should he become hospitalized no aggressive  measures will take place.    5.  Follow-up: Will be as needed and will rely on hospice for updates.   30  minutes were spent on this encounter.  The time was dedicated to updating his disease status, discussing treatment choices and addressing complication related to cancer and cancer therapy.  Zola Button, MD 2/22/20238:50 AM

## 2021-07-31 NOTE — Addendum Note (Signed)
Addended by: Tora Kindred on: 07/31/2021 03:25 PM   Modules accepted: Orders

## 2021-08-01 ENCOUNTER — Ambulatory Visit: Payer: Medicare HMO

## 2021-08-08 ENCOUNTER — Telehealth: Payer: Self-pay | Admitting: *Deleted

## 2021-08-08 ENCOUNTER — Other Ambulatory Visit: Payer: Self-pay | Admitting: Oncology

## 2021-08-08 MED ORDER — LORAZEPAM 1 MG PO TABS: 1.0000 mg | ORAL_TABLET | Freq: Three times a day (TID) | ORAL | 0 refills | Status: AC | PRN

## 2021-08-08 NOTE — Telephone Encounter (Signed)
Caryl Pina from [facility] called requesting prescription for low dose Lorazepam to help pt with sleeping and anxiety. Called pt family to make aware prescription has been sent to pharmacy ?

## 2021-08-11 ENCOUNTER — Other Ambulatory Visit: Payer: Self-pay | Admitting: Oncology

## 2021-08-11 ENCOUNTER — Telehealth: Payer: Self-pay | Admitting: *Deleted

## 2021-08-11 MED ORDER — MORPHINE SULFATE 15 MG PO TABS: 15.0000 mg | ORAL_TABLET | ORAL | 0 refills | Status: AC | PRN

## 2021-08-11 MED ORDER — TRAZODONE HCL 50 MG PO TABS: 50.0000 mg | ORAL_TABLET | Freq: Every day | ORAL | 3 refills | Status: AC

## 2021-08-11 NOTE — Telephone Encounter (Signed)
Called wife and let her know that Dr Alen Blew has sent in prescription for Trazodone to help with sleep. To stop taking Lorazepam. Also sent in [facility] for severe pain. Reviewed directions ?

## 2021-08-11 NOTE — Telephone Encounter (Signed)
Janett Billow, RN from [employer] called to report Lorazepam that was prescribed on Friday for anxiety and sleep had the reverse effect on patient. Is asking if Dr Alen Blew will prescribe Trazadone.  ? ?Also states he is having lower back pain and some SOB. Has Tramadol for pain, but is asking to go ahead and send prescription for low dose Morphine. ? ?Has not eaten any food X 5 days ?

## 2021-09-06 DEATH — deceased

## 2021-09-19 ENCOUNTER — Ambulatory Visit: Payer: Medicare HMO | Admitting: Emergency Medicine

## 2022-03-20 IMAGING — DX DG CHEST 2V
2 series · 2 of 2 positions shown · non-contrast
Comparison: 06/24/2021

CLINICAL DATA: Pleural effusion.

EXAM:
CHEST - 2 VIEW

[chest pa]
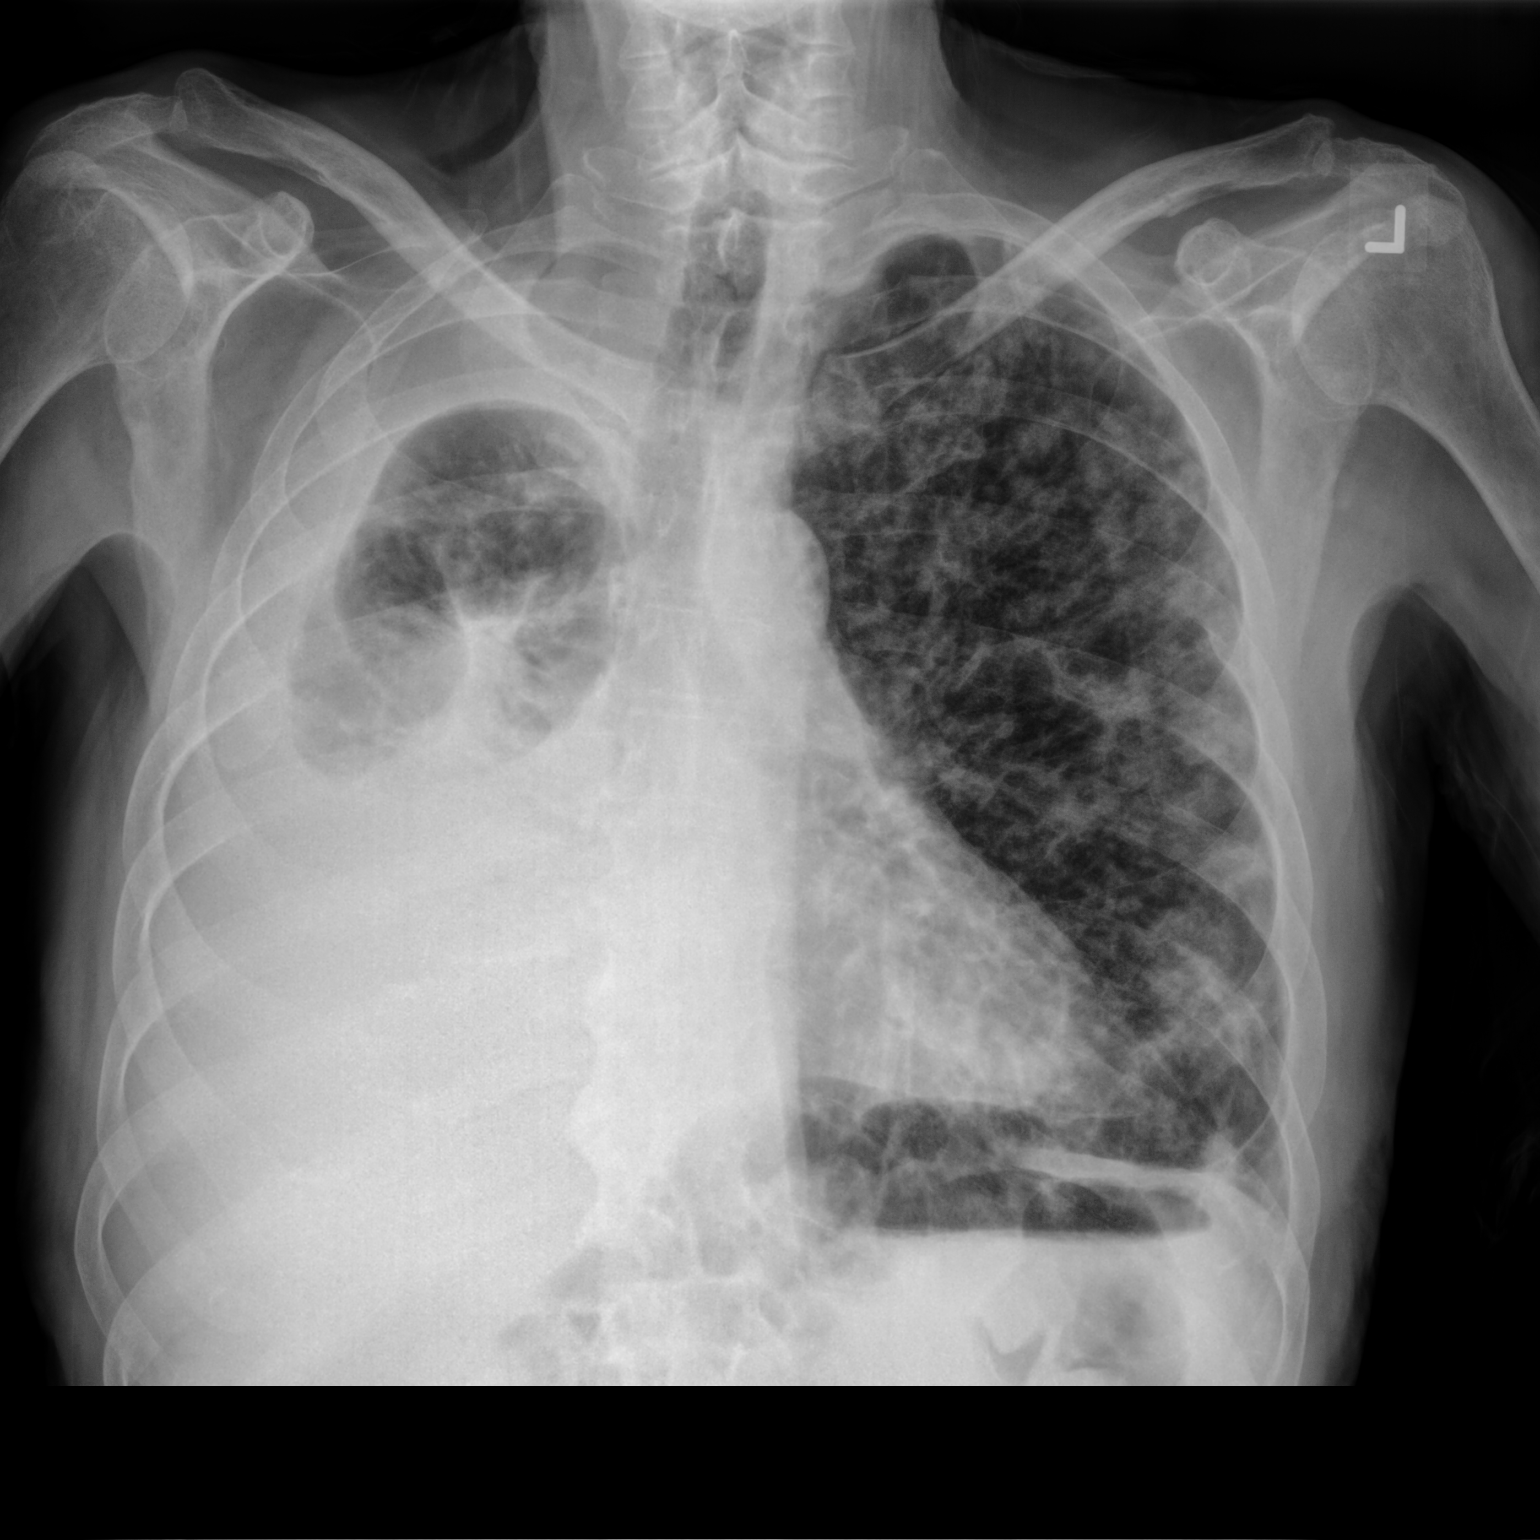

[chest lat]
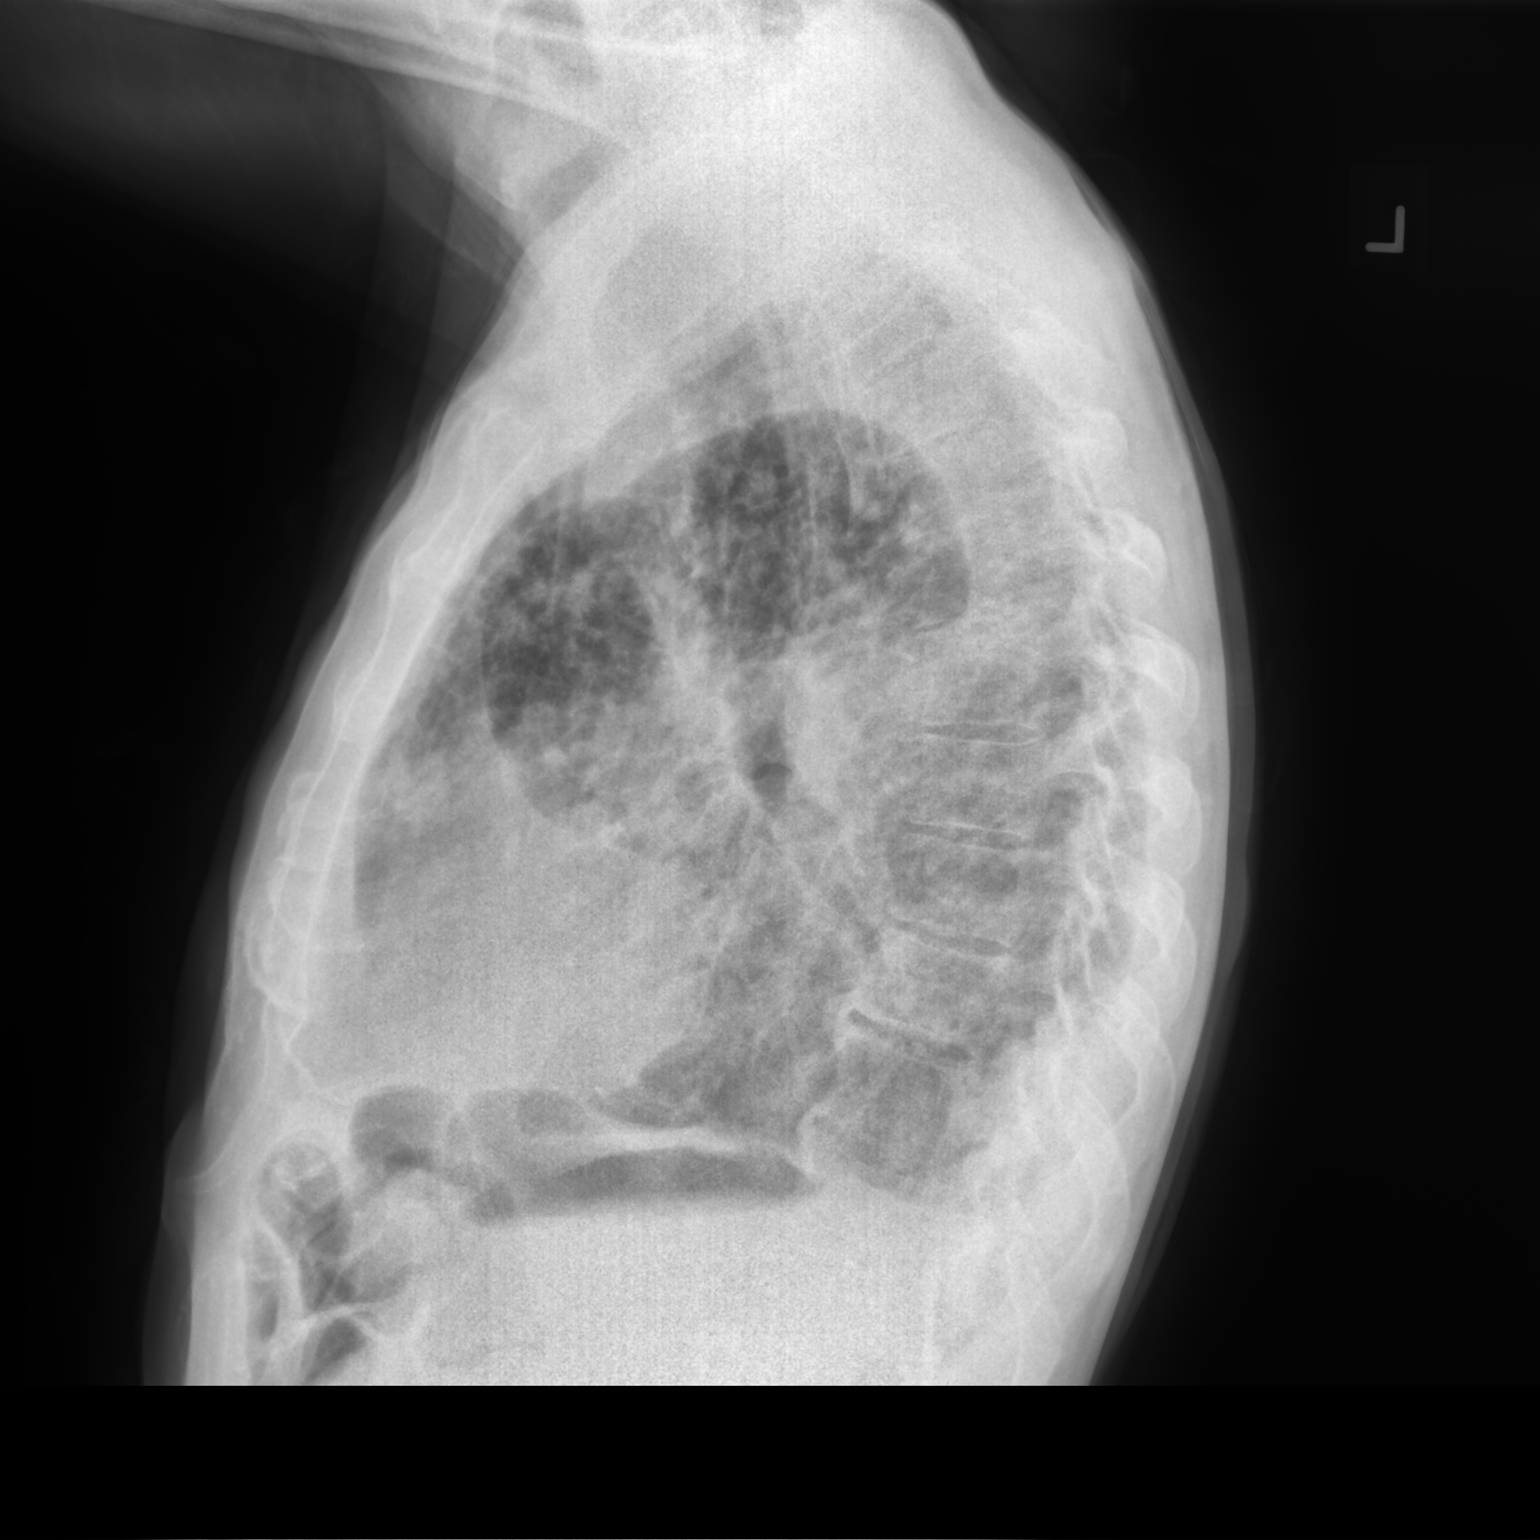

[2 of 2 positions shown; findings below may reference images not displayed]

FINDINGS: The cardiomediastinal silhouette is unchanged with persistent
obscuration of the right heart border. A large right pleural
effusion and associated right mid and lower lung atelectasis or
consolidation are unchanged. Numerous nodular densities are again
seen throughout the left lung and aerated portion of the right lung
consistent with previously shown metastases. No pneumothorax is
identified. No acute osseous abnormality is seen.
IMPRESSION: 1. Unchanged large right pleural effusion and right lung atelectasis
or consolidation.
2. Diffuse pulmonary metastases.

## 2022-03-20 IMAGING — CT CT CHEST-ABD-PELV W/ CM
2 of 5 series · 13 of 46 positions shown, 15 images · IV contrast (APPLIED)
Comparison: 04/23/2021

CLINICAL DATA: Non-small-cell lung cancer.  Restaging.

EXAM:
CT CHEST, ABDOMEN, AND PELVIS WITH CONTRAST
TECHNIQUE: Multidetector CT imaging of the chest, abdomen and pelvis was
performed following the standard protocol during bolus
administration of intravenous contrast.

[Series 2: cap with · axial · 0.78mm/px · z∈[-379,+191]mm · 10 of 140 slices shown, 12 images]
[im 13/140  soft-tissue]
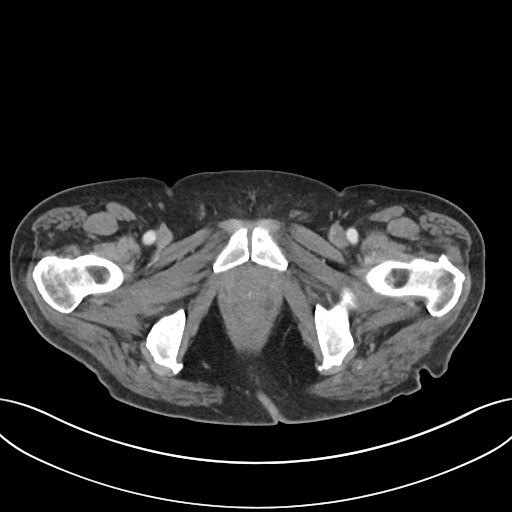
[im 13/140  bone]
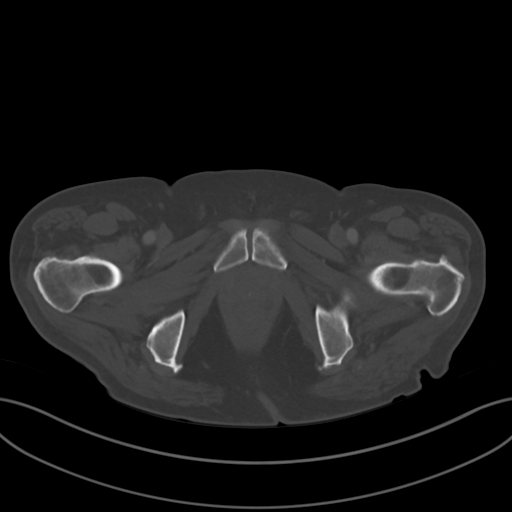
[im 26/140  soft-tissue]
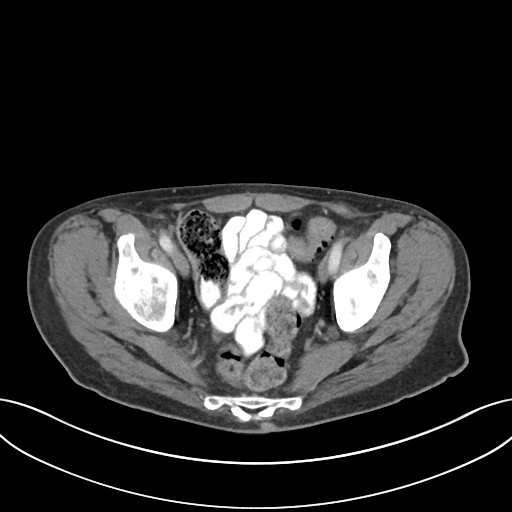
[im 38/140  soft-tissue]
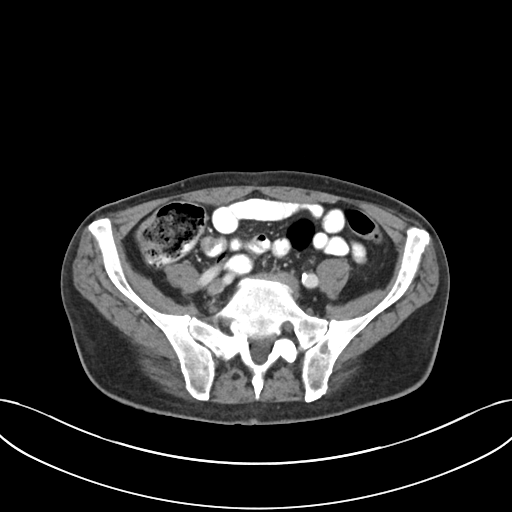
[im 51/140  soft-tissue]
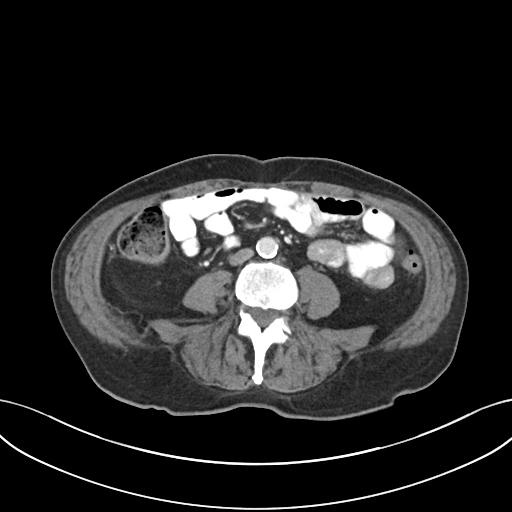
[im 64/140  soft-tissue]
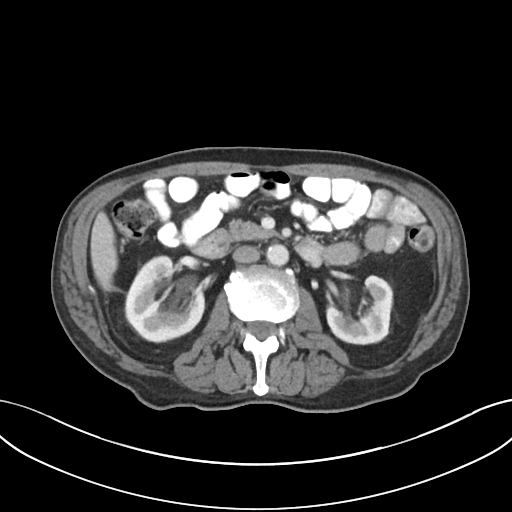
[im 76/140  soft-tissue]
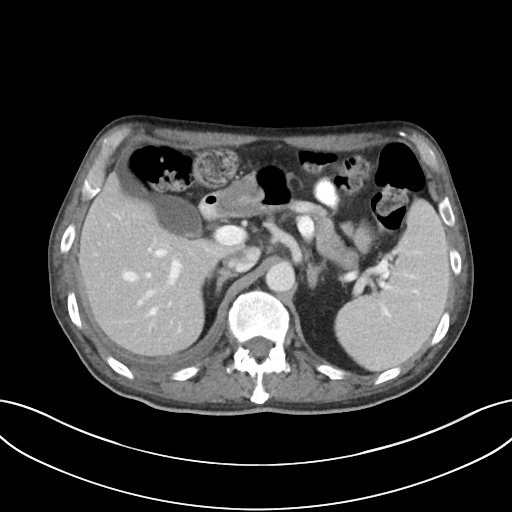
[im 89/140  soft-tissue]
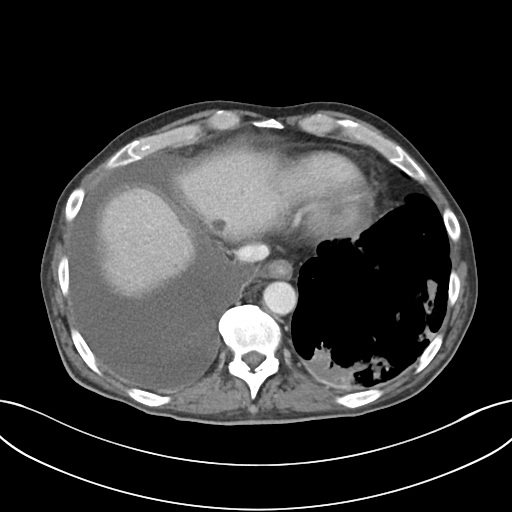
[im 102/140  soft-tissue]
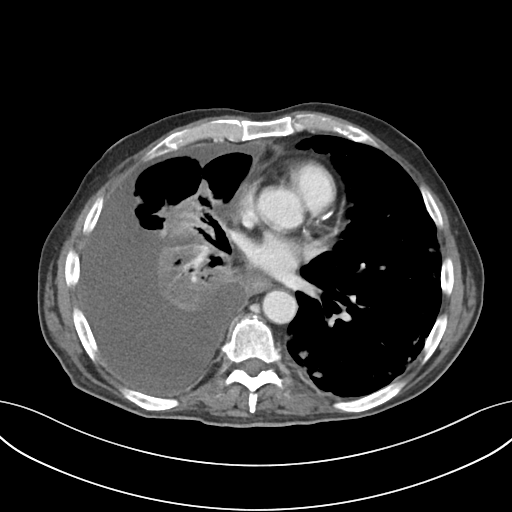
[im 114/140  soft-tissue]
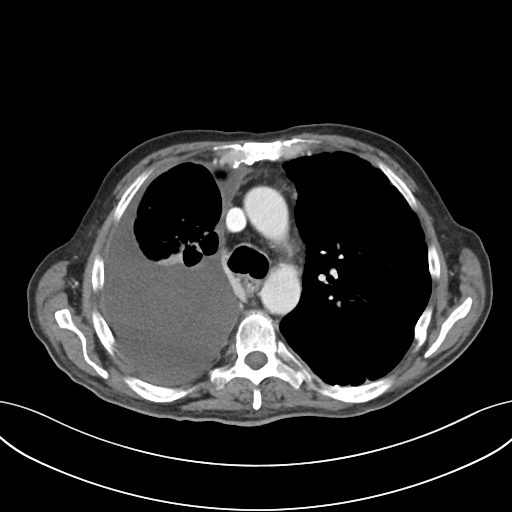
[im 114/140  bone]
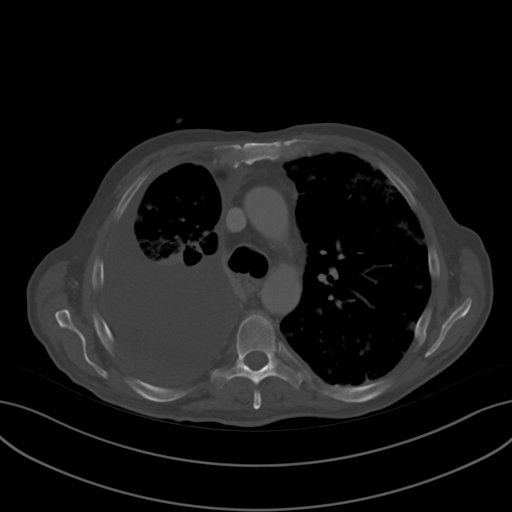
[im 127/140  soft-tissue]
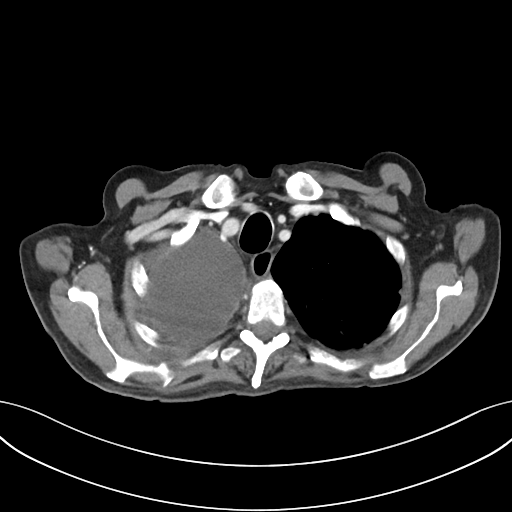

[Series 5: coronal · coronal · 0.83mm/px · 3 of 94 slices shown]
[im 32/94  soft-tissue]
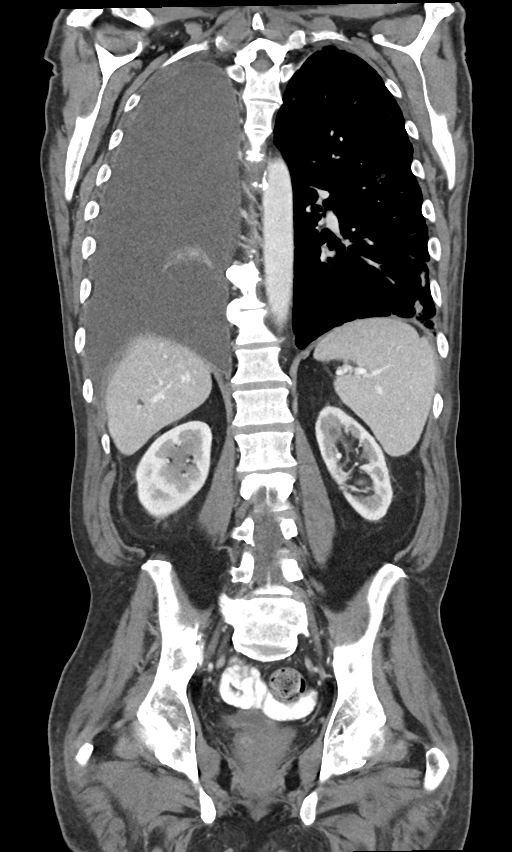
[im 42/94  soft-tissue]
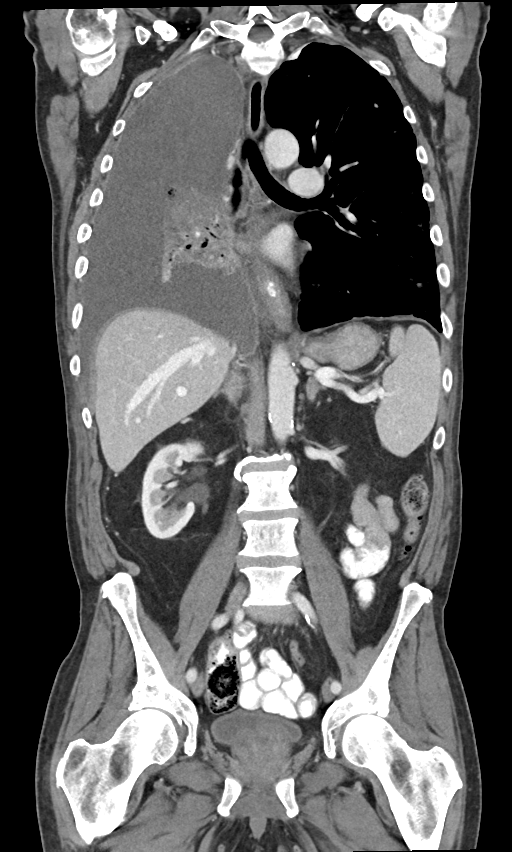
[im 52/94  soft-tissue]
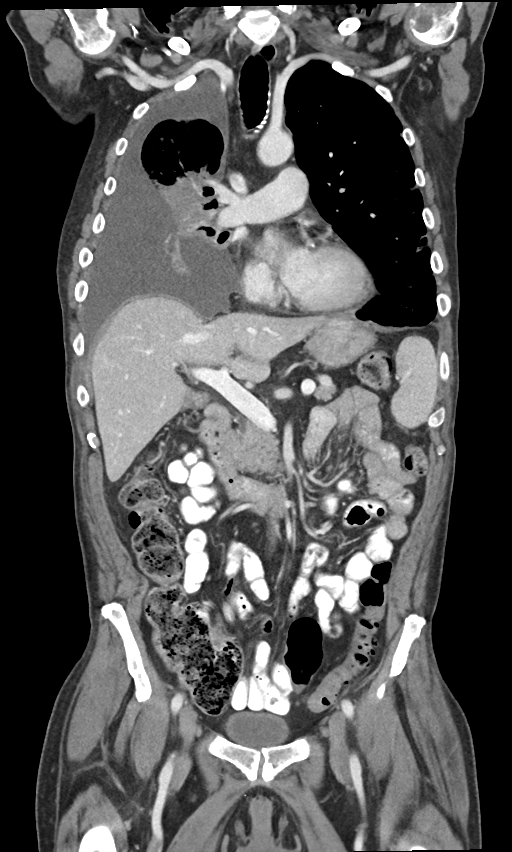

[13 of 46 positions shown; findings below may reference images not displayed]

RADIATION DOSE REDUCTION: This exam was performed according to the
departmental dose-optimization program which includes automated
exposure control, adjustment of the mA and/or kV according to
patient size and/or use of iterative reconstruction technique.

CONTRAST:  85mL OMNIPAQUE IOHEXOL 300 MG/ML  SOLN
FINDINGS: CT CHEST FINDINGS

Cardiovascular: The heart size is normal. No substantial pericardial
effusion. Coronary artery calcification is evident. Mild
atherosclerotic calcification is noted in the wall of the thoracic
aorta.

Mediastinum/Nodes: No mediastinal lymphadenopathy. No left hilar
lymphadenopathy. Stable appearance of soft tissue attenuation in the
right hilar region related to the patient's known neoplasm. The
esophagus has normal imaging features. There is no axillary
lymphadenopathy.

Lungs/Pleura: Similar appearance large right pleural effusion with
collapse/consolidative disease in the central parahilar right lung.
Innumerable irregular ill-defined pulmonary nodules are identified
in both lungs with a ground-glass quality in some nodules in other
small nodules showing central cavitation. Index nodule along the
left major fissure previously measured at 2.2 cm is now 2.0 cm on
image 85/4. A second peripheral left lower lobe nodule measured on
the same image previously at 1.8 cm is 1.9 cm today on image 85/4
posteromedially. A third index area of masslike consolidative
opacity in the posteromedial left lower lobe measured previously a
4.5 cm is 3.7 cm today when measured at a similar level and in the
same dimension (image 126/series 4).

Musculoskeletal: No worrisome lytic or sclerotic osseous
abnormality.

CT ABDOMEN PELVIS FINDINGS

Hepatobiliary: Scattered tiny hypodensities in the liver parenchyma
are again noted, too small to characterize but likely benign given
interval stability. There is no evidence for gallstones, gallbladder
wall thickening, or pericholecystic fluid. No intrahepatic or
extrahepatic biliary dilation.

Pancreas: No focal mass lesion. No dilatation of the main duct. No
intraparenchymal cyst. No peripancreatic edema.

Spleen: No splenomegaly. No focal mass lesion.

Adrenals/Urinary Tract: No adrenal nodule or mass. Central sinus
cysts are noted in the right kidney. Small cortical cortical cysts
in the left kidney are stable. No evidence for hydroureter. The
urinary bladder appears normal for the degree of distention.

Stomach/Bowel: Similar prominent wall thickening in the antro
pyloric region of the stomach. No evidence of outlet obstruction.
Duodenum is normally positioned as is the ligament of Treitz. No
small bowel wall thickening. No small bowel dilatation. The terminal
ileum is normal. The appendix is normal. No gross colonic mass. No
colonic wall thickening. Diverticular changes are noted in the left
colon without evidence of diverticulitis.

Vascular/Lymphatic: There is moderate atherosclerotic calcification
of the abdominal aorta without aneurysm. There is no gastrohepatic
or hepatoduodenal ligament lymphadenopathy. No retroperitoneal or
mesenteric lymphadenopathy. No pelvic sidewall lymphadenopathy.

Reproductive: Prostate gland is enlarged.

Other: No intraperitoneal free fluid.

Musculoskeletal: No worrisome lytic or sclerotic osseous
abnormality.
IMPRESSION: 1. Similar appearance of large right pleural effusion with
collapse/consolidative disease in the central parahilar right lung
related to the patient's known neoplasm.
2. No substantial change in innumerable irregular ill-defined
metastatic nodules in both lungs with a ground-glass quality in some
nodules in other small nodules showing central cavitation. No clear
trend towards improvement or progression.
3. No evidence for metastatic disease in the abdomen or pelvis.
4. Prostatomegaly.
5. Aortic Atherosclerosis ([id number]).

## 2022-03-27 IMAGING — DX DG CHEST 1V PORT
2 series · 2 of 2 positions shown · non-contrast
Comparison: Radiographs 07/01/2021 and 06/30/2021.  CT 06/30/2021.

CLINICAL DATA: Post thoracentesis.

EXAM:
PORTABLE CHEST 1 VIEW

[chest ap (1 of 2)]
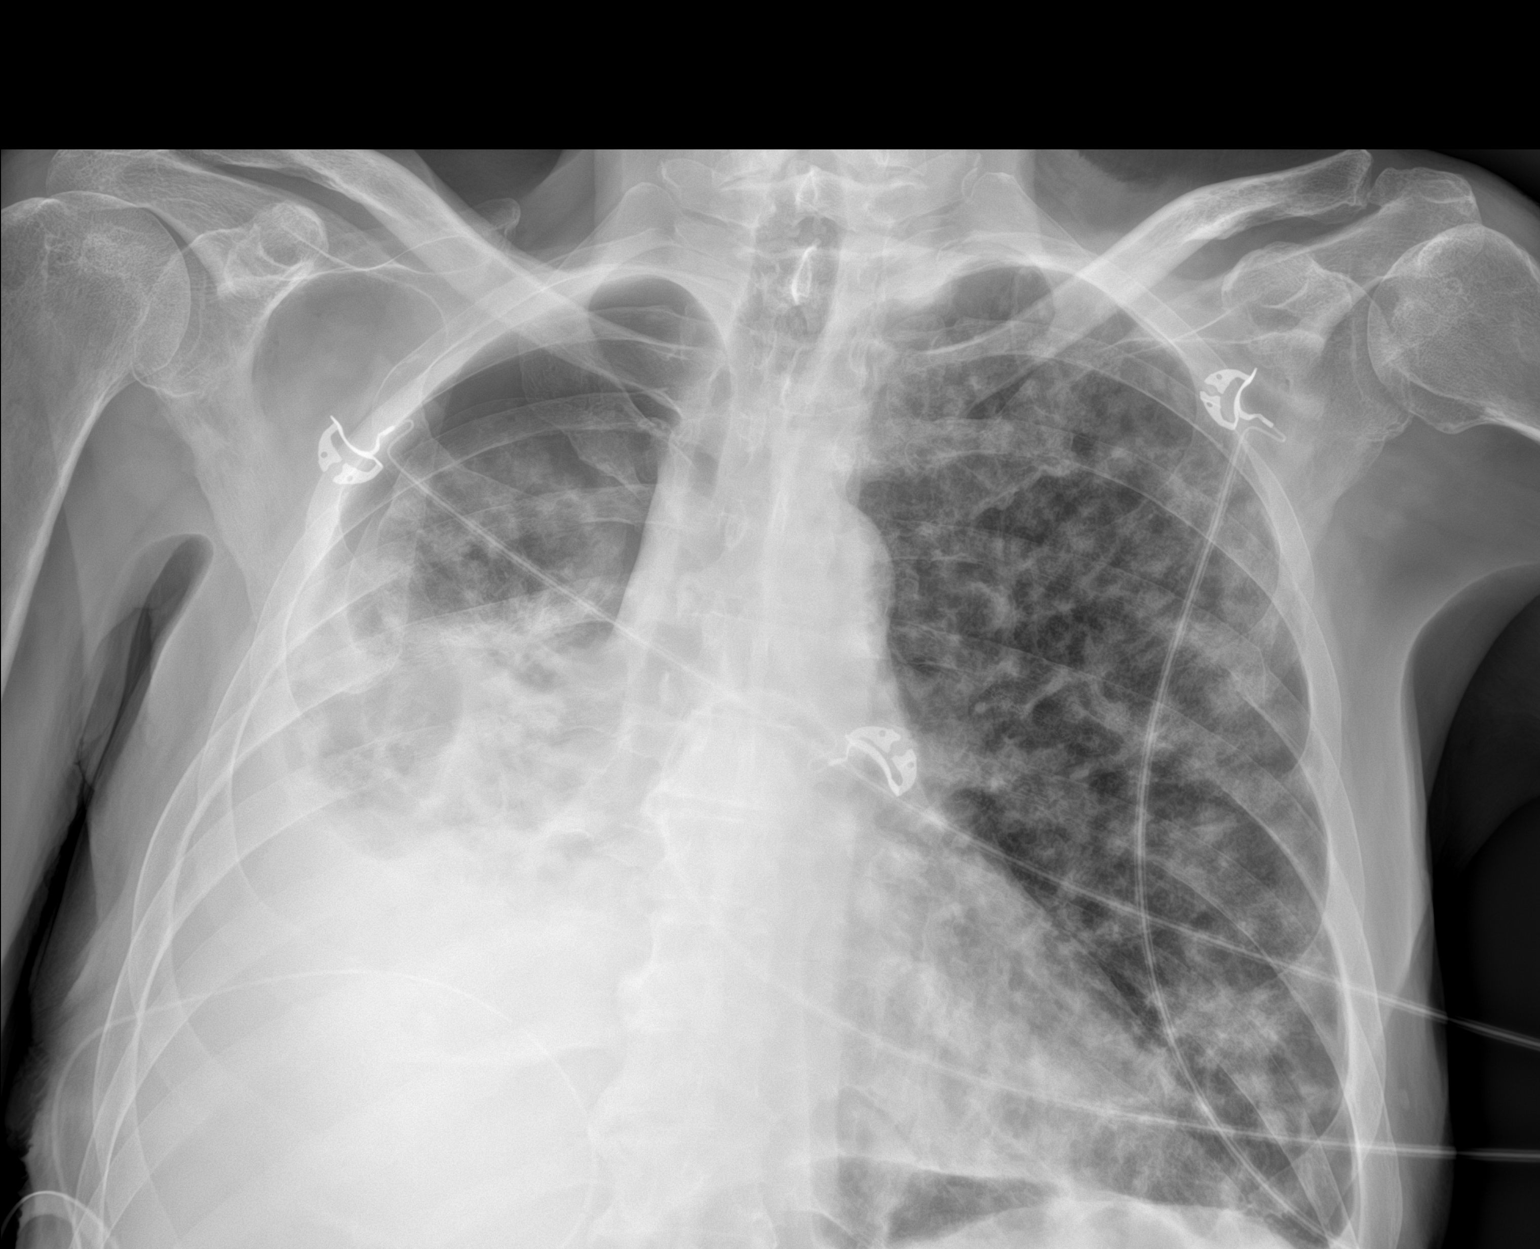

[chest ap (2 of 2)]
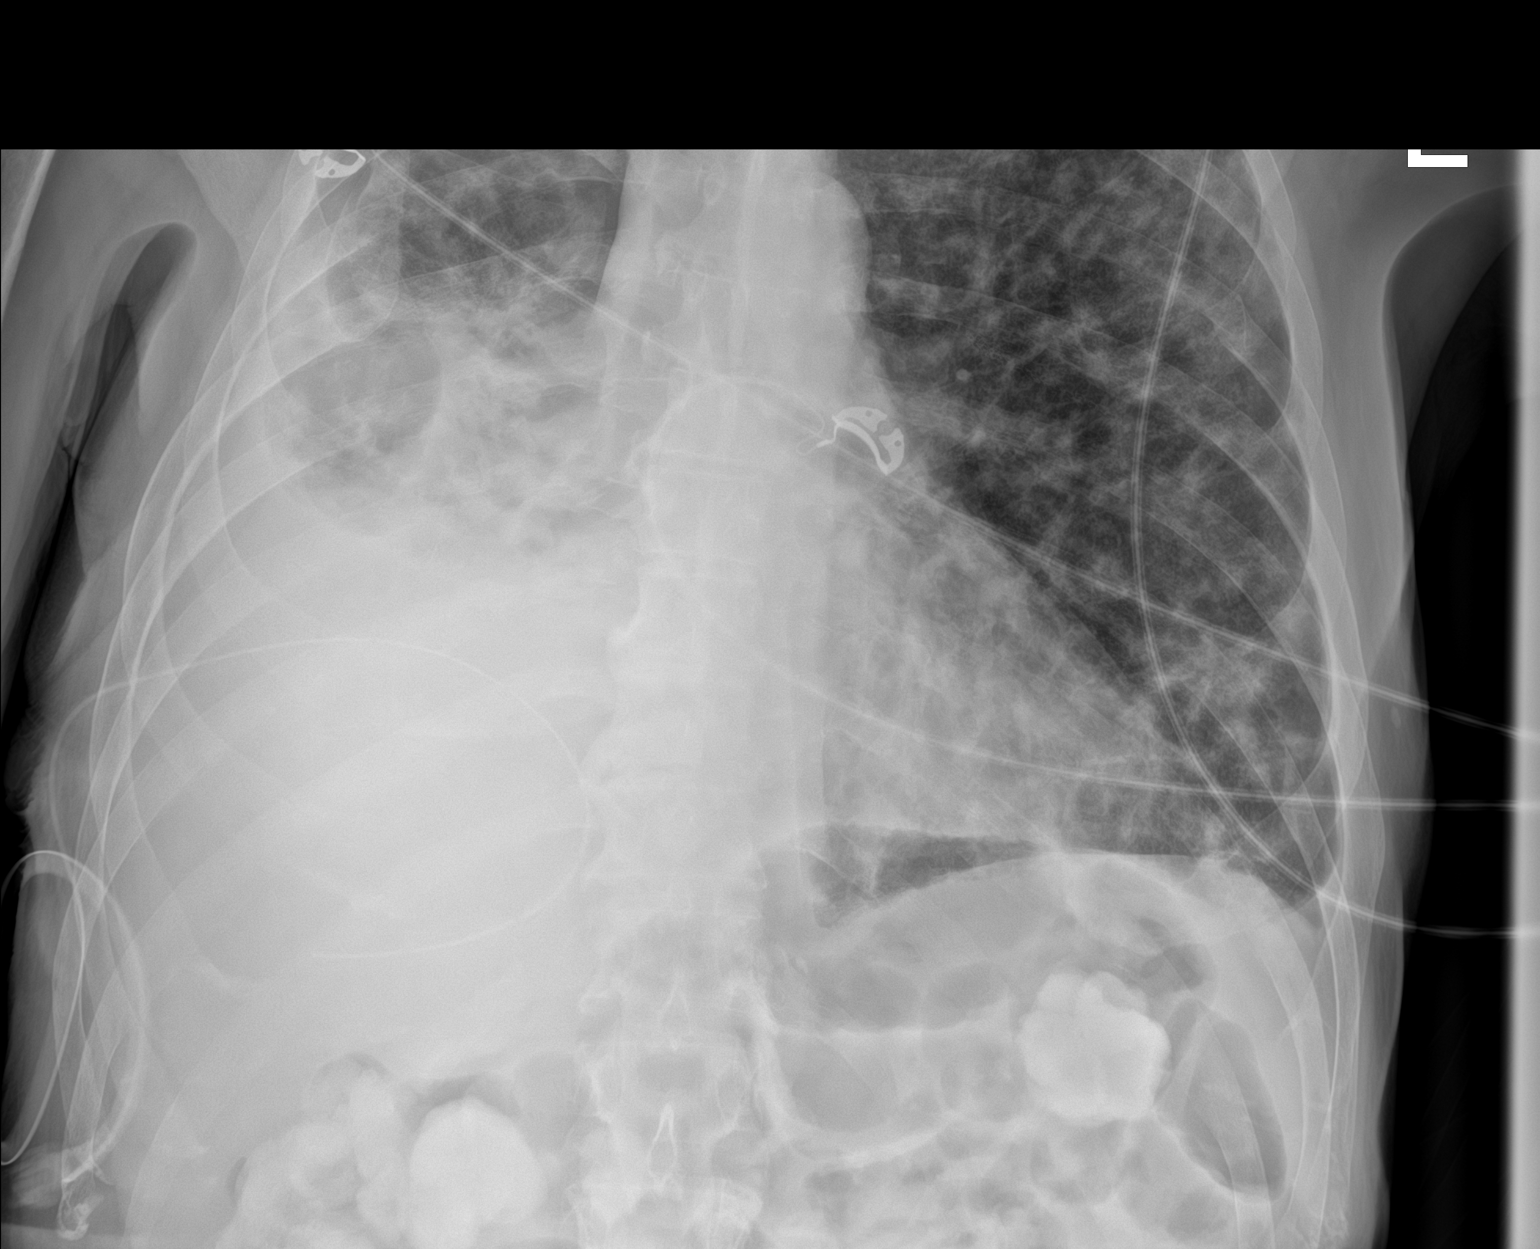

[2 of 2 positions shown; findings below may reference images not displayed]

FINDINGS: 2505 hours. Two views were obtained. The right pleural effusion has
decreased in volume following thoracentesis. Tubing overlies the
lower right chest which may reflect a PleurX catheter. There is a
small right apical pneumothorax which is probably ex vacuo. Multiple
ill-defined nodules are present throughout both lungs consistent
with metastatic disease. The heart size and mediastinal contours are
stable without mediastinal shift. There is an old fracture of the
left 5th rib posteriorly.
IMPRESSION: New small right-sided pneumothorax following thoracentesis and
possible PleurX catheter placement. No evidence of mediastinal
shift. Underlying diffuse pulmonary metastatic disease bilaterally.

Critical Value/emergent results were called by telephone at the time
of interpretation on 07/07/2021 at [DATE] to provider PEBERS FALU ,
who verbally acknowledged these results.

## 2023-04-29 NOTE — Telephone Encounter (Signed)
Telephone call
